# Patient Record
Sex: Male | Born: 1965 | Race: White | Hispanic: No | Smoking: Current every day smoker
Health system: Southern US, Community
[De-identification: ages and names within clinical notes are randomized; demographics above are authoritative.]

## PROBLEM LIST (undated history)

## (undated) ENCOUNTER — Emergency Department (HOSPITAL_COMMUNITY): Discharge status: Absent without leave | Payer: Self-pay

## (undated) DIAGNOSIS — I209 Angina pectoris, unspecified: Secondary | ICD-10-CM

## (undated) DIAGNOSIS — I471 Supraventricular tachycardia, unspecified: Secondary | ICD-10-CM

## (undated) DIAGNOSIS — M109 Gout, unspecified: Secondary | ICD-10-CM

## (undated) DIAGNOSIS — Z720 Tobacco use: Secondary | ICD-10-CM

## (undated) DIAGNOSIS — M1990 Unspecified osteoarthritis, unspecified site: Secondary | ICD-10-CM

## (undated) DIAGNOSIS — I2510 Atherosclerotic heart disease of native coronary artery without angina pectoris: Secondary | ICD-10-CM

## (undated) DIAGNOSIS — I10 Essential (primary) hypertension: Secondary | ICD-10-CM

## (undated) DIAGNOSIS — R51 Headache: Secondary | ICD-10-CM

## (undated) DIAGNOSIS — F419 Anxiety disorder, unspecified: Secondary | ICD-10-CM

## (undated) DIAGNOSIS — K219 Gastro-esophageal reflux disease without esophagitis: Secondary | ICD-10-CM

## (undated) DIAGNOSIS — I4891 Unspecified atrial fibrillation: Secondary | ICD-10-CM

## (undated) DIAGNOSIS — G894 Chronic pain syndrome: Secondary | ICD-10-CM

## (undated) DIAGNOSIS — R0602 Shortness of breath: Secondary | ICD-10-CM

## (undated) DIAGNOSIS — N200 Calculus of kidney: Secondary | ICD-10-CM

## (undated) DIAGNOSIS — F1010 Alcohol abuse, uncomplicated: Secondary | ICD-10-CM

## (undated) HISTORY — DX: Supraventricular tachycardia, unspecified: I47.10

## (undated) HISTORY — DX: Alcohol abuse, uncomplicated: F10.10

## (undated) HISTORY — DX: Tobacco use: Z72.0

## (undated) HISTORY — DX: Supraventricular tachycardia: I47.1

## (undated) HISTORY — PX: KNEE ARTHROPLASTY: SHX992

---

## 2006-03-22 ENCOUNTER — Emergency Department (HOSPITAL_COMMUNITY): Admission: EM | Admit: 2006-03-22 | Discharge: 2006-03-22 | Payer: Self-pay | Admitting: Emergency Medicine

## 2007-08-08 ENCOUNTER — Emergency Department (HOSPITAL_COMMUNITY): Admission: EM | Admit: 2007-08-08 | Discharge: 2007-08-08 | Payer: Self-pay | Admitting: Emergency Medicine

## 2009-05-19 ENCOUNTER — Inpatient Hospital Stay (HOSPITAL_COMMUNITY): Admission: EM | Admit: 2009-05-19 | Discharge: 2009-05-22 | Payer: Self-pay | Admitting: Emergency Medicine

## 2009-05-19 ENCOUNTER — Ambulatory Visit: Payer: Self-pay | Admitting: Internal Medicine

## 2009-05-20 ENCOUNTER — Encounter (INDEPENDENT_AMBULATORY_CARE_PROVIDER_SITE_OTHER): Payer: Self-pay | Admitting: Interventional Cardiology

## 2009-05-20 ENCOUNTER — Encounter: Payer: Self-pay | Admitting: Internal Medicine

## 2009-05-24 ENCOUNTER — Emergency Department (HOSPITAL_COMMUNITY): Admission: EM | Admit: 2009-05-24 | Discharge: 2009-05-24 | Payer: Self-pay | Admitting: Emergency Medicine

## 2009-06-17 DIAGNOSIS — F1010 Alcohol abuse, uncomplicated: Secondary | ICD-10-CM | POA: Insufficient documentation

## 2009-07-08 ENCOUNTER — Encounter (INDEPENDENT_AMBULATORY_CARE_PROVIDER_SITE_OTHER): Payer: Self-pay | Admitting: *Deleted

## 2009-11-10 ENCOUNTER — Emergency Department (HOSPITAL_COMMUNITY): Admission: EM | Admit: 2009-11-10 | Discharge: 2009-11-10 | Payer: Self-pay | Admitting: Family Medicine

## 2010-03-15 ENCOUNTER — Emergency Department (HOSPITAL_COMMUNITY): Admission: EM | Admit: 2010-03-15 | Discharge: 2010-03-15 | Payer: Self-pay | Admitting: Family Medicine

## 2010-03-18 ENCOUNTER — Emergency Department (HOSPITAL_COMMUNITY): Admission: EM | Admit: 2010-03-18 | Discharge: 2010-03-18 | Payer: Self-pay | Admitting: Emergency Medicine

## 2010-06-30 ENCOUNTER — Inpatient Hospital Stay (HOSPITAL_COMMUNITY): Admission: EM | Admit: 2010-06-30 | Discharge: 2010-07-02 | Payer: Self-pay | Admitting: Emergency Medicine

## 2010-06-30 ENCOUNTER — Ambulatory Visit: Payer: Self-pay | Admitting: Internal Medicine

## 2010-06-30 ENCOUNTER — Encounter (INDEPENDENT_AMBULATORY_CARE_PROVIDER_SITE_OTHER): Payer: Self-pay | Admitting: Interventional Cardiology

## 2010-07-01 HISTORY — PX: CARDIAC CATHETERIZATION: SHX172

## 2010-08-13 ENCOUNTER — Encounter (INDEPENDENT_AMBULATORY_CARE_PROVIDER_SITE_OTHER): Payer: Self-pay | Admitting: *Deleted

## 2010-08-19 ENCOUNTER — Ambulatory Visit: Payer: Self-pay | Admitting: Internal Medicine

## 2010-08-21 ENCOUNTER — Inpatient Hospital Stay (HOSPITAL_COMMUNITY): Admission: EM | Admit: 2010-08-21 | Discharge: 2010-08-23 | Payer: Self-pay | Admitting: Emergency Medicine

## 2010-08-26 ENCOUNTER — Telehealth: Payer: Self-pay | Admitting: Internal Medicine

## 2010-08-27 ENCOUNTER — Encounter: Payer: Self-pay | Admitting: Internal Medicine

## 2010-08-30 ENCOUNTER — Ambulatory Visit: Payer: Self-pay | Admitting: Internal Medicine

## 2010-08-30 LAB — CONVERTED CEMR LAB
BUN: 16 mg/dL (ref 6–23)
Basophils Absolute: 0.1 10*3/uL (ref 0.0–0.1)
Basophils Relative: 0.9 % (ref 0.0–3.0)
CO2: 25 meq/L (ref 19–32)
Calcium: 9.6 mg/dL (ref 8.4–10.5)
Chloride: 103 meq/L (ref 96–112)
Creatinine, Ser: 1.2 mg/dL (ref 0.4–1.5)
Eosinophils Absolute: 0.1 10*3/uL (ref 0.0–0.7)
Eosinophils Relative: 1 % (ref 0.0–5.0)
GFR calc non Af Amer: 71.23 mL/min (ref >60)
Glucose, Bld: 88 mg/dL (ref 70–99)
HCT: 45 % (ref 39.0–52.0)
Hemoglobin: 15.8 g/dL (ref 13.0–17.0)
INR: 0.9 (ref 0.8–1.0)
Lymphocytes Relative: 32.8 % (ref 12.0–46.0)
Lymphs Abs: 2.1 10*3/uL (ref 0.7–4.0)
MCHC: 35.2 g/dL (ref 30.0–36.0)
MCV: 95.4 fL (ref 78.0–100.0)
Monocytes Absolute: 0.4 10*3/uL (ref 0.1–1.0)
Monocytes Relative: 6.7 % (ref 3.0–12.0)
Neutro Abs: 3.7 10*3/uL (ref 1.4–7.7)
Neutrophils Relative %: 58.6 % (ref 43.0–77.0)
Platelets: 378 10*3/uL (ref 150.0–400.0)
Potassium: 4.8 meq/L (ref 3.5–5.1)
Prothrombin Time: 10 s (ref 9.7–11.8)
RBC: 4.71 M/uL (ref 4.22–5.81)
RDW: 12.8 % (ref 11.5–14.6)
Sodium: 138 meq/L (ref 135–145)
WBC: 6.3 10*3/uL (ref 4.5–10.5)
aPTT: 27.9 s (ref 21.7–28.8)

## 2010-08-31 ENCOUNTER — Telehealth: Payer: Self-pay | Admitting: Internal Medicine

## 2010-09-01 ENCOUNTER — Ambulatory Visit: Payer: Self-pay | Admitting: Internal Medicine

## 2010-09-01 ENCOUNTER — Observation Stay (HOSPITAL_COMMUNITY): Admission: RE | Admit: 2010-09-01 | Discharge: 2010-09-02 | Payer: Self-pay | Admitting: Internal Medicine

## 2010-09-06 ENCOUNTER — Telehealth: Payer: Self-pay | Admitting: Internal Medicine

## 2010-09-14 ENCOUNTER — Emergency Department (HOSPITAL_COMMUNITY): Admission: EM | Admit: 2010-09-14 | Discharge: 2010-09-14 | Payer: Self-pay | Admitting: Emergency Medicine

## 2010-10-05 ENCOUNTER — Encounter (HOSPITAL_COMMUNITY)
Admission: RE | Admit: 2010-10-05 | Discharge: 2010-12-18 | Payer: Self-pay | Source: Home / Self Care | Attending: Interventional Cardiology | Admitting: Interventional Cardiology

## 2010-11-25 ENCOUNTER — Inpatient Hospital Stay (HOSPITAL_COMMUNITY): Admission: EM | Admit: 2010-11-25 | Discharge: 2010-08-20 | Payer: Self-pay | Admitting: Emergency Medicine

## 2011-01-18 NOTE — Letter (Signed)
Summary: ELectrophysiology/Ablation Procedure Instructions  [facility], Main Office  [address]   [place], [place] [address]   Phone: [phone]  Fax: [phone]     Electrophysiology/Ablation Procedure Instructions    You are scheduled for a(n) SVT ablation  on 09/01/10 at 10:30am with Dr. Ladona Ridgel.  1.  Please come to the Short Stay Center at [facility] at 8:30am on the day of your procedure.  2.  Come prepared to stay overnight.   Please bring your insurance cards and a list of your medications.  3.  Come to the [place] office on 08/30/10 at 9:30am for lab work.    You do not have to be fasting.  4.  Do not have anything to eat or drink after midnight the night before your procedure.  5.   All of your remaining medications may be taken with a small amount of water.  6.  Educational material received:  Ablation   * Occasionally, EP studies and ablations can become lengthy.  Please make your family aware of this before your procedure starts.  Average time ranges from 2-8 hours for EP studies/ablations.  Your physician will locate your family after the procedure with the results.  * If you have any questions after you get home, please call the office at [phone]. Anselm Pancoast

## 2011-01-18 NOTE — Progress Notes (Signed)
Summary: re ablation date  Phone Note Call from Patient   Caller: Patient Reason for Call: Talk to Nurse Summary of Call: pt calling to see when ablation will be set up-pls call [phone] Initial call taken by: Glynda Jaeger,  August 26, 2010 8:52 AM  Follow-up for Phone Call        CATO/ICE Procedure on 09/01/10 at 10:30  Be at hospital at 8:00am labs on 08/30/10 pt aware Dennis Bast, RN, BSN  August 27, 2010 12:17 PM

## 2011-01-18 NOTE — Letter (Signed)
Summary: Appointment - Missed  [facility], Main Office  [address]   [place], [place] [address]   Phone: [phone]  Fax: [phone]     August 13, 2010 MRN: [id number]   Bobby Horn [address] [place], [place]  [address]   Dear [name],  Our records indicate you missed your appointment on August 17th, 2011 with Dr.       Johney Frame.  It is very important that we reach you to reschedule this appointment. We look forward to participating in your health care needs. Please contact us at the number listed above at your earliest convenience to reschedule this appointment.     Sincerely,    Glass blower/designer

## 2011-01-18 NOTE — Progress Notes (Signed)
Summary: test results  Phone Note Call from Patient Call back at Home Phone [phone] Call back at [phone]   Caller: Spouse- joyce Reason for Call: Talk to Nurse, Lab or Test Results Initial call taken by: Lorne Skeens,  August 31, 2010 11:55 AM  Follow-up for Phone Call        The pt and his wife were concerned about lab results. They wanted to make sure everything was ok before they went to the hospital tomorrow. I explained Kelly-RN for Dr. Ladona Ridgel had signed off. Everything looks ok for tomorrow.  Follow-up by: Sherri Rad, RN, BSN,  August 31, 2010 11:59 AM

## 2011-01-18 NOTE — Progress Notes (Signed)
Summary: having panic attacks/heart flutters  Phone Note Call from Patient   Caller: Patient Reason for Call: Talk to Nurse Summary of Call: pt calling re having a lot of panic attacks and heart fluttering pls advise [phone] Initial call taken by: Glynda Jaeger,  September 06, 2010 9:53 AM  Follow-up for Phone Call           pt c/ofluttering in chest and anxiety a week ago when Dr Ladona Ridgel was off.  He says this is going on several times a week and wants to talk to Dr Ladona Ridgel about it.  He signed himself out after his ablation AMA.  He is Dr Michaelle Copas pt for primary cardiology.  I told him I would talk with Dr Ladona Ridgel when he returned from vacation  09/13/10.  Pt verbalized understanding  Dennis Bast, RN  September 06, 2010 12:15 PM  pt calling back to check on this phone [address]  September 14, 2010 8:16 AM  pt calling back pt having chest pain Asher Muir Little  September 14, 2010 12:30 PM Spoke with pt and let him know I spoke with Dr Ladona Ridgel yesterday and he would be glad to see him for his fluttering.  However,  today he is c/o CP  he says he feels like he has a weight sitting on his chest.  I instructed him to go to the nearest ER and let Dr. Katrinka Blazing know who is his primary Cardiologist.  Pt understood Dennis Bast, RN, BSN  September 14, 2010 12:47 PM

## 2011-03-03 LAB — PROTIME-INR
INR: 0.98 (ref 0.00–1.49)
INR: 0.99 (ref 0.00–1.49)
Prothrombin Time: 13.2 seconds (ref 11.6–15.2)
Prothrombin Time: 13.3 seconds (ref 11.6–15.2)

## 2011-03-03 LAB — COMPREHENSIVE METABOLIC PANEL
ALT: 32 U/L (ref 0–53)
AST: 24 U/L (ref 0–37)
Albumin: 4.3 g/dL (ref 3.5–5.2)
Alkaline Phosphatase: 57 U/L (ref 39–117)
BUN: 19 mg/dL (ref 6–23)
BUN: 16 mg/dL (ref 6–23)
CO2: 24 mEq/L (ref 19–32)
CO2: 25 mEq/L (ref 19–32)
Calcium: 9.1 mg/dL (ref 8.4–10.5)
Calcium: 8.2 mg/dL — ABNORMAL LOW (ref 8.4–10.5)
Chloride: 109 mEq/L (ref 96–112)
Creatinine, Ser: 1.31 mg/dL (ref 0.4–1.5)
Creatinine, Ser: 1.04 mg/dL (ref 0.4–1.5)
GFR calc Af Amer: >60 mL/min (ref >60)
GFR calc non Af Amer: 59 mL/min — ABNORMAL LOW (ref >60)
GFR calc non Af Amer: >60 mL/min (ref >60)
Glucose, Bld: 102 mg/dL — ABNORMAL HIGH (ref 70–99)
Glucose, Bld: 92 mg/dL (ref 70–99)
Potassium: 3.8 mEq/L (ref 3.5–5.1)
Sodium: 140 mEq/L (ref 135–145)
Total Bilirubin: 0.5 mg/dL (ref 0.3–1.2)
Total Protein: 7.4 g/dL (ref 6.0–8.3)
Total Protein: 6 g/dL (ref 6.0–8.3)

## 2011-03-03 LAB — CARDIAC PANEL(CRET KIN+CKTOT+MB+TROPI)
CK, MB: 1.9 ng/mL (ref 0.3–4.0)
Relative Index: 1.7 (ref 0.0–2.5)
Total CK: 103 U/L (ref 7–232)
Troponin I: 0.02 ng/mL (ref 0.00–0.06)

## 2011-03-03 LAB — DIFFERENTIAL
Basophils Absolute: 0 10*3/uL (ref 0.0–0.1)
Basophils Relative: 0 % (ref 0–1)
Eosinophils Absolute: 0.1 10*3/uL (ref 0.0–0.7)
Eosinophils Relative: 1 % (ref 0–5)
Lymphocytes Relative: 26 % (ref 12–46)
Lymphs Abs: 2.9 10*3/uL (ref 0.7–4.0)
Monocytes Absolute: 0.8 10*3/uL (ref 0.1–1.0)
Monocytes Relative: 7 % (ref 3–12)
Neutro Abs: 7.5 10*3/uL (ref 1.7–7.7)
Neutrophils Relative %: 66 % (ref 43–77)

## 2011-03-03 LAB — RAPID URINE DRUG SCREEN, HOSP PERFORMED
Amphetamines: NOT DETECTED
Amphetamines: NOT DETECTED
Barbiturates: NOT DETECTED
Benzodiazepines: POSITIVE — AB
Benzodiazepines: POSITIVE — AB
Cocaine: NOT DETECTED
Cocaine: NOT DETECTED
Opiates: POSITIVE — AB
Tetrahydrocannabinol: NOT DETECTED
Tetrahydrocannabinol: NOT DETECTED

## 2011-03-03 LAB — CK TOTAL AND CKMB (NOT AT ARMC)
CK, MB: 1.3 ng/mL (ref 0.3–4.0)
CK, MB: 2.1 ng/mL (ref 0.3–4.0)
Relative Index: 1.5 (ref 0.0–2.5)
Relative Index: INVALID (ref 0.0–2.5)
Total CK: 137 U/L (ref 7–232)
Total CK: 70 U/L (ref 7–232)

## 2011-03-03 LAB — URINALYSIS, ROUTINE W REFLEX MICROSCOPIC
Bilirubin Urine: NEGATIVE
Glucose, UA: NEGATIVE mg/dL
Hgb urine dipstick: NEGATIVE
Ketones, ur: NEGATIVE mg/dL
Nitrite: NEGATIVE
Protein, ur: NEGATIVE mg/dL
Specific Gravity, Urine: 1.035 — ABNORMAL HIGH (ref 1.005–1.030)
Urobilinogen, UA: 0.2 mg/dL (ref 0.0–1.0)
pH: 5 (ref 5.0–8.0)

## 2011-03-03 LAB — CBC
HCT: 42.6 % (ref 39.0–52.0)
HCT: 40.1 % (ref 39.0–52.0)
HCT: 43.3 % (ref 39.0–52.0)
Hemoglobin: 15 g/dL (ref 13.0–17.0)
Hemoglobin: 14 g/dL (ref 13.0–17.0)
Hemoglobin: 15.7 g/dL (ref 13.0–17.0)
MCH: 32.7 pg (ref 26.0–34.0)
MCH: 33.3 pg (ref 26.0–34.0)
MCH: 33.5 pg (ref 26.0–34.0)
MCH: 34.1 pg — ABNORMAL HIGH (ref 26.0–34.0)
MCHC: 35.2 g/dL (ref 30.0–36.0)
MCHC: 34.9 g/dL (ref 30.0–36.0)
MCHC: 35.5 g/dL (ref 30.0–36.0)
MCHC: 36.3 g/dL — ABNORMAL HIGH (ref 30.0–36.0)
MCV: 92.8 fL (ref 78.0–100.0)
MCV: 93.9 fL (ref 78.0–100.0)
MCV: 94.3 fL (ref 78.0–100.0)
MCV: 95.2 fL (ref 78.0–100.0)
Platelets: 245 10*3/uL (ref 150–400)
Platelets: 172 10*3/uL (ref 150–400)
Platelets: 221 10*3/uL (ref 150–400)
RBC: 4.59 MIL/uL (ref 4.22–5.81)
RBC: 4.6 MIL/uL (ref 4.22–5.81)
RBC: 4.61 MIL/uL (ref 4.22–5.81)
RDW: 12.9 % (ref 11.5–15.5)
RDW: 12.6 % (ref 11.5–15.5)
RDW: 12.8 % (ref 11.5–15.5)
WBC: 6.5 10*3/uL (ref 4.0–10.5)
WBC: 11.3 10*3/uL — ABNORMAL HIGH (ref 4.0–10.5)

## 2011-03-03 LAB — POCT CARDIAC MARKERS
CKMB, poc: <1 ng/mL — ABNORMAL LOW (ref 1.0–8.0)
CKMB, poc: 1.2 ng/mL (ref 1.0–8.0)
CKMB, poc: <1 ng/mL — ABNORMAL LOW (ref 1.0–8.0)
Myoglobin, poc: 75.7 ng/mL (ref 12–200)
Myoglobin, poc: 54.7 ng/mL (ref 12–200)
Myoglobin, poc: 68.3 ng/mL (ref 12–200)
Myoglobin, poc: 73.6 ng/mL (ref 12–200)
Troponin i, poc: <0.05 ng/mL (ref 0.00–0.09)
Troponin i, poc: <0.05 ng/mL (ref 0.00–0.09)
Troponin i, poc: <0.05 ng/mL (ref 0.00–0.09)
Troponin i, poc: <0.05 ng/mL (ref 0.00–0.09)

## 2011-03-03 LAB — URIC ACID: Uric Acid, Serum: 4.9 mg/dL (ref 4.0–7.8)

## 2011-03-03 LAB — BASIC METABOLIC PANEL
BUN: 11 mg/dL (ref 6–23)
BUN: 19 mg/dL (ref 6–23)
CO2: 25 mEq/L (ref 19–32)
CO2: 25 mEq/L (ref 19–32)
Calcium: 8.9 mg/dL (ref 8.4–10.5)
Calcium: 9 mg/dL (ref 8.4–10.5)
Chloride: 107 mEq/L (ref 96–112)
Chloride: 109 mEq/L (ref 96–112)
Creatinine, Ser: 0.88 mg/dL (ref 0.4–1.5)
Creatinine, Ser: 1.54 mg/dL — ABNORMAL HIGH (ref 0.4–1.5)
GFR calc Af Amer: 60 mL/min — ABNORMAL LOW (ref >60)
GFR calc Af Amer: >60 mL/min (ref >60)
GFR calc non Af Amer: 49 mL/min — ABNORMAL LOW (ref >60)
Glucose, Bld: 108 mg/dL — ABNORMAL HIGH (ref 70–99)
Potassium: 4.2 mEq/L (ref 3.5–5.1)
Sodium: 139 mEq/L (ref 135–145)

## 2011-03-03 LAB — POCT I-STAT, CHEM 8
BUN: 14 mg/dL (ref 6–23)
Calcium, Ion: 1.13 mmol/L (ref 1.12–1.32)
Chloride: 109 mEq/L (ref 96–112)
Glucose, Bld: 86 mg/dL (ref 70–99)
HCT: 47 % (ref 39.0–52.0)
TCO2: 24 mmol/L (ref 0–100)

## 2011-03-03 LAB — BRAIN NATRIURETIC PEPTIDE: Pro B Natriuretic peptide (BNP): <30 pg/mL (ref 0.0–100.0)

## 2011-03-03 LAB — TROPONIN I
Troponin I: 0.01 ng/mL (ref 0.00–0.06)
Troponin I: 0.02 ng/mL (ref 0.00–0.06)

## 2011-03-03 LAB — MRSA PCR SCREENING: MRSA by PCR: NEGATIVE

## 2011-03-03 LAB — WOUND CULTURE

## 2011-03-03 LAB — MAGNESIUM: Magnesium: 2.1 mg/dL (ref 1.5–2.5)

## 2011-03-05 LAB — CARDIAC PANEL(CRET KIN+CKTOT+MB+TROPI)
CK, MB: 5.6 ng/mL — ABNORMAL HIGH (ref 0.3–4.0)
Total CK: 133 U/L (ref 7–232)
Total CK: 212 U/L (ref 7–232)
Troponin I: 0.78 ng/mL (ref 0.00–0.06)

## 2011-03-06 LAB — CBC
HCT: 54.1 % — ABNORMAL HIGH (ref 39.0–52.0)
MCH: 33.5 pg (ref 26.0–34.0)
MCV: 96.4 fL (ref 78.0–100.0)
Platelets: 266 10*3/uL (ref 150–400)
RDW: 12.9 % (ref 11.5–15.5)

## 2011-03-06 LAB — DIFFERENTIAL
Basophils Absolute: 0.1 10*3/uL (ref 0.0–0.1)
Basophils Relative: 1 % (ref 0–1)
Eosinophils Relative: 2 % (ref 0–5)
Lymphocytes Relative: 37 % (ref 12–46)

## 2011-03-06 LAB — COMPREHENSIVE METABOLIC PANEL
AST: 24 U/L (ref 0–37)
Alkaline Phosphatase: 60 U/L (ref 39–117)
BUN: 15 mg/dL (ref 6–23)
CO2: 20 mEq/L (ref 19–32)
Chloride: 108 mEq/L (ref 96–112)
Creatinine, Ser: 1.67 mg/dL — ABNORMAL HIGH (ref 0.4–1.5)
GFR calc Af Amer: 55 mL/min — ABNORMAL LOW (ref >60)
GFR calc non Af Amer: 45 mL/min — ABNORMAL LOW (ref >60)
Potassium: 4 mEq/L (ref 3.5–5.1)
Total Bilirubin: 0.7 mg/dL (ref 0.3–1.2)

## 2011-03-06 LAB — TROPONIN I: Troponin I: 0.02 ng/mL (ref 0.00–0.06)

## 2011-03-06 LAB — CARDIAC PANEL(CRET KIN+CKTOT+MB+TROPI)
Relative Index: INVALID (ref 0.0–2.5)
Total CK: 86 U/L (ref 7–232)

## 2011-03-06 LAB — BASIC METABOLIC PANEL
CO2: 23 mEq/L (ref 19–32)
Chloride: 106 mEq/L (ref 96–112)
GFR calc Af Amer: >60 mL/min (ref >60)
Potassium: 4 mEq/L (ref 3.5–5.1)
Sodium: 134 mEq/L — ABNORMAL LOW (ref 135–145)

## 2011-03-28 LAB — POCT I-STAT, CHEM 8
Glucose, Bld: 91 mg/dL (ref 70–99)
HCT: 44 % (ref 39.0–52.0)
Hemoglobin: 15 g/dL (ref 13.0–17.0)
Potassium: 4.5 mEq/L (ref 3.5–5.1)
Sodium: 140 mEq/L (ref 135–145)

## 2011-03-28 LAB — BASIC METABOLIC PANEL
BUN: 20 mg/dL (ref 6–23)
CO2: 22 mEq/L (ref 19–32)
Calcium: 10.2 mg/dL (ref 8.4–10.5)
Calcium: 9 mg/dL (ref 8.4–10.5)
Chloride: 111 mEq/L (ref 96–112)
Creatinine, Ser: 1.1 mg/dL (ref 0.4–1.5)
GFR calc Af Amer: 47 mL/min — ABNORMAL LOW (ref >60)
GFR calc Af Amer: >60 mL/min (ref >60)
GFR calc non Af Amer: 39 mL/min — ABNORMAL LOW (ref >60)
GFR calc non Af Amer: >60 mL/min (ref >60)
Glucose, Bld: 90 mg/dL (ref 70–99)
Potassium: 4.1 mEq/L (ref 3.5–5.1)
Sodium: 140 mEq/L (ref 135–145)
Sodium: 144 mEq/L (ref 135–145)

## 2011-03-28 LAB — T4, FREE: Free T4: 0.83 ng/dL (ref 0.80–1.80)

## 2011-03-28 LAB — CBC
Hemoglobin: 16.8 g/dL (ref 13.0–17.0)
MCHC: 34.9 g/dL (ref 30.0–36.0)
MCV: 94.7 fL (ref 78.0–100.0)
RBC: 5.14 MIL/uL (ref 4.22–5.81)
RDW: 12.8 % (ref 11.5–15.5)
RDW: 12.9 % (ref 11.5–15.5)

## 2011-03-28 LAB — DIFFERENTIAL
Basophils Absolute: 0 10*3/uL (ref 0.0–0.1)
Basophils Absolute: 0 10*3/uL (ref 0.0–0.1)
Basophils Relative: 0 % (ref 0–1)
Eosinophils Absolute: 0.1 10*3/uL (ref 0.0–0.7)
Eosinophils Relative: 1 % (ref 0–5)
Lymphocytes Relative: 21 % (ref 12–46)
Lymphocytes Relative: 23 % (ref 12–46)
Lymphs Abs: 1.7 10*3/uL (ref 0.7–4.0)
Monocytes Absolute: 0.5 10*3/uL (ref 0.1–1.0)
Monocytes Absolute: 0.5 10*3/uL (ref 0.1–1.0)
Monocytes Relative: 7 % (ref 3–12)
Neutro Abs: 5.1 10*3/uL (ref 1.7–7.7)
Neutro Abs: 7.4 10*3/uL (ref 1.7–7.7)
Neutrophils Relative %: 69 % (ref 43–77)
Neutrophils Relative %: 74 % (ref 43–77)

## 2011-03-28 LAB — POCT CARDIAC MARKERS
CKMB, poc: 1.2 ng/mL (ref 1.0–8.0)
CKMB, poc: 1.6 ng/mL (ref 1.0–8.0)
Myoglobin, poc: 340 ng/mL (ref 12–200)
Troponin i, poc: <0.05 ng/mL (ref 0.00–0.09)

## 2011-03-28 LAB — CARDIAC PANEL(CRET KIN+CKTOT+MB+TROPI)
CK, MB: 8.4 ng/mL — ABNORMAL HIGH (ref 0.3–4.0)
CK, MB: 8.5 ng/mL — ABNORMAL HIGH (ref 0.3–4.0)
Relative Index: 5.2 — ABNORMAL HIGH (ref 0.0–2.5)
Relative Index: 5.5 — ABNORMAL HIGH (ref 0.0–2.5)
Total CK: 154 U/L (ref 7–232)
Total CK: 164 U/L (ref 7–232)
Troponin I: 0.49 ng/mL — ABNORMAL HIGH (ref 0.00–0.06)

## 2011-03-28 LAB — PROTIME-INR
INR: 0.9 (ref 0.00–1.49)
INR: 1 (ref 0.00–1.49)
Prothrombin Time: 13.5 seconds (ref 11.6–15.2)

## 2011-03-28 LAB — APTT: aPTT: 23 seconds — ABNORMAL LOW (ref 24–37)

## 2011-03-28 LAB — RAPID URINE DRUG SCREEN, HOSP PERFORMED
Barbiturates: NOT DETECTED
Benzodiazepines: POSITIVE — AB

## 2011-03-28 LAB — CK TOTAL AND CKMB (NOT AT ARMC)
CK, MB: 4.4 ng/mL — ABNORMAL HIGH (ref 0.3–4.0)
Total CK: 165 U/L (ref 7–232)

## 2011-03-28 LAB — TROPONIN I: Troponin I: 0.09 ng/mL — ABNORMAL HIGH (ref 0.00–0.06)

## 2011-04-19 DIAGNOSIS — N200 Calculus of kidney: Secondary | ICD-10-CM

## 2011-04-19 HISTORY — DX: Calculus of kidney: N20.0

## 2011-05-03 NOTE — Op Note (Signed)
NAME:  Bobby Horn, Bobby Horn NO.:  [id number]   MEDICAL RECORD NO.:  [id number]          PATIENT TYPE:  INP   LOCATION:  2917                         FACILITY:  [facility]   PHYSICIAN:  Duke Salvia, MD, FACCDATE OF BIRTH:  1966-04-18   DATE OF PROCEDURE:  05/21/2009  DATE OF DISCHARGE:                               OPERATIVE REPORT   PREOPERATIVE DIAGNOSIS:  Recurrent supraventricular tachycardia with a  left bundle-branch block morphology.   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   PROCEDURES:  Invasive electrophysiological study, arrhythmia mapping,  catheter ablation, isoproterenol infusion, and ibutilide infusion.   DESCRIPTION OF PROCEDURE:  Following obtaining informed consent, the  patient was brought to the electrophysiology laboratory and placed on  the fluoroscopic table in the supine position.  After routine prep and  drape, cardiac catheterization was performed with local anesthesia and  conscious sedation.  Noninvasive blood pressure monitoring and  transcutaneous oxygen saturation monitoring were performed continuously  throughout the procedure.  Following the procedure, the catheters were  removed, and hemostasis was obtained.  The patient was transferred to  the holding area in stable condition.   CATHETERS:  A 5-French quadripolar catheter was inserted via the left  femoral vein to the AV junction.  A 5-French quadripolar catheter was inserted via the left femoral vein  to the right ventricular apex.  A 6-French octapolar catheter was inserted via the right femoral vein to  the coronary sinus.  A 7-French 5-mm deflectable tip ablation catheter was inserted via the  right femoral artery to mapping sites in the mitral annulus both on the  ventricular side as well as the atrial side.   Surface leads I, aVF, and V1 were monitored continuously  throughout the  procedure.  Following insertion of the catheters, a stimulation protocol  included:  Incremental atrial pacing.  Incremental ventricular pacing.  Single atrial extrastimuli at paced cycle lengths of 500 msec.  Single and double ventricular extrastimuli paced cycle length of 500  msec and single ventricular extrastimuli paced cycle length of 400 msec.  Ventricular pacing during tachycardia.   END-TIDAL RESULTS:  End-tidal surface electrocardiogram  Initial:  Rhythm:  Sinus; RR interval 942 msec; PR interval 152 msec; QRS duration  107 msec; QT interval 465 msec; P wave duration 134 msec; bundle-branch  block:  Absent; pre-excitation:  Absent.  AH interval 60 msec; HV interval 50 msec.  Final:  Rhythm:  Sinus; RR interval 759 msec; PR interval:  126 msec; QRS  duration 142 msec; QT interval 474 msec; P wave duration 107 msec; pre-  excitation:  Absent; bundle-branch block:  Present - right;  AH interval:  70 msec; HV interval:  53 msec.  AV nodal function:  AV Wenckebach was 350 msec.  VA Wenckebach was 400 msec with refractoriness of the accessory pathway  seen below that.  AV nodal conduction was continuous retrograde preablation.   Accessory pathway function; the left lateral accessory pathway just at  the site 6-8 was identified.  It was not initially  apparent with  ventricular pacing and was seen initially only upon refractoriness of  the AV node at 400 msec.  Isoproterenol infusion allowed [place] to induce  and sustain tachycardia.  The patient was also mapped during ventricular  pacing of the atrial insertion.   Tachycardia when induced had a cycle length of 400-427 msec and was  inducible with coronary sinus pacing at 500:  300 - 280 msec.  It  terminated spontaneously.   1. Arrhythmia.  2. Paroxysmal atrial fibrillation occurred on 2 occasions, both during      ventricular pacing and atrial insertion mapping.  After persistence      of greater than 10  minutes, ibutilide was given on 2 separate      occasions, which resulted in termination of atrial fibrillation on      the first occasion in the post-infusion period and in the second      infusion, mid infusion.  The QTc interval during ibutilide      lengthened to 550 msec.   Fluoroscopy time, a total of 6 minutes and 21 seconds of fluoroscopy  time was utilized at 7.5 frames per second.   Radiofrequency energy, a total of 2 minutes and 13 seconds of RF was  applied to sites where atrial activation was quite early.  With the  fifth application, which was a 30-second application, VA conduction was  interrupted.  We then gave a 60-second consolidation burn at various  proximal sites prior to catheter removal.   IMPRESSION:  1. Normal sinus function.  2. Normal atrial function.  3. Normal atrioventricular nodal function.  4. Normal His-Purkinje system function.  5. Accessory pathway identified at left lateral just at the site 6-8      with successful interruption of retrograde conduction and      elimination of the substrate of the patient's arrhythmia.  6. Normal ventricular response to programmed stimulation.   SUMMARY:  In conclusion, the results of electrophysiological testing  demonstrated a left lateral accessory pathway mediated in the patient's  clinical arrhythmia.  Catheter ablation was successfully accomplished on  the atrial insertion.  The procedure was complicated by recurrent  episodes of atrial fibrillation requiring ibutilide infusion associated  with QT prolongation to the 550 msec range.  Because of that the patient  will be observed in the Step-Down Unit for 4 hours prior to transfer to  the floor.   The patient tolerated the procedure without apparent complications other  than that.      Duke Salvia, MD, [facility]  Electronically Signed    SCK/MEDQ  D:  05/21/2009  T:  05/21/2009  Job:  [id number]

## 2011-05-03 NOTE — Consult Note (Signed)
NAME:  Bobby Horn, Bobby Horn NO.:  [id number]   MEDICAL RECORD NO.:  [id number]          PATIENT TYPE:  INP   LOCATION:  2008                         FACILITY:  [facility]   PHYSICIAN:  Hillis Range, MD       DATE OF BIRTH:  1966-09-20   DATE OF CONSULTATION:  05/20/2009  DATE OF DISCHARGE:                                 CONSULTATION   REQUESTING PHYSICIAN:  Lyn Records, MD   REASON FOR CONSULTATION:  Wide complex tachycardia.   HISTORY OF PRESENT ILLNESS:  Bobby Horn is a 45 year old gentleman with  a history of supraventricular tachycardia who was admitted with  symptomatic wide complex tachycardia.  The patient reports that since  his childhood, he has had frequent episodes of abrupt onset and  termination of heart racing.  He describes a regular tachycardia with  symptoms of diaphoresis, dizziness, chest discomfort, and presyncope.  Episodes often terminate with vagal maneuvers.  He has previously tried  beta-blockers but was unable to tolerate these medications due to  fatigue.  He reports that episodes typically are precipitated by  exercise.  He is unaware of any other triggers for these episodes.  He  reports increasing frequency and duration of episodes recently.  Presently, episodes occur several times per week.  He notes that he has  previously been diagnosed with supraventricular tachycardia and has had  documented heart rates up to 200 beats per minute with symptoms of  presyncope.  His most recent episode occurred on May 19, 2009 at which  time he developed sustained tachycardia with dizziness and chest  discomfort.  He presented to [facility] Emergency Department where he  was observed to have a wide complex tachycardia of a left bundle-branch  morphology with a ventricular rate of 190 beats per minute.  He was  cardioverted to sinus rhythm.  He has subsequently done very well  without any symptoms of chest pain, shortness of breath, palpitations,  dizziness, presyncope, or syncope.  Presently, he is asymptomatic and  has baseline state of health.  He reports that he is typically very  active and works as a Designer, fashion/clothing.   PAST MEDICAL HISTORY:  1. Supraventricular tachycardia.  2. Alcohol abuse.  3. Tobacco abuse.   SOCIAL HISTORY:  The patient lives in [place] with his wife.  He  drinks a six-pack of beer most days.  He smokes 1 pack per day and  denies drug use.  He works as a Designer, fashion/clothing.   FAMILY HISTORY:  Notable for a mother with a irregular heartbeat.  The  patient is unaware of his father's family history.   ALLERGIES:  BETA-BLOCKERS cause fatigue and erectile dysfunction.  He  has no other allergies.   MEDICATIONS:  1. Aspirin 325 mg daily.  2. Diltiazem 60 mg q.6 h.  3. Xanax p.r.n.  4. Darvocet p.r.n.   REVIEW OF SYSTEMS:  All systems were reviewed and negative except as  outlined in the HPI above.   PHYSICAL EXAMINATION:  VITAL SIGNS:  Telemetry reveals sinus rhythm.  Blood pressure 141/89, heart rate 71, respirations 18,  and sats 100% on  room air.  GENERAL:  The patient is a well-appearing male in no acute distress.  He  is alert and oriented x3.  HEENT:  Normocephalic and atraumatic.  Sclerae clear.  Conjunctivae  pink.  Oropharynx clear.  NECK:  Supple.  No thyromegaly, JVD, or bruits.  LUNGS:  Clear to auscultation bilaterally.  HEART:  Regular rate and rhythm.  No murmurs, rubs, or gallops.  GI:  Soft, nontender, and nondistended.  Positive bowel sounds.  EXTREMITIES:  No clubbing, cyanosis, or edema.  NEUROLOGIC:  Cranial nerves II-XII are intact.  Strength and sensation  are intact.  SKIN:  The patient has multiple tattoos.  MUSCULOSKELETAL:  The patient is fit and muscular.  No deformity or  atrophy.  PSYCH:  Euthymic mood.  Full affect.   IMAGING STUDIES:  EKG today reveals sinus rhythm at 66 beats per minute  and is otherwise unremarkable.  EKG from May 19, 2009 at 12:18 reveals a  wide complex  tachycardia with a QRS duration of 142 milliseconds and a  left bundle-branch QRS morphology.   LABORATORY DATA:  INR 0.9.  Creatinine 1.1 and potassium 4.1.  Hematocrit 48 and platelets 232.  Magnesium 2.2.  TSH 0.295.  Peak  troponin 0.54.  CK-MB 8.5 and CK 164.   IMPRESSION:  Bobby Horn is a 45 year old gentleman with a history of  supraventricular tachycardia who was admitted with a wide complex  symptomatic tachycardia.  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  He has  previously failed medical therapy with beta-blockers which he did not  tolerate due to symptoms of fatigue and erectile dysfunction.  At this  point, he is very interested in catheter ablation for his tachycardia.  The patient had chest discomfort during tachycardia and mildly elevated  cardiac markers.  I suspect that his mild elevation in cardiac markers  is due to rapid ventricular rates for a prolonged period of time as well  as cardioversion.  A transthoracic echocardiogram has been ordered and  is pending.  If the patient has a preserved ejection fraction, then we  should proceed on to EP study and radiofrequency ablation for his  tachycardia.  However, should he have a depressed ejection fraction, I  think that cardiac catheterization may be warranted.  I have discussed  this with Dr. Katrinka Blazing who agrees with the plan.  His TSH is also mildly  depressed and we will therefore obtain a free T4.   PLAN:  Therapeutic strategies for tachycardia including both medicines  and catheter-based therapies were discussed in detail with the patient.  Risks, benefits, and alternatives to EP study and radiofrequency  ablation for his tachycardia were also discussed in detail.  The patient  understands that these risks include but are not limited to bleeding,  vascular damage, pericardial effusion with tamponade and perforation,  damage to the normal  conduction system requiring a pacemaker damage to  the valves, stroke, MI, and death.  The patient understands these risks  and wishes to proceed.  We will therefore schedule the patient for EP  study and radiofrequency ablation for his tachycardia at the next  available time.      Hillis Range, MD  Electronically Signed     JA/MEDQ  D:  05/20/2009  T:  05/21/2009  Job:  [id number]

## 2011-05-03 NOTE — Discharge Summary (Signed)
NAMEMarland Kitchen  Bobby Horn, Bobby Horn NO.:  [id number]   MEDICAL RECORD NO.:  [id number]          PATIENT TYPE:  INP   LOCATION:  2917                         FACILITY:  [facility]   PHYSICIAN:  Lyn Records, M.D.   DATE OF BIRTH:  11-17-1966   DATE OF ADMISSION:  05/19/2009  DATE OF DISCHARGE:  05/22/2009                               DISCHARGE SUMMARY   DISCHARGE DIAGNOSES:  1. Supraventricular tachycardia status post successful ablation on      May 21, 2009.  2. Alcohol use, cessation counseling, if no tobacco abuse, cessation      counseling.   HOSPITAL COURSE:  [name] is a 45 year old male patient with a  history of SVT in the past.  He said he saw Dr. Katrinka Blazing about 12 years ago  was treated with a beta-blocker.  He stopped it secondary to erectile  dysfunction.  He did not make a return appointment to go over options  and since that time he has had palpitations.  On the day of admission,  he became dizzy and had more palpitations.  He presented to the  emergency room and was found to be in a wide complex tachycardia.  He  was given Versed and cardioverted back to normal sinus rhythm.   Ultimately, we consulted Dr. Johney Frame of the EP Service and ultimately he  was successfully ablated on May 21, 2009.  He did have some atrial  fibrillation during the EP study but Dr. Johney Frame felt this was not  clinical atrial fib but more likely the results of the EP study.  He  only recommends 325 mg of aspirin daily for 4 weeks with the cessation  of alcohol and tobacco dependence.   On May 22, 2009, this patient was felt to be ready for discharge to  home.   DISCHARGE LABORATORY DATA:  A free T4 of 0.83, sodium 140, potassium  4.1, BUN 20, creatinine 1.1.  His troponin did increase to 0.54 but we  felt this was secondary to the defibrillation that he received in the  emergency room.  Urine drug screen was positive for opiates and  benzodiazepines.  Hemoglobin 16.8, hematocrit 48.3,  white count 10.0,  platelets 232.  Chest x-ray no active disease.   He is being discharged to home in stable but improved condition.   DISCHARGE MEDICATIONS:  1. Enteric-coated aspirin 325 mg a day for the next month.  2. He is to use nicotine patches and we have explained to him how to      buy these at a drug store.  3. He uses Xanax at home and may resume this if he needs to.   DIET:  He is to remain on a low-sodium heart-healthy diet.   DISCHARGE INSTRUCTIONS:  Increase activity slowly.  May shower/bathe.  No lifting for 2 weeks.  Clean puncture site gently with soap and water.  No scrubbing.   FOLLOWUP:  Follow up with Dr. Johney Frame on Thursday June 18, 2009 at 2:15  p.m.  Follow up with Dr. Dellia Cloud, nurse practitioner on June 04, 2009 at  2 p.m.Marland Kitchen  At this point, we need to probably go ahead and  schedule a stress Cardiolite for this man just to assure ourselves  that  his wide complex tachycardia was not ischemic in nature.      Guy Franco, P.A.      Lyn Records, M.D.  Electronically Signed    LB/MEDQ  D:  05/22/2009  T:  05/23/2009  Job:  [id number]   cc:   Hillis Range, MD

## 2011-05-03 NOTE — H&P (Signed)
NAME:  Bobby Horn, Bobby Horn NO.:  [id number]   MEDICAL RECORD NO.:  [id number]          PATIENT TYPE:  INP   LOCATION:  2008                         FACILITY:  [facility]   PHYSICIAN:  Lyn Records, M.D.   DATE OF BIRTH:  15-Feb-1966   DATE OF ADMISSION:  05/19/2009  DATE OF DISCHARGE:                              HISTORY & PHYSICAL   CHIEF COMPLAINT:  Dizziness.   HISTORY OF PRESENT ILLNESS:  Mr. Goar is a 45 year old male patient  with a history of SVT in the past.  He saw Dr. Katrinka Blazing about 12 years ago  for the same, he was treated with a beta-blocker, with the patient has  stopped secondary to erectile dysfunction.  He did not make a return  appointment to go over any further options.  Since that time, he has had  palpitations associated with dizziness.  The symptoms were worse today.  He was brought to the emergency room, was found to be a wide complex  tachycardia and was given Versed and was cardioverted back to normal  sinus rhythm.  He says he does not want to be on beta-blockers.  He has  some chest pain isolated with SVT episodes.   PAST MEDICAL HISTORY:  SVT.   SOCIAL HISTORY:  He lives in [place] with his wife.  He drinks a 6-  pack of beer daily.  Smokes 1 pack per day.  Denies drug use.   FAMILY HISTORY:  Mother had coronary artery disease.   REVIEW OF SYSTEMS:  Presyncope, chest pain, or palpitations.  Review of  systems otherwise negative.   ALLERGIES:  No known drug allergies.   MEDICATIONS:  1. Xanax p.r.n.  2. Aspirin daily.   PHYSICAL EXAMINATION:  VITAL SIGNS:  Pulse 64, respirations 16, blood  pressure 110/72, and O2 saturation is 100% on room air.  GENERAL:  In no acute distress.  HEENT:  Grossly normal.  NECK:  No carotid or subclavian bruits.  No JVD or thyromegaly.  Sclerae  are clear.  Conjunctive normal.  Nares without drainage.  CHEST:  Clear to auscultation bilaterally.  No wheezing or rhonchi.  HEART:  Regular rate and  rhythm.  No murmurs or rub.  ABDOMEN:  Good bowel sounds.  Nontender and nondistended.  No masses, no  bruits.  EXTREMITIES:  No peripheral edema.  SKIN:  Warm and dry.  NEUROLOGIC:  Cranial nerves II through XII grossly intact.  Normal mood  and affect.   Chest x-ray, no active disease.   LABORATORY STUDIES:  Hemoglobin 16.0, hematocrit 48.3, platelets 232,  white count 10.0.  Sodium 144, potassium 4.8, BUN 21, creatinine 1.92,  sodium 99, PT 23, PTT 12.6, INR 0.9, myoglobin 340 which is elevated,  but his troponin is normal.   EKG shows normal sinus rhythm, rate 80, non-acute.   ASSESSMENT AND PLAN:  1. Wide-complex tachycardia, suspect atrium origin.  He did have chest      pain with this episode, he seems to only have chest pain with his      palpitations.  We will go ahead and  place on short-acting calcium      channel blockers.  2. History of SVT.  3. Erectile dysfunction with beta-blockers.   We will admit him, check cardiac isoenzyme, TSH, and a magnesium level.  Check a 2-D echo.  He may ultimately need a stress Cardiolite here at  [facility] at some point.      Guy Franco, P.A.      Lyn Records, M.D.  Electronically Signed    LB/MEDQ  D:  05/19/2009  T:  05/20/2009  Job:  [id number]

## 2011-05-17 ENCOUNTER — Emergency Department (HOSPITAL_COMMUNITY)
Admission: EM | Admit: 2011-05-17 | Discharge: 2011-05-17 | Disposition: A | Discharge status: Home / Self Care | Payer: Self-pay | Attending: Emergency Medicine | Admitting: Emergency Medicine

## 2011-05-17 ENCOUNTER — Emergency Department (HOSPITAL_COMMUNITY): Payer: Self-pay

## 2011-05-17 DIAGNOSIS — N201 Calculus of ureter: Secondary | ICD-10-CM | POA: Insufficient documentation

## 2011-05-17 DIAGNOSIS — F411 Generalized anxiety disorder: Secondary | ICD-10-CM | POA: Insufficient documentation

## 2011-05-17 DIAGNOSIS — N1330 Unspecified hydronephrosis: Secondary | ICD-10-CM | POA: Insufficient documentation

## 2011-05-17 DIAGNOSIS — R112 Nausea with vomiting, unspecified: Secondary | ICD-10-CM | POA: Insufficient documentation

## 2011-05-17 DIAGNOSIS — R109 Unspecified abdominal pain: Secondary | ICD-10-CM | POA: Insufficient documentation

## 2011-05-17 DIAGNOSIS — R000 Tachycardia, unspecified: Secondary | ICD-10-CM | POA: Insufficient documentation

## 2011-05-17 DIAGNOSIS — I4891 Unspecified atrial fibrillation: Secondary | ICD-10-CM | POA: Insufficient documentation

## 2011-05-17 DIAGNOSIS — IMO0002 Reserved for concepts with insufficient information to code with codable children: Secondary | ICD-10-CM | POA: Insufficient documentation

## 2011-05-17 LAB — URINALYSIS, ROUTINE W REFLEX MICROSCOPIC
Bilirubin Urine: NEGATIVE
Glucose, UA: NEGATIVE mg/dL
Specific Gravity, Urine: 1.034 — ABNORMAL HIGH (ref 1.005–1.030)
Urobilinogen, UA: 0.2 mg/dL (ref 0.0–1.0)

## 2011-05-17 LAB — URINE MICROSCOPIC-ADD ON

## 2011-06-28 ENCOUNTER — Encounter: Payer: Self-pay | Admitting: Internal Medicine

## 2011-09-30 LAB — I-STAT 8, (EC8 V) (CONVERTED LAB)
BUN: 25 — ABNORMAL HIGH
Bicarbonate: 23.7
Glucose, Bld: 112 — ABNORMAL HIGH
Sodium: 134 — ABNORMAL LOW
pCO2, Ven: 36.4 — ABNORMAL LOW
pH, Ven: 7.421 — ABNORMAL HIGH

## 2011-09-30 LAB — DIFFERENTIAL
Basophils Absolute: 0.1
Basophils Relative: 1
Eosinophils Absolute: 0.1
Lymphocytes Relative: 14
Lymphs Abs: 1.4
Monocytes Relative: 8
Neutro Abs: 7.7
Neutrophils Relative %: 77

## 2011-09-30 LAB — CBC
RBC: 5.1
WBC: 10.1

## 2011-11-04 ENCOUNTER — Telehealth (HOSPITAL_COMMUNITY): Payer: Self-pay | Admitting: Emergency Medicine

## 2011-11-04 ENCOUNTER — Other Ambulatory Visit: Payer: Self-pay

## 2011-11-04 ENCOUNTER — Encounter (HOSPITAL_COMMUNITY): Payer: Self-pay | Admitting: Emergency Medicine

## 2011-11-04 ENCOUNTER — Observation Stay (HOSPITAL_COMMUNITY)
Admission: EM | Admit: 2011-11-04 | Discharge: 2011-11-06 | Disposition: A | Discharge status: Home / Self Care | Payer: Self-pay | Attending: Internal Medicine | Admitting: Internal Medicine

## 2011-11-04 ENCOUNTER — Emergency Department (HOSPITAL_COMMUNITY): Payer: Self-pay

## 2011-11-04 DIAGNOSIS — R110 Nausea: Secondary | ICD-10-CM | POA: Insufficient documentation

## 2011-11-04 DIAGNOSIS — I10 Essential (primary) hypertension: Secondary | ICD-10-CM | POA: Diagnosis present

## 2011-11-04 DIAGNOSIS — R61 Generalized hyperhidrosis: Secondary | ICD-10-CM | POA: Insufficient documentation

## 2011-11-04 DIAGNOSIS — I471 Supraventricular tachycardia, unspecified: Secondary | ICD-10-CM | POA: Diagnosis present

## 2011-11-04 DIAGNOSIS — I214 Non-ST elevation (NSTEMI) myocardial infarction: Principal | ICD-10-CM

## 2011-11-04 DIAGNOSIS — R079 Chest pain, unspecified: Secondary | ICD-10-CM | POA: Diagnosis present

## 2011-11-04 DIAGNOSIS — I498 Other specified cardiac arrhythmias: Secondary | ICD-10-CM | POA: Insufficient documentation

## 2011-11-04 DIAGNOSIS — Z720 Tobacco use: Secondary | ICD-10-CM

## 2011-11-04 DIAGNOSIS — R0789 Other chest pain: Principal | ICD-10-CM | POA: Diagnosis present

## 2011-11-04 DIAGNOSIS — R0602 Shortness of breath: Secondary | ICD-10-CM | POA: Insufficient documentation

## 2011-11-04 DIAGNOSIS — G894 Chronic pain syndrome: Secondary | ICD-10-CM | POA: Diagnosis present

## 2011-11-04 DIAGNOSIS — F1010 Alcohol abuse, uncomplicated: Secondary | ICD-10-CM | POA: Insufficient documentation

## 2011-11-04 DIAGNOSIS — E876 Hypokalemia: Secondary | ICD-10-CM | POA: Insufficient documentation

## 2011-11-04 DIAGNOSIS — M109 Gout, unspecified: Secondary | ICD-10-CM | POA: Insufficient documentation

## 2011-11-04 HISTORY — DX: Angina pectoris, unspecified: I20.9

## 2011-11-04 HISTORY — DX: Anxiety disorder, unspecified: F41.9

## 2011-11-04 HISTORY — DX: Shortness of breath: R06.02

## 2011-11-04 HISTORY — DX: Calculus of kidney: N20.0

## 2011-11-04 HISTORY — DX: Essential (primary) hypertension: I10

## 2011-11-04 HISTORY — DX: Chronic pain syndrome: G89.4

## 2011-11-04 HISTORY — DX: Unspecified osteoarthritis, unspecified site: M19.90

## 2011-11-04 HISTORY — DX: Atherosclerotic heart disease of native coronary artery without angina pectoris: I25.10

## 2011-11-04 HISTORY — DX: Non-ST elevation (NSTEMI) myocardial infarction: I21.4

## 2011-11-04 HISTORY — DX: Headache: R51

## 2011-11-04 HISTORY — DX: Gastro-esophageal reflux disease without esophagitis: K21.9

## 2011-11-04 LAB — BASIC METABOLIC PANEL
BUN: 16 mg/dL (ref 6–23)
GFR calc non Af Amer: 81 mL/min — ABNORMAL LOW (ref >90)
Glucose, Bld: 115 mg/dL — ABNORMAL HIGH (ref 70–99)
Potassium: 3.4 mEq/L — ABNORMAL LOW (ref 3.5–5.1)

## 2011-11-04 LAB — CBC
HCT: 38.6 % — ABNORMAL LOW (ref 39.0–52.0)
Hemoglobin: 13.8 g/dL (ref 13.0–17.0)
MCHC: 35.8 g/dL (ref 30.0–36.0)
MCV: 92.8 fL (ref 78.0–100.0)

## 2011-11-04 MED ORDER — HYDROMORPHONE HCL PF 1 MG/ML IJ SOLN
1.0000 mg | Freq: Once | INTRAMUSCULAR | Status: AC
  Administered 2011-11-04: 1 mg via INTRAVENOUS
  Filled 2011-11-04: qty 1

## 2011-11-04 MED ORDER — MORPHINE SULFATE 4 MG/ML IJ SOLN
6.0000 mg | Freq: Once | INTRAMUSCULAR | Status: AC
  Administered 2011-11-04: 6 mg via INTRAVENOUS
  Filled 2011-11-04: qty 2

## 2011-11-04 MED ORDER — NITROGLYCERIN 0.4 MG SL SUBL
0.4000 mg | SUBLINGUAL_TABLET | SUBLINGUAL | Status: DC | PRN
  Administered 2011-11-04: 0.4 mg via SUBLINGUAL
  Filled 2011-11-04: qty 25

## 2011-11-04 MED ORDER — ASPIRIN 325 MG PO TABS: 325.0000 mg | ORAL_TABLET | ORAL | Status: DC

## 2011-11-04 MED ORDER — ASPIRIN EC 325 MG PO TBEC
DELAYED_RELEASE_TABLET | ORAL | Status: AC
  Administered 2011-11-04: 325 mg via ORAL
  Filled 2011-11-04: qty 1

## 2011-11-04 MED ORDER — ONDANSETRON HCL 4 MG/2ML IJ SOLN
4.0000 mg | Freq: Once | INTRAMUSCULAR | Status: AC
  Administered 2011-11-04: 4 mg via INTRAVENOUS
  Filled 2011-11-04: qty 2

## 2011-11-04 NOTE — ED Provider Notes (Signed)
History    patient with history of SVT and prior cardiac ablation is presenting to the ED with a chief complaint of chest pain and shortness of breath.  Patient states since the ablation nearly a year and a half ago, he has been having constant left-sided chest pain and shortness of breath with exertion. He described his chest pain is pressure, and describes shortness of breath with increase exertion.  He also complaining of nausea without vomiting. Also has occasional diaphoresis episode. Nothing makes it better or worse. Patient state he has talked to multiple doctors in regard to his symptoms, however he is not receiving adequate care " because I don't have insurance".  Today, his chest pain increase along with the shortness of breath without exertion.    CSN: [id number] Arrival date & time: 11/04/2011  5:04 PM   First MD Initiated Contact with Patient 11/04/11 1800      Chief Complaint  Patient presents with  . Chest Pain    (Consider location/radiation/quality/duration/timing/severity/associated sxs/prior treatment) HPI  Past Medical History  Diagnosis Date  . Supraventricular tachycardia   . Alcohol abuse   . Tobacco abuse     No past surgical history on file.  No family history on file.  History  Substance Use Topics  . Smoking status: Current Everyday Smoker  . Smokeless tobacco: Not on file   Comment: 1 pack per day  . Alcohol Use: Yes     6-pack beer most days      Review of Systems  All other systems reviewed and are negative.    Allergies  Review of patient's allergies indicates no known allergies.  Home Medications   Current Outpatient Rx  Name Route Sig Dispense Refill  . OXYCODONE-ACETAMINOPHEN 5-325 MG PO TABS Oral Take 1 tablet by mouth every 4 (four) hours as needed. For pain       BP 156/101  Pulse 90  Temp(Src) 98.8 F (37.1 C) (Oral)  Resp 22  SpO2 98%  Physical Exam  Nursing note and vitals reviewed. Constitutional: He appears  well-developed and well-nourished. He appears distressed.       Awake, alert, nontoxic appearance  HENT:  Head: Atraumatic.  Eyes: Right eye exhibits no discharge. Left eye exhibits no discharge.  Neck: Neck supple.  Cardiovascular: Normal rate and regular rhythm.   Pulmonary/Chest: Effort normal. No respiratory distress. He has no wheezes. He has no rales. He exhibits tenderness.       Chest tenderness throughout, worse to L chest on palpation.  No overlying skin changes or rash.    Abdominal: Soft. Bowel sounds are normal. There is no tenderness. There is no rebound.  Musculoskeletal: He exhibits no tenderness.       Baseline ROM, no obvious new focal weakness  Neurological:       Mental status and motor strength appears baseline for patient and situation  Skin: No rash noted.  Psychiatric: He has a normal mood and affect.    ED Course  Procedures (including critical care time)  Labs Reviewed  CBC - Abnormal; Notable for the following:    RBC 4.16 (*)    HCT 38.6 (*)    All other components within normal limits  POCT I-STAT TROPONIN I  I-STAT TROPONIN I  BASIC METABOLIC PANEL   No results found.   No diagnosis found.   Date: 11/04/2011  Rate: 91  Rhythm: normal sinus rhythm  QRS Axis: normal  Intervals: normal  ST/T Wave abnormalities: normal  Conduction  Disutrbances:none  Narrative Interpretation:   Old EKG Reviewed: unchanged    MDM  Patient presents with chest pain and shortness of breath worrisome for cardiac related etiology. He has a nuclear study of his heart on 10/05/10 which shows no acute finding.   8:21 PM Patient with reproducible chest pain on palpation. Pain is improved with pain medication. He has a normal EKG, negative troponin, and negative CBC and BMP. I will consult with his cardiologist to set up an outpatient followup per patient's request.  9:57 PM my attending, Dr. Adriana Simas, has seen and evaluated the patient and thought that he may need to  be admitted for further cardiac workup. I have consult with cardiology, Dr. Elita Boone.  The cardiologist believed that this is atypical chest pain, and recommend to consult with the hospitalist for admission.  10:42 PM I have consult with Dr. hospitalist, who has agreed to admit the patient for further cardiac workup. Patient will be admitted to triad Team 4, in a telemetry bed.    Fayrene Helper, PA 11/04/11 [phone]

## 2011-11-04 NOTE — ED Notes (Signed)
Pt to radiology.

## 2011-11-04 NOTE — ED Provider Notes (Signed)
Medical screening examination/treatment/procedure(s) were conducted as a shared visit with non-physician practitioner(s) and myself.  I personally evaluated the patient during the encounter.  Patient is status post cardiac ablation x2 in 2012. Has persistent chest pain worse with exertion. Has had trouble finding primary care and cardiology followup. Will admit to reevaluate cardiac status  Donnetta Hutching, MD 11/04/11 [phone]

## 2011-11-04 NOTE — ED Notes (Signed)
C/o squeezing pain across chest x 1 year.  States pain worse x 1 week with sob.  Cardiac history.

## 2011-11-04 NOTE — ED Notes (Signed)
Patient arrived in room PDA-9

## 2011-11-04 NOTE — H&P (Signed)
PCP:  Pt states doesn't have one now due to insruance, previously seeing Dr. Lennice Sites in Cardiology  Chief Complaint:  Chest soreness, malaise  HPI: 45yoM with longstanding h/o SVT s/p ablation in 05/2009, wide complext tachycardia and emergent cardioversion and s/p 2nd ablation 06/2010, chronic chest pain and ? narcotics seeking behavior presents with chest soreness x1 year, associated with SOB, lethargy and being "glazed over" and "out of it."   1.5 yr ago had a cardiac ablation for SVT, then developed L sided chest pain for the past year. Describes it as soreness in his left chest radiating to L flank and back, happens at least a couple times per week, and sometimes associated with SOB, worsened exercise tolerance and fatigue. Sensations come on if he's been working awhile or has gone a long distance, and improved after 30-60 mins of rest. However, sensations can come with rest. Wife states he looks "glazed over, looks out of it" and lethargic. He also states these sensations can be associated with palpitations and fast HR. No n/v but interesting states he can sometimes have diarrhea, abdominal pain, fevers chills sweats. Has associated feeling of "shutting down" and "about to die."   Worsened over the past week, so comes to the ED. In the ED, pt noted to have HTN to 167/107, otherwise vitals stable. Labs showed Hct slightly low at 38.6, K low at 3.4. Trop negative x1. CXR was negative. Pt was given ASA 325, and SL NTG, Dilaudid x2, Morphine x1, Zofran. EKG look normal. Verdis Prime with [facility] was called and recommended medicine admission, and to request cardiology consultation as needed.   ROS as above o/w negative.    Past Medical History  Diagnosis Date  . 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 Alcohol abuse   . Tobacco abuse   . Nephrolithiasis 04/2011    Left ureteral stone  . Chronic pain syndrome     Narcotic seeking behavior with numerous requests for pain medications, IV dilaudid  . Gout   . Hypertension     No past surgical history on file.  Medications:  HOME MEDS: Prior to Admission medications   Medication Sig Start Date End Date Taking? Authorizing Provider  HYDROcodone-acetaminophen (LORCET) 10-650 MG per tablet Take 1 tablet by mouth as needed.     Yes Historical Provider, MD  oxyCODONE-acetaminophen (PERCOCET) 5-325 MG per tablet Take 1 tablet by mouth every 4 (four) hours as needed. For pain   Yes Historical Provider, MD    Allergies:  No Known Allergies  Social History:   reports that he has been smoking.  He does not have any smokeless tobacco history on file. He reports that he drinks alcohol. He reports that he does not use illicit drugs.  Family History: Family History  Problem Relation Age of Onset  . Diabetes Mother     No known heart disease, didn't know his father    Physical Exam: Filed Vitals:   11/04/11 2034 11/04/11 2100 11/04/11 2130 11/04/11 2223  BP:  157/132 167/107 167/101  Pulse: 65 68 64 71  Temp:      TempSrc:      Resp:  14 12 15   SpO2: 100% 99% 100% 100%   Blood pressure 167/101, pulse 71, temperature 98.8 F (37.1 C), temperature source Oral, resp. rate 15, SpO2 100.00%.  Gen: Milddle aged M in no distress, appears well, wife at bedside. Can relate his history. Not obese, but is a large guy HEENT: PERRL, EOMI, sclera clear, normal appearing. No nystagmus. Conjunctivae normal Neck: Supple, normal  Lungs: CTAB no w/c/r, good air movement, benign exam Heart: RRR, no m / g, benign exam as well Abd: Soft, NT ND, benign Extrem: Warm, well perfused, no cyanosis, normal, no BLE edema Neuro: Alert, conversant, pleasant, moves extremities, non-focal  Skin: Lots of various tattoos, large one on his back     Labs & Imaging Results for orders placed during the hospital encounter of 11/04/11 (from the past 48 hour(s))  CBC     Status: Abnormal   Collection Time   11/04/11  5:26 PM      Component Value Range Comment   WBC 5.3  4.0 - 10.5 (K/uL)    RBC 4.16 (*) 4.22 - 5.81 (MIL/uL)    Hemoglobin 13.8  13.0 - 17.0 (g/dL)    HCT 96.0 (*) 45.4 - 52.0 (%)    MCV 92.8  78.0 - 100.0 (fL)    MCH 33.2  26.0 - 34.0 (pg)    MCHC 35.8  30.0 - 36.0 (g/dL)    RDW 09.8  11.9 - 14.7 (%)    Platelets 202  150 - 400 (K/uL)   BASIC METABOLIC PANEL     Status: Abnormal   Collection Time   11/04/11  5:26 PM      Component Value Range Comment   Sodium 138  135 - 145 (mEq/L)    Potassium 3.4 (*) 3.5 - 5.1 (mEq/L)    Chloride 99  96 - 112 (mEq/L)    CO2 27  19 - 32 (mEq/L)    Glucose, Bld 115 (*) 70 - 99 (mg/dL)    BUN 16  6 - 23 (mg/dL)    Creatinine, Ser 8.29  0.50 - 1.35 (mg/dL)    Calcium 9.5  8.4 - 10.5 (mg/dL)    GFR calc non Af Amer 81 (*) >90 (mL/min)    GFR calc Af Amer >90  >90 (mL/min)   POCT I-STAT TROPONIN I     Status: Normal   Collection Time   11/04/11  5:40 PM      Component Value Range Comment   Troponin i, poc 0.00  0.00 - 0.08 (ng/mL)    Comment 3             Dg Chest 2 View  11/04/2011  *RADIOLOGY REPORT*  Clinical Data: Chest pain, shortness of breath  CHEST - 2 VIEW  Comparison: 09/14/2010  Findings: Mild increased interstitial markings, equivocal. No frank interstitial edema.  No pleural effusion or pneumothorax.  Cardiomediastinal silhouette is within normal limits.  Visualized osseous structures are within normal limits.  IMPRESSION: No evidence of acute cardiopulmonary disease.  Original Report Authenticated By: Charline Bills, M.D.    Echo 06/2010   Study Conclusions   Left ventricle: Systolic function was normal. The estimated ejection   fraction was in the range of 50% to 55%.  EKG NSR, 91 bpm, normal axis, normal P waves, no Q waves, narrow QRS 92 msec, good RWP, no ST  segment deviation, normal TW, ? U waves in V5-6. Compared to prior 04/2011, no change     Impression Present on Admission:  .Chest pain .SUPRAVENTRICULAR TACHYCARDIA .Supraventricular tachycardia .Chronic pain syndrome .Hypertension  45yoM with longstanding h/o SVT s/p ablation in 05/2009, wide complext tachycardia and  emergent cardioversion and s/p  2nd ablation 06/2010, chronic chest pain and ? narcotics  seeking behavior presents with chest soreness x1 year, associated with SOB, lethargy and  being "glazed over" and "out of it."   1. Chest pain, poor exercise tolerance/malaise: DDx includes musculoskeletal pain  (states chronic chest pain started after cardioversion last year and he's very TTP of  his chest), cardiac dysrhythmia, chronic pericarditis. Seems like stable vs unstable  angina are less likely. Rhythm issues considered, especially given his history and  associated symptoms of SOB, malaise, lethargy. Also in DDx: panic attacks, narcotics  seeking although these would be considered after more serious etiologies ruled out.   His EKG is unchanged, Trop negative, and telemetry/EKG shows sinus in the 70's  - ROMI with enzymes, trend EKG - Touch base with Verdis Prime in the am for consultation and to figure out what coronary  evaluation he's had. I cannot see any cath reports or stress tests in our chart, so I would  ask him about that first before embarking on extensive work up - ASA 325 - Keep on telemetry - If coronary w/u has been recently proven negative, then consider MSK / pericarditis  etiology and consider long term pain control as outpt with repeat trial of NSAID's, ?  Prednisone - HTN control as below  - Get TSH   2. Severe HTN: Will start with ACEi low dose and Labetalol   3. HypoK: Mild, will give oral repletion.   4. Social: requests SW consult for issues with insurance, access to f/u care   Telemetry, team 4 Full code, discussed   Other plans as per  orders.    Robina Hamor 11/04/2011, 11:32 PM

## 2011-11-05 ENCOUNTER — Encounter (HOSPITAL_COMMUNITY): Payer: Self-pay | Admitting: *Deleted

## 2011-11-05 LAB — BASIC METABOLIC PANEL
BUN: 15 mg/dL (ref 6–23)
CO2: 27 mEq/L (ref 19–32)
Calcium: 8.4 mg/dL (ref 8.4–10.5)
Chloride: 102 mEq/L (ref 96–112)
Creatinine, Ser: 0.98 mg/dL (ref 0.50–1.35)

## 2011-11-05 LAB — CARDIAC PANEL(CRET KIN+CKTOT+MB+TROPI)
CK, MB: 2.2 ng/mL (ref 0.3–4.0)
Relative Index: INVALID (ref 0.0–2.5)
Total CK: 72 U/L (ref 7–232)
Troponin I: <0.3 ng/mL (ref <0.30)
Troponin I: <0.3 ng/mL (ref <0.30)

## 2011-11-05 LAB — CBC
HCT: 39 % (ref 39.0–52.0)
MCH: 32.2 pg (ref 26.0–34.0)
MCHC: 34.6 g/dL (ref 30.0–36.0)
MCV: 93.1 fL (ref 78.0–100.0)
Platelets: 198 10*3/uL (ref 150–400)
RDW: 12 % (ref 11.5–15.5)

## 2011-11-05 MED ORDER — ONDANSETRON HCL 4 MG/2ML IJ SOLN: 4.0000 mg | Freq: Four times a day (QID) | INTRAMUSCULAR | Status: DC | PRN

## 2011-11-05 MED ORDER — ZOLPIDEM TARTRATE 5 MG PO TABS
10.0000 mg | ORAL_TABLET | Freq: Every evening | ORAL | Status: DC | PRN
  Administered 2011-11-05 (×2): 10 mg via ORAL
  Filled 2011-11-05 (×2): qty 2

## 2011-11-05 MED ORDER — ACETAMINOPHEN 325 MG PO TABS
650.0000 mg | ORAL_TABLET | Freq: Four times a day (QID) | ORAL | Status: DC | PRN
Start: 2011-11-05 — End: 2011-11-06
  Administered 2011-11-05 – 2011-11-06 (×2): 650 mg via ORAL
  Filled 2011-11-05 (×2): qty 2

## 2011-11-05 MED ORDER — SENNA 8.6 MG PO TABS
2.0000 | ORAL_TABLET | Freq: Every day | ORAL | Status: DC | PRN
  Filled 2011-11-05: qty 2

## 2011-11-05 MED ORDER — ACETAMINOPHEN 650 MG RE SUPP: 650.0000 mg | Freq: Four times a day (QID) | RECTAL | Status: DC | PRN

## 2011-11-05 MED ORDER — POTASSIUM CHLORIDE CRYS ER 20 MEQ PO TBCR
40.0000 meq | EXTENDED_RELEASE_TABLET | Freq: Two times a day (BID) | ORAL | Status: AC
  Administered 2011-11-05 (×2): 40 meq via ORAL
  Filled 2011-11-05 (×2): qty 2

## 2011-11-05 MED ORDER — ONDANSETRON HCL 4 MG PO TABS: 4.0000 mg | ORAL_TABLET | Freq: Four times a day (QID) | ORAL | Status: DC | PRN

## 2011-11-05 MED ORDER — LISINOPRIL 10 MG PO TABS
10.0000 mg | ORAL_TABLET | Freq: Every day | ORAL | Status: DC
  Administered 2011-11-05 – 2011-11-06 (×2): 10 mg via ORAL
  Filled 2011-11-05 (×2): qty 1

## 2011-11-05 MED ORDER — ASPIRIN EC 325 MG PO TBEC
325.0000 mg | DELAYED_RELEASE_TABLET | Freq: Every day | ORAL | Status: DC
  Administered 2011-11-05 – 2011-11-06 (×2): 325 mg via ORAL
  Filled 2011-11-05 (×2): qty 1

## 2011-11-05 MED ORDER — IBUPROFEN 800 MG PO TABS
800.0000 mg | ORAL_TABLET | Freq: Three times a day (TID) | ORAL | Status: DC
  Administered 2011-11-05 – 2011-11-06 (×4): 800 mg via ORAL
  Filled 2011-11-05 (×6): qty 1

## 2011-11-05 MED ORDER — CYCLOBENZAPRINE HCL 5 MG PO TABS
5.0000 mg | ORAL_TABLET | Freq: Three times a day (TID) | ORAL | Status: DC | PRN
  Administered 2011-11-05 – 2011-11-06 (×2): 5 mg via ORAL
  Filled 2011-11-05 (×2): qty 1

## 2011-11-05 MED ORDER — LABETALOL HCL 100 MG PO TABS
100.0000 mg | ORAL_TABLET | Freq: Two times a day (BID) | ORAL | Status: DC
  Administered 2011-11-05: 100 mg via ORAL
  Filled 2011-11-05 (×3): qty 1

## 2011-11-05 MED ORDER — DOCUSATE SODIUM 100 MG PO CAPS
100.0000 mg | ORAL_CAPSULE | Freq: Two times a day (BID) | ORAL | Status: DC
  Administered 2011-11-05: 100 mg via ORAL
  Filled 2011-11-05 (×4): qty 1

## 2011-11-05 MED ORDER — METOPROLOL TARTRATE 50 MG PO TABS
50.0000 mg | ORAL_TABLET | Freq: Two times a day (BID) | ORAL | Status: DC
  Administered 2011-11-05 – 2011-11-06 (×2): 50 mg via ORAL
  Filled 2011-11-05 (×3): qty 1

## 2011-11-05 MED ORDER — HEPARIN SODIUM (PORCINE) 5000 UNIT/ML IJ SOLN
5000.0000 [IU] | Freq: Three times a day (TID) | INTRAMUSCULAR | Status: DC
  Filled 2011-11-05 (×7): qty 1

## 2011-11-05 MED ORDER — MORPHINE SULFATE 4 MG/ML IJ SOLN
2.0000 mg | INTRAMUSCULAR | Status: DC | PRN
  Administered 2011-11-05 (×3): 4 mg via INTRAVENOUS
  Administered 2011-11-05: 2 mg via INTRAVENOUS
  Administered 2011-11-05 (×2): 4 mg via INTRAVENOUS
  Administered 2011-11-06 (×2): 2 mg via INTRAVENOUS
  Filled 2011-11-05 (×8): qty 1

## 2011-11-05 NOTE — Progress Notes (Signed)
DAILY PROGRESS NOTE                              [facility]AL [facility]  SUBJECTIVE: Patient complaining about left-sided chest wall pain increases with movement.  OBJECTIVE: BP 142/91  Pulse 63  Temp(Src) 97.5 F (36.4 C) (Oral)  Resp 20  Ht 6\' 2"  (1.88 m)  Wt 108.8 kg (239 lb 13.8 oz)  BMI 30.80 kg/m2  SpO2 94%  Intake/Output Summary (Last 24 hours) at 11/05/11 0948 Last data filed at 11/05/11 0900  Gross per 24 hour  Intake    480 ml  Output      0 ml  Net    480 ml                      Weight change:  Physical Exam: General: Alert and awake oriented x3 not in any acute distress. HEENT: anicteric sclera, pupils equal reactive to light and accommodation CVS: S1-S2 heard, no murmur rubs or gallops there is reproducible tenderness over the left pectoralis major muscle Chest: clear to auscultation bilaterally, no wheezing rales or rhonchi Abdomen:  normal bowel sounds, soft, nontender, nondistended, no organomegaly Neuro: Cranial nerves II-XII intact, no focal neurological deficits Extremities: no cyanosis, no clubbing or edema noted bilaterally   Lab Results:  Basename 11/05/11 0640 11/04/11 1726  NA 138 138  K 4.1 3.4*  CL 102 99  CO2 27 27  GLUCOSE 89 115*  BUN 15 16  CREATININE 0.98 1.08  CALCIUM 8.4 9.5  MG -- --  PHOS -- --   No results found for this basename: AST:2,ALT:2,ALKPHOS:2,BILITOT:2,PROT:2,ALBUMIN:2 in the last 72 hours No results found for this basename: LIPASE:2,AMYLASE:2 in the last 72 hours  Basename 11/05/11 0640 11/04/11 1726  WBC 4.7 5.3  NEUTROABS -- --  HGB 13.5 13.8  HCT 39.0 38.6*  MCV 93.1 92.8  PLT 198 202   Studies/Results: Dg Chest 2 View  11/04/2011  *RADIOLOGY REPORT*  Clinical Data: Chest pain, shortness of breath  CHEST - 2 VIEW  Comparison: 09/14/2010  Findings: Mild increased interstitial markings, equivocal. No frank interstitial edema.  No pleural effusion or pneumothorax.  Cardiomediastinal  silhouette is within normal limits.  Visualized osseous structures are within normal limits.  IMPRESSION: No evidence of acute cardiopulmonary disease.  Original Report Authenticated By: Charline Bills, M.D.   Medications: Scheduled Meds:   . aspirin EC      . aspirin EC  325 mg Oral Daily  . docusate sodium  100 mg Oral BID  . heparin  5,000 Units Subcutaneous Q8H  .  HYDROmorphone (DILAUDID) injection  1 mg Intravenous Once  .  HYDROmorphone (DILAUDID) injection  1 mg Intravenous Once  . labetalol  100 mg Oral BID  . lisinopril  10 mg Oral Daily  .  morphine injection  6 mg Intravenous Once  . ondansetron  4 mg Intravenous Once  . potassium chloride  40 mEq Oral BID  . DISCONTD: aspirin  325 mg Oral STAT   Continuous Infusions:  PRN Meds:.acetaminophen, acetaminophen, morphine, ondansetron (ZOFRAN) IV, ondansetron, senna, zolpidem, DISCONTD: nitroGLYCERIN  ASSESSMENT & PLAN: Principal Problem:  *Chest pain Active Problems:  Chronic pain syndrome  Supraventricular tachycardia  Hypertension  1. Chest pain: Patient has atypical chest pain, 12-lead EKG and cardiac enzymes being cycled there is no evidence of ischemia. Patient does have history of SVT and with accessory pathway status  post ablation. He did not mention any shortness of breath or palpitation this time. The chest pain appears very clearly as a musculoskeletal pain with multiple points of reproducible tenderness. Patient will be on NSAIDs muscle relaxants and narcotics for pain relief. Patient was questioned before to have a drug seeking behavior.  2. Supraventricular tachycardia: Patient was on metoprolol before I will restart that he was started on lisinopril and labetalol this admission I will put him back on his metoprolol, patient to followup with his primary cardiologist Dr. Garnette Scheuermann.  3. Hypertension: We'll will start the patient on the metoprolol and continue checking his blood pressure.   LOS: 1 day    Shamir Sedlar A 11/05/2011, 9:48 AM

## 2011-11-06 DIAGNOSIS — R0789 Other chest pain: Secondary | ICD-10-CM | POA: Diagnosis present

## 2011-11-06 LAB — CARDIAC PANEL(CRET KIN+CKTOT+MB+TROPI): Total CK: 63 U/L (ref 7–232)

## 2011-11-06 MED ORDER — OXYCODONE-ACETAMINOPHEN 5-325 MG PO TABS: 1.0000 | ORAL_TABLET | Freq: Four times a day (QID) | ORAL | Status: DC | PRN

## 2011-11-06 MED ORDER — CYCLOBENZAPRINE HCL 5 MG PO TABS: 5.0000 mg | ORAL_TABLET | Freq: Three times a day (TID) | ORAL | Status: AC | PRN

## 2011-11-06 MED ORDER — METOPROLOL TARTRATE 50 MG PO TABS: 50.0000 mg | ORAL_TABLET | Freq: Two times a day (BID) | ORAL | Status: DC

## 2011-11-06 NOTE — Progress Notes (Signed)
Patient inteviewed. He currently is not employed. His son assisting him with medications. Having difficulty paying for PCP out of pocket cost. Agreeable to follow up at [facility]. Provided pt with info on community clinic. Explained clinic is not open on weekends to set up appointment. CM will set up appt on Monday and give him a call back with time.

## 2011-11-06 NOTE — Discharge Summary (Signed)
HOSPITAL DISCHARGE SUMMARY  Bobby Horn  MRN: [id number]  DOB:February 17, 1966  Date of Admission: 11/04/2011 Date of Discharge: 11/06/2011         LOS: 2 days   Attending Physician:Zan Orlick A  Patient's PCP: Set up [facility]  Discharge Diagnoses: Present on Admission:  .Chest pain .SUPRAVENTRICULAR TACHYCARDIA .Supraventricular tachycardia .Chronic pain syndrome .Hypertension .Chest pain, musculoskeletal   Current Discharge Medication List    START taking these medications   Details  cyclobenzaprine (FLEXERIL) 5 MG tablet Take 1 tablet (5 mg total) by mouth 3 (three) times daily as needed for muscle spasms. Qty: 20 tablet, Refills: 0    metoprolol (LOPRESSOR) 50 MG tablet Take 1 tablet (50 mg total) by mouth 2 (two) times daily. Qty: 60 tablet, Refills: 0      CONTINUE these medications which have CHANGED   Details  oxyCODONE-acetaminophen (PERCOCET) 5-325 MG per tablet Take 1 tablet by mouth every 6 (six) hours as needed for pain. For pain Qty: 30 tablet, Refills: 0      STOP taking these medications     HYDROcodone-acetaminophen (LORCET) 10-650 MG per tablet           Brief Admission History: 45 y.o.male with longstanding h/o SVT s/p ablation in 05/2009, wide complext tachycardia and emergent cardioversion and s/p 2nd ablation 06/2010, chronic chest pain and ? narcotics seeking behavior presents with chest soreness x1 year, associated with SOB, lethargy and being "glazed over" and "out of it."  1.5 yr ago had a cardiac ablation for SVT, then developed L sided chest pain for the past year. Describes it as soreness in his left chest radiating to L flank and back, happens at least a couple times per week, and sometimes associated with SOB, worsened exercise tolerance and fatigue. Sensations come on if he's been working awhile or has gone a long distance, and improved after 30-60 mins of rest. However, sensations can come with rest. Wife states he looks "glazed  over, looks out of it" and lethargic. He also states these sensations can be associated with palpitations and fast HR. No n/v but interesting states he can sometimes have diarrhea, abdominal pain, fevers chills sweats. Has associated feeling of "shutting down" and "about to die."  Worsened over the past week, so comes to the ED. In the ED, pt noted to have HTN to 167/107, otherwise vitals stable. Labs showed Hct slightly low at 38.6, K low at 3.4. Trop negative x1. CXR was negative. Pt was given ASA 325, and SL NTG, Dilaudid x2, Morphine x1, Zofran. EKG look normal. Verdis Prime with [facility] was called and recommended medicine admission, and to request cardiology consultation as needed.   Hospital Course: Present on Admission:  .Chest pain .SUPRAVENTRICULAR TACHYCARDIA .Supraventricular tachycardia .Chronic pain syndrome .Hypertension .Chest pain, musculoskeletal:  1. Chest pain: Patient does have a long history of are chest pain. This is likely musculoskeletal chest pain. Patient has very reproducible points of tenderness over his left pectoralis muscle as well as the left paraspinal muscles. At the time of admission acute coronary syndrome ruled out by 3 sets of cardiac enzymes and repeat EKG which does not show any evidence of cardiac ischemia. Patient was put on a muscle relaxant and narcotic pain medication which did help with the pain. There was a concern about patient is a having a chronic pain syndrome or drug-seeking behavior. Patient does come all the time with the same complaints which is very consistent. Patient's needs likely further evaluation was physical therapy muscle  relaxants and he might need evaluation for his left shoulder if needed as outpatient.   2. Supraventricular tachycardia: Patient does have history of supraventricular tachycardia history of ablation done by Dr. Paticia Stack in June of 2010 no recent palpitations. Patient was on the metoprolol 50 mg and that was restarted at  this admission prescription was provided to the patient.  3. Hypertension: Patient was not taking medications at home because he does not have primary care physician. Patient was restarted on his metoprolol 50 mg twice a day. Patient to followup with his primary care physician. Patient set up with [facility].  Day of Discharge BP 142/87  Pulse 61  Temp(Src) 97.6 F (36.4 C) (Oral)  Resp 18  Ht 6\' 2"  (1.88 m)  Wt 108.8 kg (239 lb 13.8 oz)  BMI 30.80 kg/m2  SpO2 98% Physical Exam: GEN: No acute distress, cooperative with exam PSYCH: He is alert and oriented x4; does not appear anxious does not appear depressed; affect is normal  HEENT: Mucous membranes pink and anicteric;  Mouth: without oral thrush or lesions Eyes: PERRLA; EOM intact;  Neck: no cervical lymphadenopathy nor thyromegaly or carotid bruit; no JVD;  CHEST WALL: No tenderness, symmetrical to breathing bilaterally CHEST: Normal respiration, clear to auscultation bilaterally  HEART: Regular rate and rhythm; no murmurs, rubs or gallops, S1 and S2 heard  BACK: No kyphosis or scoliosis; no CVA tenderness  ABDOMEN:  soft non-tender; no masses, no organomegaly, normal abdominal bowel sounds; no pannus; no intertriginous candida.  EXTREMITIES: No bone or joint deformity; no edema; no ulcerations.  PULSES: 2+ and symmetric, neurovascularity is intact SKIN: Normal hydration no rash or ulceration, no flushing or suspicious lesions  CNS: Cranial nerves 2-12 grossly intact no focal neurologic deficit, coordination is intact gait not tested    Results for orders placed during the hospital encounter of 11/04/11 (from the past 24 hour(s))  CARDIAC PANEL(CRET KIN+CKTOT+MB+TROPI)     Status: Normal   Collection Time   11/05/11  3:53 PM      Component Value Range   Total CK 72  7 - 232 (U/L)   CK, MB 2.0  0.3 - 4.0 (ng/mL)   Troponin I <0.30  <0.30 (ng/mL)   Relative Index RELATIVE INDEX IS INVALID  0.0 - 2.5   CARDIAC PANEL(CRET  KIN+CKTOT+MB+TROPI)     Status: Normal   Collection Time   11/06/11 12:47 AM      Component Value Range   Total CK 63  7 - 232 (U/L)   CK, MB 1.7  0.3 - 4.0 (ng/mL)   Troponin I <0.30  <0.30 (ng/mL)   Relative Index RELATIVE INDEX IS INVALID  0.0 - 2.5     Disposition: Home   Follow-up Appts: Discharge Orders    Future Orders Please Complete By Expires   Diet - low sodium heart healthy      Increase activity slowly         Follow-up Information    Follow up with Lesleigh Noe (As needed)    Contact information:   [address] [employer] [place] [address] [phone]       Follow up with [facility]. Make an appointment in 2 weeks.          SignedClydia Llano A 11/06/2011, 10:26 AM

## 2011-11-06 NOTE — Progress Notes (Signed)
Pt discharged home with wife.  Pt given d/c instructions and patient able to verbalize instructions.  IV and tele discontinued.  Pt refused wheelchair assistance out and walked with wife.  D/c assessment unchanged from previous assessment.

## 2011-11-07 NOTE — Progress Notes (Signed)
Contacted patient with information on his [facility] appt. Eligibility appt is scheduled for 12/29/2011 at 230 pm and appt with Dr. Clelia Croft on 01/16/2012 at 230p. Gave info about [facility] clinic.

## 2011-11-07 NOTE — Progress Notes (Signed)
11/07/2011 Timia Casselman SPARKS Case Management Note [phone]       Utilization review completed.  

## 2012-01-05 ENCOUNTER — Other Ambulatory Visit: Payer: Self-pay

## 2012-01-05 ENCOUNTER — Encounter (HOSPITAL_COMMUNITY): Payer: Self-pay

## 2012-01-05 ENCOUNTER — Emergency Department (HOSPITAL_COMMUNITY): Payer: Self-pay

## 2012-01-05 ENCOUNTER — Emergency Department (HOSPITAL_COMMUNITY)
Admission: EM | Admit: 2012-01-05 | Discharge: 2012-01-05 | Disposition: A | Discharge status: Home / Self Care | Payer: Self-pay | Attending: Emergency Medicine | Admitting: Emergency Medicine

## 2012-01-05 DIAGNOSIS — Z8639 Personal history of other endocrine, nutritional and metabolic disease: Secondary | ICD-10-CM | POA: Insufficient documentation

## 2012-01-05 DIAGNOSIS — Z7982 Long term (current) use of aspirin: Secondary | ICD-10-CM | POA: Insufficient documentation

## 2012-01-05 DIAGNOSIS — R0789 Other chest pain: Secondary | ICD-10-CM | POA: Insufficient documentation

## 2012-01-05 DIAGNOSIS — G8929 Other chronic pain: Secondary | ICD-10-CM | POA: Insufficient documentation

## 2012-01-05 DIAGNOSIS — I2510 Atherosclerotic heart disease of native coronary artery without angina pectoris: Secondary | ICD-10-CM | POA: Insufficient documentation

## 2012-01-05 DIAGNOSIS — Z79899 Other long term (current) drug therapy: Secondary | ICD-10-CM | POA: Insufficient documentation

## 2012-01-05 DIAGNOSIS — M129 Arthropathy, unspecified: Secondary | ICD-10-CM | POA: Insufficient documentation

## 2012-01-05 DIAGNOSIS — F411 Generalized anxiety disorder: Secondary | ICD-10-CM | POA: Insufficient documentation

## 2012-01-05 DIAGNOSIS — Z862 Personal history of diseases of the blood and blood-forming organs and certain disorders involving the immune mechanism: Secondary | ICD-10-CM | POA: Insufficient documentation

## 2012-01-05 DIAGNOSIS — I10 Essential (primary) hypertension: Secondary | ICD-10-CM | POA: Insufficient documentation

## 2012-01-05 DIAGNOSIS — K219 Gastro-esophageal reflux disease without esophagitis: Secondary | ICD-10-CM | POA: Insufficient documentation

## 2012-01-05 DIAGNOSIS — F17200 Nicotine dependence, unspecified, uncomplicated: Secondary | ICD-10-CM | POA: Insufficient documentation

## 2012-01-05 LAB — DIFFERENTIAL
Basophils Absolute: 0 10*3/uL (ref 0.0–0.1)
Basophils Relative: 1 % (ref 0–1)
Eosinophils Absolute: 0.1 10*3/uL (ref 0.0–0.7)
Eosinophils Relative: 2 % (ref 0–5)
Lymphocytes Relative: 41 % (ref 12–46)
Monocytes Absolute: 0.5 10*3/uL (ref 0.1–1.0)

## 2012-01-05 LAB — CBC
HCT: 42.8 % (ref 39.0–52.0)
MCH: 32.8 pg (ref 26.0–34.0)
MCHC: 35.3 g/dL (ref 30.0–36.0)
MCV: 93 fL (ref 78.0–100.0)
RDW: 12.8 % (ref 11.5–15.5)

## 2012-01-05 LAB — BASIC METABOLIC PANEL
CO2: 24 mEq/L (ref 19–32)
Calcium: 9.2 mg/dL (ref 8.4–10.5)
Creatinine, Ser: 0.95 mg/dL (ref 0.50–1.35)
GFR calc non Af Amer: >90 mL/min (ref >90)

## 2012-01-05 LAB — TROPONIN I: Troponin I: <0.3 ng/mL (ref <0.30)

## 2012-01-05 MED ORDER — DIAZEPAM 5 MG PO TABS: 5.0000 mg | ORAL_TABLET | Freq: Two times a day (BID) | ORAL | Status: AC

## 2012-01-05 MED ORDER — HYDROMORPHONE HCL PF 1 MG/ML IJ SOLN
1.0000 mg | Freq: Once | INTRAMUSCULAR | Status: AC
  Administered 2012-01-05: 1 mg via INTRAVENOUS
  Filled 2012-01-05: qty 1

## 2012-01-05 MED ORDER — LORAZEPAM 1 MG PO TABS
1.0000 mg | ORAL_TABLET | Freq: Once | ORAL | Status: AC
  Administered 2012-01-05: 1 mg via ORAL
  Filled 2012-01-05: qty 1

## 2012-01-05 MED ORDER — KETOROLAC TROMETHAMINE 30 MG/ML IJ SOLN
30.0000 mg | Freq: Once | INTRAMUSCULAR | Status: AC
  Administered 2012-01-05: 30 mg via INTRAVENOUS
  Filled 2012-01-05: qty 1

## 2012-01-05 MED ORDER — OXYCODONE-ACETAMINOPHEN 7.5-500 MG PO TABS: 1.0000 | ORAL_TABLET | Freq: Four times a day (QID) | ORAL | Status: AC | PRN

## 2012-01-05 MED ORDER — OXYCODONE-ACETAMINOPHEN 5-325 MG PO TABS
2.0000 | ORAL_TABLET | Freq: Once | ORAL | Status: AC
  Administered 2012-01-05: 2 via ORAL
  Filled 2012-01-05: qty 2

## 2012-01-05 NOTE — ED Notes (Signed)
Pt sts left sided sharp cp. Worse with deep inspiration. Denies any radiation of pain. sts has been there since last surgery off and on. What makes it better is to not take deep breaths.  Pt writhing around on the stretcher in pain.

## 2012-01-05 NOTE — ED Notes (Signed)
Resident Joines at bedside

## 2012-01-05 NOTE — ED Provider Notes (Signed)
History     CSN: [id number]  Arrival date & time 01/05/12  1813   First MD Initiated Contact with Patient 01/05/12 1837      Chief Complaint  Patient presents with  . Chest Pain    (Consider location/radiation/quality/duration/timing/severity/associated sxs/prior treatment) HPI Comments: 46yo CM with PMH significant for SVT and non-ischemic cardiomyopathy (EF46%) who presents to the ED due to chest pain. Pain left chest. Worse with palpation and certain positions. Has this type of pain chronically. Worse since about 4 hours ago. Usually takes lorcet but he is out of it at this time. No palpitations. No dyspnea.   Patient is a 46 y.o. male presenting with chest pain. The history is provided by the patient and medical records.  Chest Pain Episode onset: abpit 4 hours ago. Chest pain occurs constantly. The chest pain is unchanged. Associated with: movement of left arm. The severity of the pain is moderate. The quality of the pain is described as tearing. Radiates to: associated with movement of left arm but no radiation of pain. Chest pain is worsened by certain positions. Pertinent negatives for primary symptoms include no fever, no fatigue, no syncope, no shortness of breath, no cough, no wheezing, no palpitations, no abdominal pain, no nausea, no vomiting and no dizziness.  Pertinent negatives for associated symptoms include no claudication, no lower extremity edema, no near-syncope, no numbness, no orthopnea, no paroxysmal nocturnal dyspnea and no weakness. He tried nothing for the symptoms.  His past medical history is significant for arrhythmia.  Pertinent negatives for past medical history include no seizures.     Past Medical History  Diagnosis Date  . 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 Alcohol abuse   . Tobacco abuse   . Nephrolithiasis 04/2011    Left ureteral stone  . Chronic pain syndrome     Narcotic seeking behavior with numerous requests for pain medications, IV dilaudid  . Gout   . Hypertension   . Angina   . Shortness of breath   . Coronary artery disease   . GERD (gastroesophageal reflux disease)   . Headache   . Arthritis   . Anxiety     History reviewed. No pertinent past surgical history.  Family History  Problem Relation Age of Onset  . Diabetes Mother     No known heart disease, didn't know his father    History  Substance Use Topics  . Smoking status: Current Everyday Smoker -- 0.5 packs/day for 20 years    Types: Cigarettes  . Smokeless tobacco: Not on file   Comment: Previously 2 ppd, now 0.25 ppd  . Alcohol Use: No     States he has not drank since the ablation in 2011      Review of Systems  Constitutional: Negative for fever, chills, activity change, appetite change and fatigue.  HENT: Negative for congestion, sore throat, rhinorrhea, neck pain and neck stiffness.   Eyes: Negative for photophobia, redness and visual disturbance.  Respiratory: Negative for cough, shortness of breath and wheezing.   Cardiovascular: Positive for chest pain. Negative for palpitations, orthopnea, claudication, leg swelling, syncope and near-syncope.  Gastrointestinal: Negative for nausea, vomiting, abdominal pain, diarrhea, constipation and blood in stool.  Genitourinary: Negative for dysuria, urgency, hematuria and flank pain.  Musculoskeletal: Negative for back pain.  Skin: Negative for rash  and wound.  Neurological: Negative for dizziness, seizures, facial asymmetry, speech difficulty, weakness, light-headedness, numbness and headaches.  Psychiatric/Behavioral: Negative for confusion.  All other systems reviewed and are negative.    Allergies  Review of patient's allergies indicates no known allergies.  Home Medications   Current  Outpatient Rx  Name Route Sig Dispense Refill  . ASPIRIN EC 81 MG PO TBEC Oral Take 81 mg by mouth daily.    Marland Kitchen HYDROCODONE-ACETAMINOPHEN 10-650 MG PO TABS Oral Take 1 tablet by mouth every 4 (four) hours as needed. For pain    . METOPROLOL TARTRATE 50 MG PO TABS Oral Take 50 mg by mouth 2 (two) times daily.      BP 142/94  Pulse 78  Temp(Src) 97.4 F (36.3 C) (Oral)  Resp 20  SpO2 100%  Physical Exam  Nursing note and vitals reviewed. Constitutional: He is oriented to person, place, and time. He appears well-developed and well-nourished.  Non-toxic appearance. No distress.  HENT:  Head: Normocephalic and atraumatic.  Mouth/Throat: Oropharynx is clear and moist.  Eyes: Conjunctivae and EOM are normal. Pupils are equal, round, and reactive to light. No scleral icterus.  Neck: Normal range of motion. Neck supple. No JVD present.  Cardiovascular: Normal rate, regular rhythm, normal heart sounds and intact distal pulses.   No murmur heard. Pulmonary/Chest: Effort normal and breath sounds normal. No respiratory distress. He has no wheezes. He has no rales.  Abdominal: Soft. Bowel sounds are normal. He exhibits no distension. There is no tenderness. There is no rebound and no guarding.  Musculoskeletal: Normal range of motion.  Neurological: He is alert and oriented to person, place, and time. He has normal strength. No cranial nerve deficit. GCS eye subscore is 4. GCS verbal subscore is 5. GCS motor subscore is 6.  Skin: Skin is warm and dry. No rash noted. He is not diaphoretic.  Psychiatric: He has a normal mood and affect.    ED Course  Procedures (including critical care time)  ED ECG REPORT   Date: 01/05/2012  EKG Time: 8:39 PM  Rate: 7  Rhythm: normal sinus rhythm,  normal EKG, normal sinus rhythm, unchanged from previous tracings, normal sinus rhythm.   Axis: normal  Intervals:none  ST&T Change: No ST segment changes  Narrative Interpretation: No acute ST abnormality.                Labs Reviewed  CBC  DIFFERENTIAL  BASIC METABOLIC PANEL  TROPONIN I   No results found.   1. Musculoskeletal chest pain       MDM  46yo CM with PMH significant for SVT and non-ischemic cardiomyopathy (EF46%) who presents to the ED due to chest pain. Pain left chest. Worse with palpation and certain positions. Has this type of pain chronically. Worse since about 4 hours ago. Usually takes lorcet but he is out of it at this time. No palpitations. No dyspnea. Pt with palpable tendereness left breast with palpable spasm of left pectoralis muscle. Normal heart sounds. Doubt ACS. Not c/w dissection PE or PNA. Getting CXR and EKG. Given hx SVT and cardiomyopathy will r/o ACS with trop now and repeat in 2h as this is very atypical. If not elevated or trending upward plan for d/c and follow up with PCP. Will send this pt to holding to await. Giving dilaudid for MSK pain. At this time not giving ASA or NTG as hx and physical exam not c/w ACS.   Initial trop neg.  Verne Carrow, MD 01/05/12 [phone]

## 2012-01-05 NOTE — ED Notes (Signed)
Pt presents with sudden onset of L sided chest pain x 3 hours ago.  +shortness of breath and diaphoresis.  Pt reports numbness to L arm.  Pt reports similar episode x 6 months ago.

## 2012-01-05 NOTE — ED Provider Notes (Signed)
I saw and evaluated the patient, reviewed the resident's note and I agree with the findings and plan.   Patient seen and examined. He does have discrete chest wall tenderness. He has had recent cardiac evaluation that has been normal. I suspect this is a re\re exacerbation of the somewhat chronic pain has been having.  I agree with the residents interpretation of the ECG  Celene Kras, MD 01/05/12 2029

## 2012-01-05 NOTE — ED Notes (Signed)
States pain meds have not helped with pain. Also complaining of severe headache after receiving tordal. Rates pain as 8/10. At worst 10/10. Describes pain as tight and tense like a belt is tightened around chest.

## 2012-01-05 NOTE — ED Provider Notes (Signed)
Labs reviewed, no elevation of cardiac enzymes.  Likely musculoskeletal chest pain.  Will discharge home with percocet and valium, and follow-up with PCP.  Jimmye Norman, NP 01/05/12 [phone]

## 2012-01-05 NOTE — ED Notes (Signed)
Family at bedside. 

## 2012-01-06 ENCOUNTER — Emergency Department (HOSPITAL_COMMUNITY): Admit: 2012-01-06 | Payer: Self-pay | Source: Home / Self Care

## 2012-01-27 ENCOUNTER — Encounter: Payer: Self-pay | Admitting: Cardiovascular Disease

## 2012-01-31 ENCOUNTER — Ambulatory Visit: Payer: Self-pay | Admitting: Cardiovascular Disease

## 2012-03-13 ENCOUNTER — Ambulatory Visit (INDEPENDENT_AMBULATORY_CARE_PROVIDER_SITE_OTHER): Payer: Self-pay | Admitting: Internal Medicine

## 2012-03-13 ENCOUNTER — Encounter: Payer: Self-pay | Admitting: Internal Medicine

## 2012-03-13 VITALS — BP 120/80 | HR 78 | Ht 73.0 in | Wt 256.8 lb

## 2012-03-13 DIAGNOSIS — R0789 Other chest pain: Secondary | ICD-10-CM

## 2012-03-13 MED ORDER — TRAMADOL HCL 50 MG PO TABS: ORAL_TABLET | ORAL | Status: DC

## 2012-03-13 NOTE — Progress Notes (Signed)
Addended by: Dennis Bast F on: 03/13/2012 04:15 PM   Modules accepted: Orders

## 2012-03-13 NOTE — Assessment & Plan Note (Signed)
His chest pain appears to be musculoskeletal. He also has a component that is exertional. To try to sort this out, I recommended that he undergo an exercise treadmill test. This will also help [place] to see if he has any additional exercise induced arrhythmias. I've given the patient a prescription for tramadol. Hopefully this will help with his symptoms.

## 2012-03-13 NOTE — Patient Instructions (Signed)
Your physician has requested that you have an exercise tolerance test. For further information please visit [email or web address]. Please also follow instruction sheet, as given.   

## 2012-03-13 NOTE — Progress Notes (Signed)
HPI [name] today for followup. She is a 46 year old man with recurrent SVT status post catheter ablation x2. The patient has had no recurrent tachycardia palpitations. His complaint today is a left-sided chest pain. He has pain all the time but with exertion the left side of his chest hurts worse. With palpation and movement of his arm, his chest hurts worse as well. He denies syncope. No shortness of breath or peripheral edema. With exertion, his chest discomfort will worsen and he will have to stop is doing. No Known Allergies   Current Outpatient Prescriptions  Medication Sig Dispense Refill  . aspirin EC 81 MG tablet Take 81 mg by mouth daily.      Marland Kitchen HYDROcodone-acetaminophen (LORCET) 10-650 MG per tablet Take 1 tablet by mouth every 4 (four) hours as needed. For pain      . traMADol (ULTRAM) 50 MG tablet Take 1-2 tablets every 12 hours as needed for pain  30 tablet  1     Past Medical History  Diagnosis Date  . 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 Alcohol abuse   . Tobacco abuse   . Nephrolithiasis 04/2011    Left ureteral stone  . Chronic pain syndrome     Narcotic seeking behavior with numerous requests for pain medications, IV dilaudid  . 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 Hypertension   . Angina   . Shortness of breath   . Coronary artery disease   . GERD (gastroesophageal reflux disease)   . Headache   . Arthritis   . Anxiety     ROS:   All systems reviewed and negative except as noted in the HPI.   Past Surgical History  Procedure Date  . Cardiac catheterization 07/01/2010  . Knee arthroplasty     left     Family History  Problem Relation Age of Onset  . Diabetes Mother     No known heart disease, didn't know his father  . Other Other     grandmother-hx of  palpitations  . Coronary artery disease Mother      History   Social History  . Marital Status: Married    Spouse Name: N/A    Number of Children: N/A  . Years of Education: N/A   Occupational History  . Not on file.   Social History Main Topics  . Smoking status: Current Everyday Smoker -- 0.5 packs/day for 20 years    Types: Cigarettes  . Smokeless tobacco: Not on file   Comment: Previously 2 ppd, now 0.25 ppd  . Alcohol Use: No     States he has not drank since the ablation in 2011  . Drug Use: No  . Sexually Active: Not on file   Other Topics Concern  . Not on file   Social History Narrative   Lives in [place] with his wife. 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 Having problems with insurance, medical access     BP 120/80  Pulse 78  Ht 6\' 1"  (1.854 m)  Wt 116.484 kg (256 lb 12.8 oz)  BMI 33.88 kg/m2  Physical Exam:  Well appearing middle-aged man, NAD HEENT: Unremarkable Neck:  No JVD, no thyromegally Lungs:  Clear with no wheezes, rales, or rhonchi. HEART:  Regular rate rhythm, no murmurs, no rubs, no clicks. Chest wall  is tender on the left. Abd:  soft, positive bowel sounds, no organomegally, no rebound, no guarding Ext:  2 plus pulses, no edema, no cyanosis, no clubbing Skin:  No rashes no nodules Neuro:  CN II through XII intact, motor grossly intact  EKG Normal sinus rhythm  Assess/Plan:

## 2012-03-19 ENCOUNTER — Ambulatory Visit (INDEPENDENT_AMBULATORY_CARE_PROVIDER_SITE_OTHER): Payer: Self-pay | Admitting: Cardiology

## 2012-03-19 ENCOUNTER — Telehealth: Payer: Self-pay | Admitting: Internal Medicine

## 2012-03-19 ENCOUNTER — Encounter: Payer: Self-pay | Admitting: Physician Assistant

## 2012-03-19 DIAGNOSIS — R0789 Other chest pain: Secondary | ICD-10-CM

## 2012-03-19 NOTE — Telephone Encounter (Signed)
Spoke with patient and he is coming in today for his GXT  I have told him if this is okay then he will have to get additional medications for the musculoskeletal pain he is having

## 2012-03-19 NOTE — Telephone Encounter (Signed)
New Problem:    Patient called in because he had a few questions about the cardiac testing that was canceled a few days ago and his medication that he was recently prescribed was not strong enough. Please call back.

## 2012-03-19 NOTE — Procedures (Signed)
Exercise Treadmill Test  Pre-Exercise Testing Evaluation Rhythm: normal sinus  Rate: 75   PR:  .13 QRS:  .11  QT:  .40 QTc: .44     Test  Exercise Tolerance Test Ordering MD: Lewayne Bunting, MD  Interpreting MD:  Marca Ancona, MD  Unique Test No: 1  Treadmill:  1  Indication for ETT: chest pain - rule out ischemia  Contraindication to ETT: No   Stress Modality: exercise - treadmill  Cardiac Imaging Performed: non   Protocol: standard Bruce - maximal  Max BP:  174/67  Max MPHR (bpm):  175 85% MPR (bpm):  148  MPHR obtained (bpm):  153 % MPHR obtained:  88%  Reached 85% MPHR (min:sec):  6:46 Total Exercise Time (min-sec):  6:54  Workload in METS:  8.3  Borg Scale: 19  Reason ETT Terminated:  Dyspnea, chest pain, fatigue.     ST Segment Analysis At Rest: normal ST segments - no evidence of significant ST depression With Exercise: no evidence of significant ST depression  Other Information Arrhythmia:  No Angina during ETT:  Patient had chest pain when the test started that worsened with exercise.  Quality of ETT:  diagnostic  ETT Interpretation:  normal - no evidence of ischemia by ST analysis  Comments: No evidence for ischemia.  Has pleuritic chest pain, could consider echo to rule out pericardial fluid.

## 2012-03-26 ENCOUNTER — Encounter: Payer: Self-pay | Admitting: Physician Assistant

## 2012-04-10 ENCOUNTER — Encounter (HOSPITAL_COMMUNITY): Payer: Self-pay | Admitting: *Deleted

## 2012-04-10 ENCOUNTER — Emergency Department (INDEPENDENT_AMBULATORY_CARE_PROVIDER_SITE_OTHER)
Admission: EM | Admit: 2012-04-10 | Discharge: 2012-04-10 | Disposition: A | Discharge status: Home / Self Care | Payer: Self-pay | Source: Home / Self Care | Attending: Family Medicine | Admitting: Family Medicine

## 2012-04-10 DIAGNOSIS — S93409A Sprain of unspecified ligament of unspecified ankle, initial encounter: Secondary | ICD-10-CM

## 2012-04-10 DIAGNOSIS — M109 Gout, unspecified: Secondary | ICD-10-CM

## 2012-04-10 DIAGNOSIS — S93402A Sprain of unspecified ligament of left ankle, initial encounter: Secondary | ICD-10-CM

## 2012-04-10 MED ORDER — PREDNISONE 20 MG PO TABS: ORAL_TABLET | ORAL | Status: AC

## 2012-04-10 MED ORDER — INDOMETHACIN 50 MG PO CAPS: 50.0000 mg | ORAL_CAPSULE | Freq: Two times a day (BID) | ORAL | Status: AC

## 2012-04-10 NOTE — ED Notes (Signed)
Pt  Reports  Pain in  Swelling  l  Knee  As  Well  As l  Foot  He  denys  Any  specefic  Injury  He  Attributes  It to a  Flair up of  His  gout

## 2012-04-10 NOTE — Discharge Instructions (Signed)
Stopped taking aspirin was you are taking Indocin for gout. Take the prescribed medications as instructed remembers to take Indocin with food as it can upset your stomach especially if you're taking prednisone at the same time. Can also take over the counter Prilosec 20 mg daily while taking Indocin to protect her stomach.  Keep ankle brace in place but to remove at least 3 times a day for rehabilitation exercises as per handout. Is possible your uncle pain and swelling is also related to gout. Followup your primary care provider if persistent pain or swelling after 1 week. [facility]up [facility] to continue with your gout workup and monitoring of your symptoms.  Arthritis - Gout Gout is caused by uric acid crystals forming in the joint. The big toe, foot, ankle, and knee are the joints most often affected.Often uric acid levels in the blood are also elevated. Symptoms develop rapidly, usually over hours. A joint fluid exam may be needed to prove the diagnosis. Acute gout episodes may follow a minor injury, surgery, illness or medication change. Gout occurs more often in men. It tends to be an inherited (passed down from a family member) condition. Your chance of having gout is increased if you take certain medications. These include water pills (diuretics), aspirin, niacin, and cyclosporine. Kidney disease and alcohol consumption may also increase your chances of getting gout. Months or years can pass between gout attacks.  Treatment includes ice, rest and raising the affected limb until the swelling and pain are better.Anti-inflammatory medicine usually brings about dramatic relief of pain, redness and swelling within 2 to 3 days. Other medications can also be effective. Increase your fluid intake. Do not drink alcohol or eat liver, sweetbreads, or sardines. Long-term gout management may require medicine to lower blood uric acid levels, or changing or stopping diuretics.  Please see your caregiver if your condition is not better after 1 to 2 days of treatment.  SEEK IMMEDIATE MEDICAL CARE IF:  You have more serious symptoms such as a fever, skin rash, diarrhea, vomiting, or other joint pains. Document Released: 01/12/2005 Document Revised: 11/24/2011 Document Reviewed: 01/26/2009 [facility] Patient Information 2012 [place], [place].

## 2012-04-10 NOTE — ED Notes (Signed)
Pt    Seen  3  Weeks  Ago  For  Gout at  [facility]  Was  Supposed  To have  X  Rays   At that time  But  Did  Not     He  Was  rx  Allopurinol and   Ultram    He reports  Pain on  Weight bearing

## 2012-04-12 ENCOUNTER — Ambulatory Visit (HOSPITAL_COMMUNITY)
Admission: RE | Admit: 2012-04-12 | Discharge: 2012-04-12 | Disposition: A | Discharge status: Home / Self Care | Payer: Self-pay | Source: Ambulatory Visit | Attending: Family Medicine | Admitting: Family Medicine

## 2012-04-12 ENCOUNTER — Other Ambulatory Visit (HOSPITAL_COMMUNITY): Payer: Self-pay | Admitting: Family Medicine

## 2012-04-12 DIAGNOSIS — R52 Pain, unspecified: Secondary | ICD-10-CM

## 2012-04-12 DIAGNOSIS — Z189 Retained foreign body fragments, unspecified material: Secondary | ICD-10-CM | POA: Insufficient documentation

## 2012-04-12 DIAGNOSIS — M25549 Pain in joints of unspecified hand: Secondary | ICD-10-CM | POA: Insufficient documentation

## 2012-04-12 DIAGNOSIS — M795 Residual foreign body in soft tissue: Secondary | ICD-10-CM | POA: Insufficient documentation

## 2012-04-13 NOTE — ED Provider Notes (Signed)
History     CSN: [id number]  Arrival date & time 04/10/12  1658   First MD Initiated Contact with Patient 04/10/12 1734      Chief Complaint  Patient presents with  . Foot Pain    (Consider location/radiation/quality/duration/timing/severity/associated sxs/prior treatment) HPI Comments: 46 y/o male with h/o gout. Here c/o left knee pain and left ankle pain. States his left knee is usually affected by gout but he is unsure if he might have twisted his left ankle as is also swollen and painful. Patient was seen 3 weeks ago by his pcp at [facility], reports his uric acid has been elevated. Has been on allopurinol an tramadol for treatment of his gout.Marland Kitchen Has an xray order for his knee but has not followed up with the referral. States needs something stronger than tramadol for pain. Denied recent narcotic prescription.   Past Medical History  Diagnosis Date  . 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 Alcohol abuse   . Tobacco abuse   . Nephrolithiasis 04/2011    Left ureteral stone  . Chronic pain syndrome     Narcotic seeking behavior with numerous requests for pain medications, IV dilaudid  . 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 Hypertension   . Angina   . Shortness of breath   . Coronary artery disease   . GERD (gastroesophageal reflux disease)   . Headache   . Arthritis   . Anxiety     Past Surgical History  Procedure Date  . Cardiac catheterization 07/01/2010  . Knee arthroplasty     left    Family History  Problem Relation Age of Onset  . Diabetes Mother     No known heart disease, didn't know his father  . Other Other     grandmother-hx of palpitations  . Coronary artery disease Mother     History  Substance Use Topics  . Smoking status: Current Everyday Smoker -- 0.5  packs/day for 20 years    Types: Cigarettes  . Smokeless tobacco: Not on file   Comment: Previously 2 ppd, now 0.25 ppd  . Alcohol Use: No     States he has not drank since the ablation in 2011      Review of Systems  Constitutional: Negative for fever and chills.  Respiratory: Negative for shortness of breath.   Cardiovascular: Negative for chest pain.  Gastrointestinal: Negative for abdominal pain.  Genitourinary: Negative for hematuria and flank pain.  Musculoskeletal: Positive for joint swelling and arthralgias.  Neurological: Negative for dizziness and headaches.    Allergies  Review of patient's allergies indicates no known allergies.  Home Medications   Current Outpatient Rx  Name Route Sig Dispense Refill  . ASPIRIN EC 81 MG PO TBEC Oral Take 81 mg by mouth daily.    Marland Kitchen HYDROCODONE-ACETAMINOPHEN 10-650 MG PO TABS Oral Take 1 tablet by mouth every 4 (four) hours as needed. For pain    . INDOMETHACIN 50 MG PO CAPS Oral Take 1 capsule (50 mg total) by mouth 2 (two) times daily with a meal. 20 capsule 0  . PREDNISONE 20 MG PO TABS  2 tabs po daily for 5 days 10 tablet no  . TRAMADOL HCL 50 MG PO TABS  Take 1-2 tablets every 12 hours as needed for pain 30 tablet 1  BP 151/96  Pulse 83  Temp(Src) 98.6 F (37 C) (Oral)  Resp 16  SpO2 97%  Physical Exam  Nursing note and vitals reviewed. Constitutional: He is oriented to person, place, and time. He appears well-developed and well-nourished. No distress.  Eyes: No scleral icterus.  Neck: No JVD present.  Cardiovascular: Normal heart sounds.  Exam reveals no gallop and no friction rub.   No murmur heard. Pulmonary/Chest: Effort normal and breath sounds normal.  Musculoskeletal:       Left knee diffused tenderness. Mild to moderate effusion. No thickening of kin or skin erythema, also no abrasions, pustules or cuts. No increased temp compared with right.  Fair flexion and extension. No crepitus. No laxity.  Left  ankle: Mild non pitting tenderness diffused swelling in dorsal Tarsum. No peri malleolar pain or bruising. No laxity. Able to bear weight and walk with a limp.   Neurological: He is alert and oriented to person, place, and time.  Skin: No rash noted.    ED Course  Procedures (including critical care time)     1. Gout flare   2. Left ankle sprain       MDM  Impress gout related arthritis. Treated with prednisone and indocin. Patient denied recent narcotic prescriptions as per [facility] registry records patient has filled over 100 percocet pills in April only. I abstained from narcotic prescription on this visit.        Sharin Grave, MD 04/13/12 2009

## 2013-01-07 ENCOUNTER — Emergency Department (HOSPITAL_COMMUNITY): Admission: EM | Admit: 2013-01-07 | Discharge: 2013-01-07 | Discharge status: Against Medical Advice | Payer: Self-pay

## 2013-01-07 NOTE — ED Notes (Signed)
Pt left immediately after signing in at registration window

## 2013-02-05 IMAGING — CT CT ABD-PELV W/O CM
2 of 4 series · 16 of 46 positions shown, 18 images · non-contrast
Comparison: None.

CLINICAL DATA: Sudden onset of severe lower abdominal pain; nausea.

CT ABDOMEN AND PELVIS WITHOUT CONTRAST
TECHNIQUE: Multidetector CT imaging of the abdomen and pelvis was
performed following the standard protocol without intravenous
contrast.

[Series 2: abd/pelv w/o 5.0 b31f st · axial · non-contrast · 0.79mm/px · z∈[-648,-183]mm · 13 of 103 slices shown, 15 images]
[im 5/103  soft-tissue]
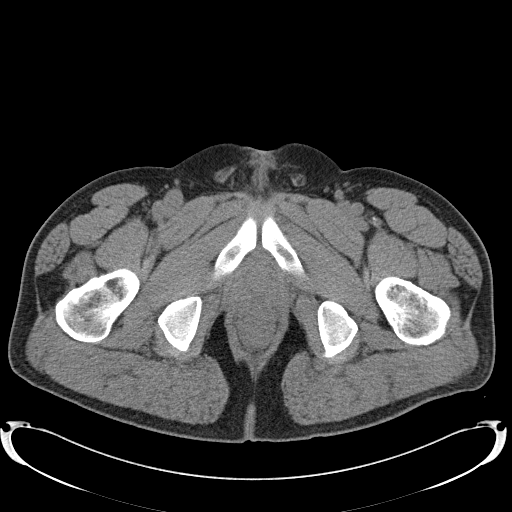
[im 5/103  bone]
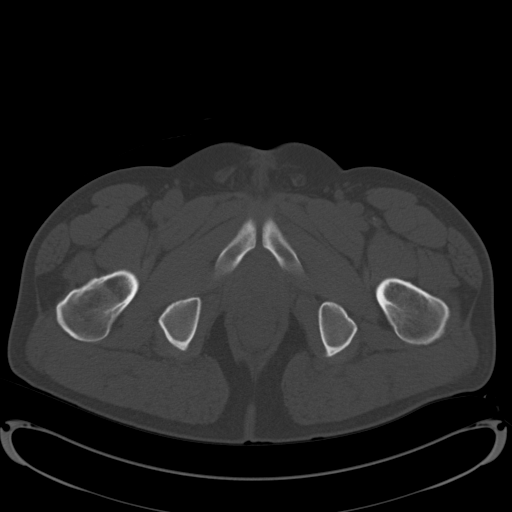
[im 13/103  soft-tissue]
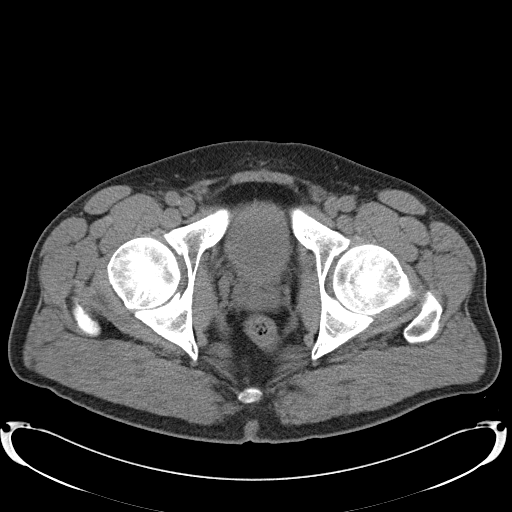
[im 22/103  soft-tissue]
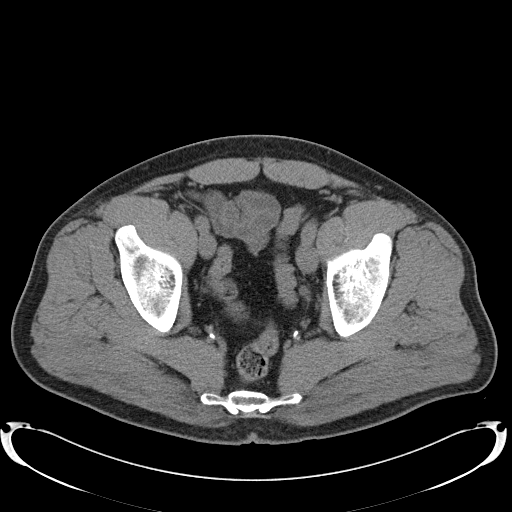
[im 30/103  soft-tissue]
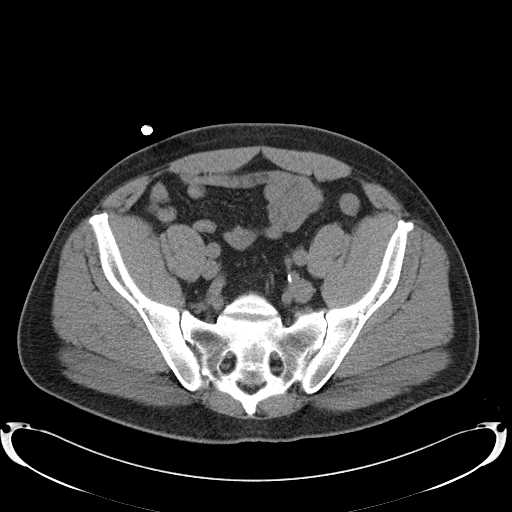
[im 35/103  soft-tissue]
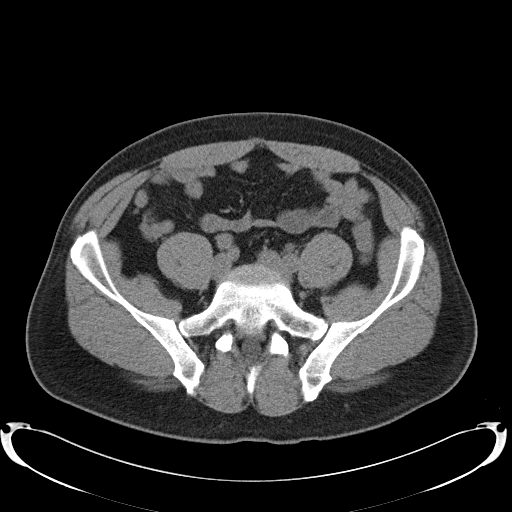
[im 43/103  soft-tissue]
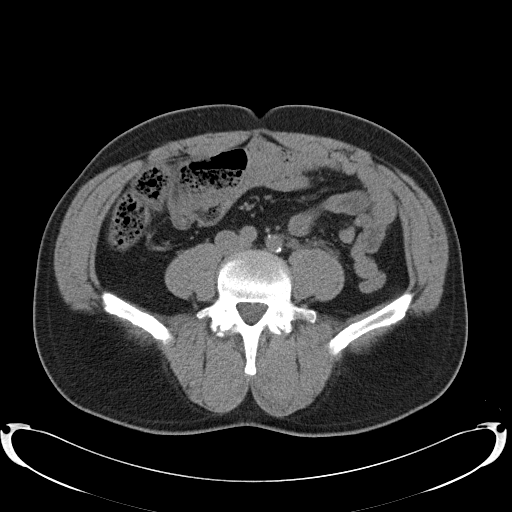
[im 52/103  soft-tissue]
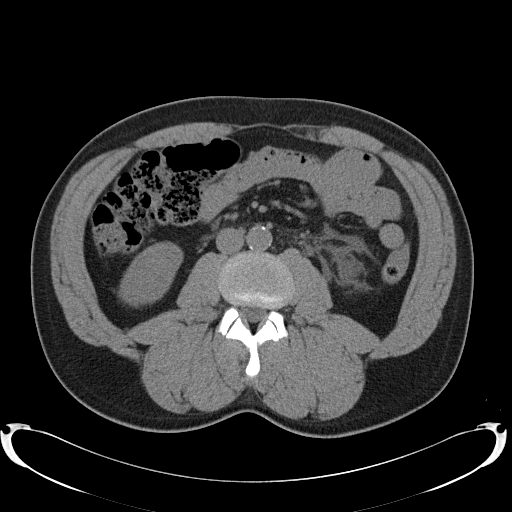
[im 60/103  soft-tissue]
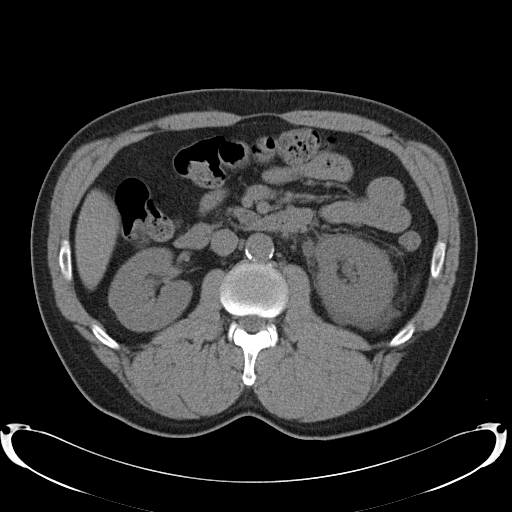
[im 69/103  soft-tissue]
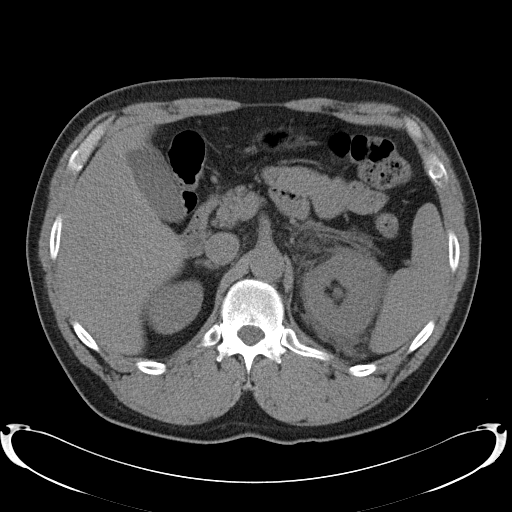
[im 69/103  bone]
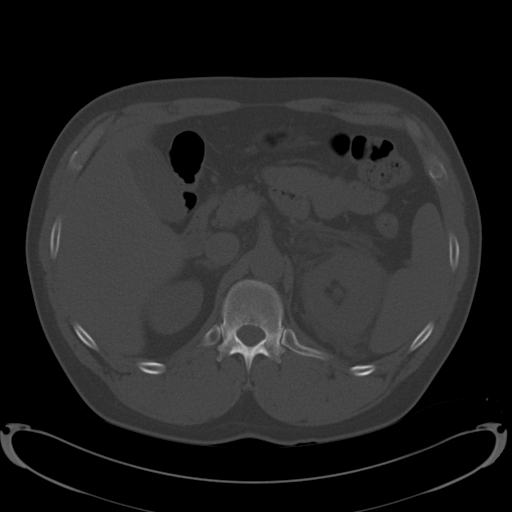
[im 73/103  soft-tissue]
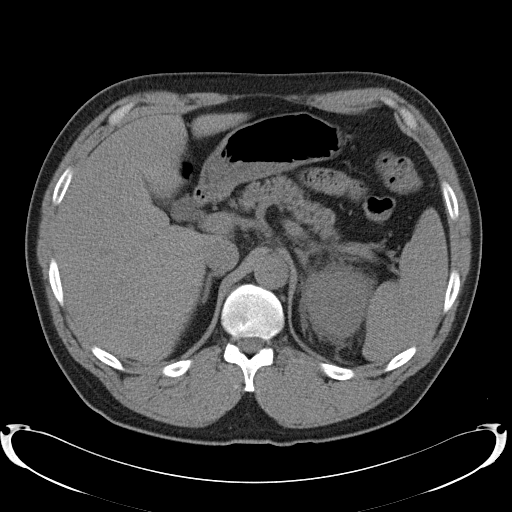
[im 81/103  soft-tissue]
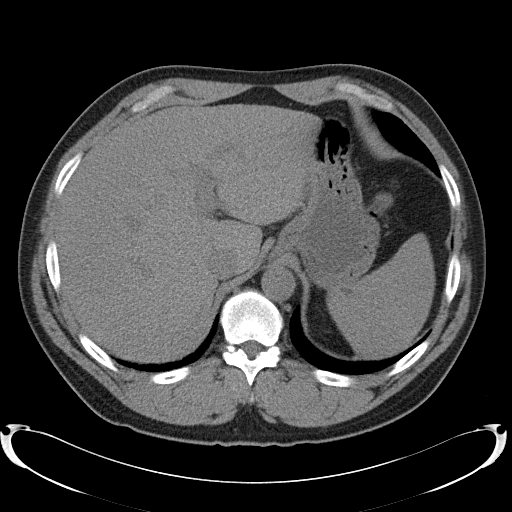
[im 90/103  soft-tissue]
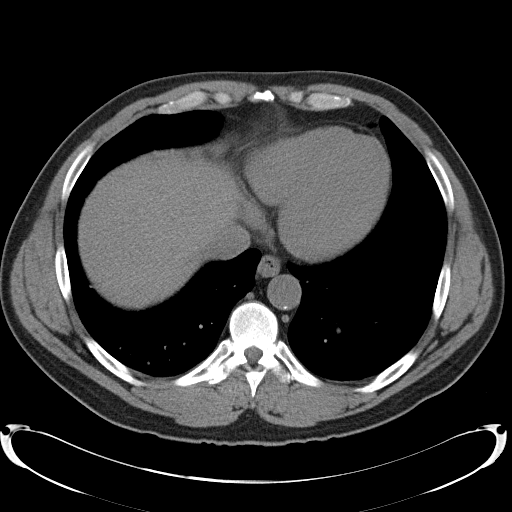
[im 98/103  soft-tissue]
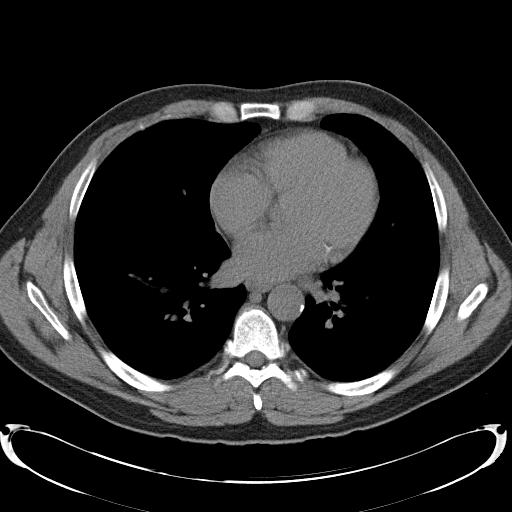

[Series 602: cor · coronal · 1.00mm/px · 3 of 133 slices shown]
[im 45/133  soft-tissue]
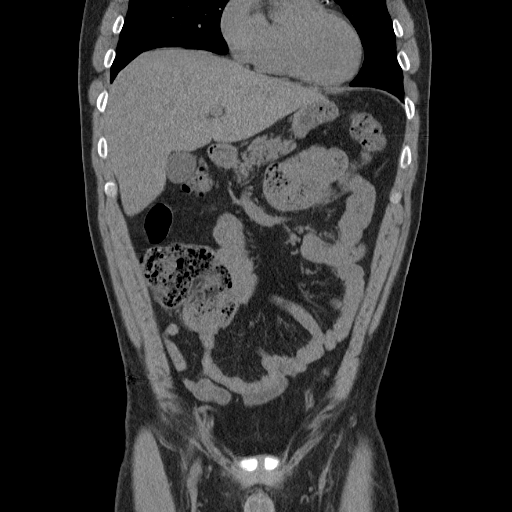
[im 59/133  soft-tissue]
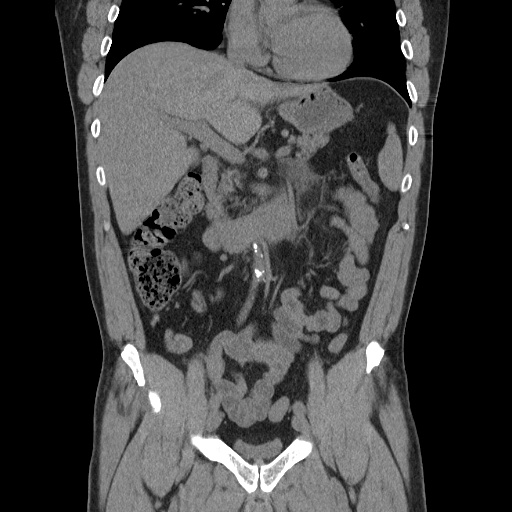
[im 74/133  soft-tissue]
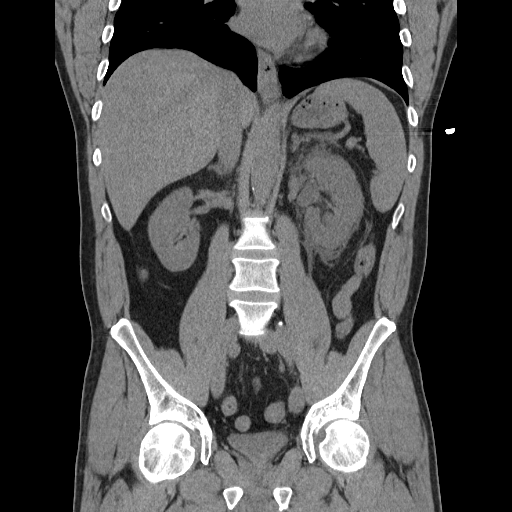

[16 of 46 positions shown; findings below may reference images not displayed]

FINDINGS: Minimal bibasilar atelectasis is noted; a nodular pleural-
based density at the right lung base is thought to reflect
atelectasis.  Mild coronary artery calcifications are seen.

The liver and spleen are unremarkable in appearance.  The
gallbladder is within normal limits.  The pancreas and adrenal
glands are unremarkable.

There is mild left-sided hydronephrosis, with significant left-
sided perinephric stranding and fluid, and prominence of the left
ureter to the level of an obstructing 5 x 4 mm stone in the distal
left ureter, at the left vesicoureteral junction.  No
nonobstructing renal stones are identified.  The right kidney is
unremarkable in appearance.

No free fluid is identified.  The small bowel is unremarkable in
appearance.  The stomach is within normal limits.  No acute
vascular abnormalities are seen.  Scattered calcification is noted
along the abdominal aorta and its branches, advanced for age.

The appendix is normal in caliber, without evidence for
appendicitis.  Stool is seen partially filling the colon, though
the descending and sigmoid colon are decompressed.  The colon is
unremarkable in appearance.

The bladder is decompressed and not well assessed.  The prostate
remains normal in size.  No inguinal lymphadenopathy is seen.

No acute osseous abnormalities are identified.
IMPRESSION: 1.  Mild left-sided hydronephrosis, with significant left-sided
perinephric stranding and fluid, and an obstructing 5 x 4 mm stone
in the distal left ureter, at the left vesicoureteral junction.
2.  Scattered calcification along the abdominal aorta and its
branches, advanced for age.
3.  Mild coronary artery calcifications seen.

## 2013-06-22 ENCOUNTER — Emergency Department (HOSPITAL_COMMUNITY)
Admission: EM | Admit: 2013-06-22 | Discharge: 2013-06-22 | Disposition: A | Discharge status: Home / Self Care | Payer: Self-pay | Attending: Emergency Medicine | Admitting: Emergency Medicine

## 2013-06-22 ENCOUNTER — Encounter (HOSPITAL_COMMUNITY): Payer: Self-pay | Admitting: *Deleted

## 2013-06-22 ENCOUNTER — Emergency Department (HOSPITAL_COMMUNITY): Payer: Self-pay

## 2013-06-22 DIAGNOSIS — Z9861 Coronary angioplasty status: Secondary | ICD-10-CM | POA: Insufficient documentation

## 2013-06-22 DIAGNOSIS — Z8659 Personal history of other mental and behavioral disorders: Secondary | ICD-10-CM | POA: Insufficient documentation

## 2013-06-22 DIAGNOSIS — Z8679 Personal history of other diseases of the circulatory system: Secondary | ICD-10-CM | POA: Insufficient documentation

## 2013-06-22 DIAGNOSIS — Y939 Activity, unspecified: Secondary | ICD-10-CM | POA: Insufficient documentation

## 2013-06-22 DIAGNOSIS — Z8739 Personal history of other diseases of the musculoskeletal system and connective tissue: Secondary | ICD-10-CM | POA: Insufficient documentation

## 2013-06-22 DIAGNOSIS — Z87442 Personal history of urinary calculi: Secondary | ICD-10-CM | POA: Insufficient documentation

## 2013-06-22 DIAGNOSIS — G894 Chronic pain syndrome: Secondary | ICD-10-CM | POA: Insufficient documentation

## 2013-06-22 DIAGNOSIS — Y929 Unspecified place or not applicable: Secondary | ICD-10-CM | POA: Insufficient documentation

## 2013-06-22 DIAGNOSIS — W19XXXA Unspecified fall, initial encounter: Secondary | ICD-10-CM | POA: Insufficient documentation

## 2013-06-22 DIAGNOSIS — M7021 Olecranon bursitis, right elbow: Secondary | ICD-10-CM

## 2013-06-22 DIAGNOSIS — Z862 Personal history of diseases of the blood and blood-forming organs and certain disorders involving the immune mechanism: Secondary | ICD-10-CM | POA: Insufficient documentation

## 2013-06-22 DIAGNOSIS — S52043A Displaced fracture of coronoid process of unspecified ulna, initial encounter for closed fracture: Secondary | ICD-10-CM | POA: Insufficient documentation

## 2013-06-22 DIAGNOSIS — I10 Essential (primary) hypertension: Secondary | ICD-10-CM | POA: Insufficient documentation

## 2013-06-22 DIAGNOSIS — F17200 Nicotine dependence, unspecified, uncomplicated: Secondary | ICD-10-CM | POA: Insufficient documentation

## 2013-06-22 DIAGNOSIS — Z8639 Personal history of other endocrine, nutritional and metabolic disease: Secondary | ICD-10-CM | POA: Insufficient documentation

## 2013-06-22 DIAGNOSIS — I2510 Atherosclerotic heart disease of native coronary artery without angina pectoris: Secondary | ICD-10-CM | POA: Insufficient documentation

## 2013-06-22 DIAGNOSIS — M7020 Olecranon bursitis, unspecified elbow: Secondary | ICD-10-CM | POA: Insufficient documentation

## 2013-06-22 DIAGNOSIS — S52041A Displaced fracture of coronoid process of right ulna, initial encounter for closed fracture: Secondary | ICD-10-CM

## 2013-06-22 MED ORDER — SULFAMETHOXAZOLE-TRIMETHOPRIM 800-160 MG PO TABS: 1.0000 | ORAL_TABLET | Freq: Two times a day (BID) | ORAL | Status: AC

## 2013-06-22 MED ORDER — HYDROMORPHONE HCL PF 1 MG/ML IJ SOLN: 1.0000 mg | Freq: Once | INTRAMUSCULAR | Status: DC

## 2013-06-22 NOTE — ED Notes (Signed)
C/o R elbow pain, swelling & redness, reports injury, "think I fell on it yesterday", hurts constantly, worse with movement, took 10/325 vicodin w/o relief.

## 2013-06-22 NOTE — ED Notes (Signed)
Pt's states that "Alona Bene" is waiting out front in a "blue truck" to pick him up.  Asked for him to tell her to pull up to verify.  Security and nurse first unable to locate blue truck.  Pt notified of same and states he will leave without medication.  States he has pain meds at home.

## 2013-06-22 NOTE — ED Provider Notes (Signed)
History    CSN: [id number] Arrival date & time 06/22/13  0100  First MD Initiated Contact with Patient 06/22/13 0103     Chief Complaint  Patient presents with  . Extremity Pain   (Consider location/radiation/quality/duration/timing/severity/associated sxs/prior Treatment) HPI Comments: Patient is a 47 year old male who presents for right elbow pain x2 days. Patient states that pain is sharp and stabbing and not it feels like someone is poking him an ice pick. Patient denies radiation of the pain and states that it is aggravated with elbow flexion and extension. Patient admits to associated swelling and redness. He has been trying 10/325 vicodin without relief as well as topical ice packs.patient denies fevers, pallor of RUE, numbness or tingling, and extremity weakness.  Patient is a 47 y.o. male presenting with extremity pain. The history is provided by the patient. No language interpreter was used.  Extremity Pain Associated symptoms include arthralgias and joint swelling. Pertinent negatives include no fever, numbness, rash or weakness.   Past Medical History  Diagnosis Date  . 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 Alcohol abuse   . Tobacco abuse   . Nephrolithiasis 04/2011    Left ureteral stone  . Chronic pain syndrome     Narcotic seeking behavior with numerous requests for pain medications, IV dilaudid  . 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 Hypertension   . Angina   . Shortness of breath   . Coronary artery disease   . GERD (gastroesophageal reflux disease)   . Headache(784.0)   . Arthritis   . Anxiety    Past Surgical History  Procedure Laterality Date  . Cardiac catheterization  07/01/2010  . Knee arthroplasty      left   Family History  Problem Relation Age of Onset  .  Diabetes Mother     No known heart disease, didn't know his father  . Other Other     grandmother-hx of palpitations  . Coronary artery disease Mother    History  Substance Use Topics  . Smoking status: Current Every Day Smoker -- 0.50 packs/day for 20 years    Types: Cigarettes  . Smokeless tobacco: Not on file     Comment: Previously 2 ppd, now 0.25 ppd  . Alcohol Use: No     Comment: States he has not drank since the ablation in 2011    Review of Systems  Constitutional: Negative for fever.  Musculoskeletal: Positive for joint swelling and arthralgias.  Skin: Negative for pallor and rash.  Neurological: Negative for weakness and numbness.  All other systems reviewed and are negative.    Allergies  Review of patient's allergies indicates no known allergies.  Home Medications   Current Outpatient Rx  Name  Route  Sig  Dispense  Refill  . HYDROcodone-acetaminophen (NORCO) 10-325 MG per tablet   Oral   Take 1 tablet by mouth every 6 (six) hours as needed for pain.          BP 150/86  Pulse 92  Temp(Src) 97.6 F (36.4 C) (Oral)  Resp 16  Ht 6\' 1"  (1.854 m)  Wt 245 lb (111.131 kg)  BMI 32.33 kg/m2  SpO2 100%  Physical Exam  Nursing note and vitals reviewed. Constitutional: He is oriented to person, place, and time. He appears well-developed and well-nourished. No distress.  HENT:  Head: Normocephalic and atraumatic.  Mouth/Throat: Oropharynx is clear and moist. No oropharyngeal exudate.  Eyes: Conjunctivae and EOM are normal. No scleral icterus.  Neck: Normal range of motion.  Cardiovascular: Normal rate, regular rhythm and intact distal pulses.   Distal radial pulses 2+ bilaterally. Capillary refill normal.  Pulmonary/Chest: Effort normal. No respiratory distress.  Musculoskeletal:       Right elbow: He exhibits decreased range of motion (Secondary to pain) and swelling (swelling olecranon process; area mildly fluctuant). Tenderness found. Olecranon process  tenderness noted.       Left upper arm: Normal.       Left forearm: Normal.  Neurological: He is alert and oriented to person, place, and time.  No sensory or motor deficits appreciated  Skin: Skin is warm and dry. No rash noted. He is not diaphoretic. There is erythema (R olecranon process). No pallor.  Psychiatric: He has a normal mood and affect. His behavior is normal.    ED Course  Procedures (including critical care time) Labs Reviewed - No data to display Dg Elbow Complete Right  06/22/2013   *RADIOLOGY REPORT*  Clinical Data: Pain and swelling in the right elbow after fall.  RIGHT ELBOW - COMPLETE 3+ VIEW  Comparison: None.  Findings: There is a mildly displaced fracture of the coronoid process of the right ulna.  Displaced fracture fragment results in a loose body.  Minimal right elbow effusion.  There is a spur of the posterior olecranon process with overlying soft tissue swelling suggesting bursitis or edema.  No focal bone destruction or expansile change.  IMPRESSION: Mildly displaced fracture of the coronoid process of the right ulna. Soft tissue swelling versus bursitis over the olecranon   Original Report Authenticated By: Burman Nieves, M.D.   INCISION AND DRAINAGE Performed by: Antony Madura Consent: Verbal consent obtained. Risks and benefits: risks, benefits and alternatives were discussed Type: abscess  Body area: R olecranon process  Anesthesia: local infiltration  Incision was made with an 18-gauge needle in attempt to aspirate fluid  Local anesthetic: lidocaine 2% with epinephrine  Anesthetic total: 3 ml  Complexity: simple  Drainage: none  Drainage amount: none  Packing material: none  Patient tolerance: Patient tolerated the procedure well with no immediate complications.   1. Olecranon bursitis, right   2. Fracture of coronoid process of right ulna     MDM  Uncomplicated olecranon bursitis - Patient neurovascularly intact and afebrile. Physical  exam as above with swelling over the olecranon process and erythema without active drainage. Mild area of fluctuance appreciated. Fluid aspiration attempted for culture without success. Xray with mild and extremely small fracture of coronoid process on my interpretation. Given size of fracture fragment, see no benefit in splinting or immobilization. Patient stable and appropriate for discharge with orthopedic follow up for further evaluation of symptoms. Patient put on Bactrim to cover for infection. Patient already with Norco 10/325 at home for pain control. Indications for ED return discussed with the patient verbalizes comfort and understanding with this discharge plan.  Antony Madura, [place] 06/23/13 [phone]

## 2013-06-23 NOTE — ED Provider Notes (Signed)
Medical screening examination/treatment/procedure(s) were conducted as a shared visit with non-physician practitioner(s) and myself.  I personally evaluated the patient during the encounter  Patient evaluated for pain and swelling of the right elbow. Patient has tenderness, swelling and erythema over the olecranon bursa area consistent with bursitis. Recommended needle aspiration to obtain a culture, no fluid was obtained. Patient initiated on empiric antibiotic coverage and followup with orthopedics.  Gilda Crease, MD 06/23/13 2300

## 2014-08-11 ENCOUNTER — Inpatient Hospital Stay: Payer: Self-pay | Admitting: Infectious Disease

## 2015-03-02 ENCOUNTER — Encounter (HOSPITAL_COMMUNITY): Payer: Self-pay

## 2015-03-02 ENCOUNTER — Emergency Department (HOSPITAL_COMMUNITY)
Admission: EM | Admit: 2015-03-02 | Discharge: 2015-03-02 | Discharge status: Against Medical Advice | Payer: Self-pay | Attending: Emergency Medicine | Admitting: Emergency Medicine

## 2015-03-02 DIAGNOSIS — I25119 Atherosclerotic heart disease of native coronary artery with unspecified angina pectoris: Secondary | ICD-10-CM | POA: Insufficient documentation

## 2015-03-02 DIAGNOSIS — Z720 Tobacco use: Secondary | ICD-10-CM | POA: Insufficient documentation

## 2015-03-02 DIAGNOSIS — Z87442 Personal history of urinary calculi: Secondary | ICD-10-CM | POA: Insufficient documentation

## 2015-03-02 DIAGNOSIS — G8929 Other chronic pain: Secondary | ICD-10-CM | POA: Insufficient documentation

## 2015-03-02 DIAGNOSIS — Z8659 Personal history of other mental and behavioral disorders: Secondary | ICD-10-CM | POA: Insufficient documentation

## 2015-03-02 DIAGNOSIS — I10 Essential (primary) hypertension: Secondary | ICD-10-CM | POA: Insufficient documentation

## 2015-03-02 DIAGNOSIS — T50904A Poisoning by unspecified drugs, medicaments and biological substances, undetermined, initial encounter: Secondary | ICD-10-CM

## 2015-03-02 DIAGNOSIS — Z8719 Personal history of other diseases of the digestive system: Secondary | ICD-10-CM | POA: Insufficient documentation

## 2015-03-02 DIAGNOSIS — Z79899 Other long term (current) drug therapy: Secondary | ICD-10-CM | POA: Insufficient documentation

## 2015-03-02 DIAGNOSIS — M1990 Unspecified osteoarthritis, unspecified site: Secondary | ICD-10-CM | POA: Insufficient documentation

## 2015-03-02 DIAGNOSIS — T424X4A Poisoning by benzodiazepines, undetermined, initial encounter: Secondary | ICD-10-CM | POA: Insufficient documentation

## 2015-03-02 DIAGNOSIS — Z9889 Other specified postprocedural states: Secondary | ICD-10-CM | POA: Insufficient documentation

## 2015-03-02 LAB — COMPREHENSIVE METABOLIC PANEL
ALBUMIN: 4.5 g/dL (ref 3.5–5.2)
ALT: 17 U/L (ref 0–53)
ANION GAP: 7 (ref 5–15)
AST: 16 U/L (ref 0–37)
Alkaline Phosphatase: 72 U/L (ref 39–117)
BILIRUBIN TOTAL: 0.5 mg/dL (ref 0.3–1.2)
BUN: 15 mg/dL (ref 6–23)
CALCIUM: 8.9 mg/dL (ref 8.4–10.5)
CO2: 25 mmol/L (ref 19–32)
Chloride: 104 mmol/L (ref 96–112)
Creatinine, Ser: 0.9 mg/dL (ref 0.50–1.35)
GFR calc non Af Amer: >90 mL/min (ref >90)
Glucose, Bld: 111 mg/dL — ABNORMAL HIGH (ref 70–99)
Potassium: 3.7 mmol/L (ref 3.5–5.1)
SODIUM: 136 mmol/L (ref 135–145)
TOTAL PROTEIN: 7.7 g/dL (ref 6.0–8.3)

## 2015-03-02 LAB — CBC
HEMATOCRIT: 42.9 % (ref 39.0–52.0)
Hemoglobin: 15.2 g/dL (ref 13.0–17.0)
MCH: 32.4 pg (ref 26.0–34.0)
MCHC: 35.4 g/dL (ref 30.0–36.0)
MCV: 91.5 fL (ref 78.0–100.0)
Platelets: 269 10*3/uL (ref 150–400)
RBC: 4.69 MIL/uL (ref 4.22–5.81)
RDW: 13.2 % (ref 11.5–15.5)
WBC: 11.4 10*3/uL — AB (ref 4.0–10.5)

## 2015-03-02 LAB — ACETAMINOPHEN LEVEL

## 2015-03-02 LAB — SALICYLATE LEVEL: SALICYLATE LVL: 5.8 mg/dL (ref 2.8–20.0)

## 2015-03-02 MED ORDER — IBUPROFEN 200 MG PO TABS
600.0000 mg | ORAL_TABLET | Freq: Three times a day (TID) | ORAL | Status: DC | PRN
  Administered 2015-03-02: 600 mg via ORAL
  Filled 2015-03-02: qty 3

## 2015-03-02 NOTE — ED Notes (Signed)
Per [facility]- Per [facility] pt found unresponsive by wife. Wife observed pt "taking handful of pills ". He admits to (totem poles of xanax 2.5). Pt given NARCAN 0.5 NASAL. Responded. EMS pt alert and active with care [facility] 15 PEAEL -3. Pt does not admit to SI

## 2015-03-02 NOTE — ED Notes (Signed)
Attempted to call patient to request return to ER for IV removal, patient did not answer the phone. [employer] requested to address to check IV status.

## 2015-03-02 NOTE — ED Provider Notes (Signed)
CSN: [id number]     Arrival date & time 03/02/15  1643 History   First MD Initiated Contact with Patient 03/02/15 1649     Chief Complaint  Patient presents with  . Drug Overdose     (Consider location/radiation/quality/duration/timing/severity/associated sxs/prior Treatment) HPI Comments: The patient is a 49 year old male, he has a history of chronic pain and also a history of supraventricular tachycardia that required ablation in 2010, he has a history of alcohol abuse, chronic pain syndrome with drug seeking behavior and high blood pressure. He presented by ambulance after his wife found the patient unresponsive. The wife states that she saw him taking a handful of pills. He states that he was snorting a crushed up form of Xanax "totem bolus" as well as taking Percocets.  He denies that this was in an act of self injury or self-harm but states that he was trying to get high and make his chronic pain go away. Narcan was given to the patient and he went from being unresponsive to having a normal mental status. This occurred just prior to arrival, the patient was acutely ill and now appears to be close to baseline according to the paramedics.  Patient is a 49 y.o. male presenting with Overdose. The history is provided by the patient and the EMS personnel.  Drug Overdose    Past Medical History  Diagnosis Date  . 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 Alcohol abuse   . Tobacco abuse   . Nephrolithiasis 04/2011    Left ureteral stone  . Chronic pain syndrome     Narcotic seeking behavior with numerous requests for pain medications, IV dilaudid  . 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 Hypertension   . Angina   . Shortness of breath   . Coronary artery disease   . GERD (gastroesophageal reflux  disease)   . Headache(784.0)   . Arthritis   . Anxiety    Past Surgical History  Procedure Laterality Date  . Cardiac catheterization  07/01/2010  . Knee arthroplasty      left   Family History  Problem Relation Age of Onset  . Diabetes Mother     No known heart disease, didn't know his father  . Other Other     grandmother-hx of palpitations  . Coronary artery disease Mother    History  Substance Use Topics  . Smoking status: Current Every Day Smoker -- 0.50 packs/day for 20 years    Types: Cigarettes  . Smokeless tobacco: Not on file     Comment: Previously 2 ppd, now 0.25 ppd  . Alcohol Use: No     Comment: States he has not drank since the ablation in 2011    Review of Systems  All other systems reviewed and are negative.     Allergies  Review of patient's allergies indicates no known allergies.  Home Medications   Prior to Admission medications   Medication Sig Start Date End Date Taking? Authorizing Provider  alprazolam Prudy Feeler) 2 MG tablet Take 2 mg by mouth 3 (three) times daily as needed (pain).   Yes Historical Provider, MD  HYDROcodone-acetaminophen (NORCO) 10-325 MG per tablet Take 1 tablet by mouth every 6 (six) hours as needed for moderate pain or severe pain (pain).    Yes Historical Provider, MD  ibuprofen (ADVIL,MOTRIN)  200 MG tablet Take 800 mg by mouth every 6 (six) hours as needed for moderate pain (pain).   Yes Historical Provider, MD   BP 126/90 mmHg  Pulse 69  Temp(Src) 98.6 F (37 C) (Oral)  Resp 18  SpO2 98% Physical Exam  Constitutional: He appears well-developed and well-nourished. No distress.  HENT:  Head: Normocephalic and atraumatic.  Mouth/Throat: Oropharynx is clear and moist. No oropharyngeal exudate.  Eyes: Conjunctivae and EOM are normal. Pupils are equal, round, and reactive to light. Right eye exhibits no discharge. Left eye exhibits no discharge. No scleral icterus.  Neck: Normal range of motion. Neck supple. No JVD  present. No thyromegaly present.  Cardiovascular: Normal rate, regular rhythm, normal heart sounds and intact distal pulses.  Exam reveals no gallop and no friction rub.   No murmur heard. Pulmonary/Chest: Effort normal and breath sounds normal. No respiratory distress. He has no wheezes. He has no rales.  Abdominal: Soft. Bowel sounds are normal. He exhibits no distension and no mass. There is no tenderness.  Musculoskeletal: Normal range of motion. He exhibits no edema or tenderness.  Lymphadenopathy:    He has no cervical adenopathy.  Neurological: He is alert. Coordination normal.  Skin: Skin is warm and dry. No rash noted. No erythema.  Psychiatric: He has a normal mood and affect. His behavior is normal.  Nursing note and vitals reviewed.   ED Course  Procedures (including critical care time) Labs Review Labs Reviewed  CBC - Abnormal; Notable for the following:    WBC 11.4 (*)    All other components within normal limits  COMPREHENSIVE METABOLIC PANEL - Abnormal; Notable for the following:    Glucose, Bld 111 (*)    All other components within normal limits  ACETAMINOPHEN LEVEL - Abnormal; Notable for the following:    Acetaminophen (Tylenol), Serum <10.0 (*)    All other components within normal limits  SALICYLATE LEVEL  ACETAMINOPHEN LEVEL    Imaging Review No results found.   EKG Interpretation   Date/Time:  Monday March 02 2015 16:45:03 EDT Ventricular Rate:  81 PR Interval:  142 QRS Duration: 95 QT Interval:  394 QTC Calculation: 457 R Axis:   73 Text Interpretation:  Sinus rhythm Normal ECG since last tracing no  significant change Confirmed by Kimbly Eanes  MD, Tabrina Esty ([id number]) on 03/02/2015  5:07:55 PM      MDM   Final diagnoses:  Overdose, undetermined intent, initial encounter    The patient has no track marks, his vital signs are normal, his abdomen is soft, his psychiatric exam shows that he is H's and mildly agitated but answering questions and  performing tasks at expected baseline. He does endorse having an overdose but states that it was not intentional to hurt himself but more to get high and deal with his chronic pain. At this time we'll check the patient for acetaminophen overdose, will need 6 hours of observation and we'll discuss with his spouse.  The patient had a negative acetaminophen level, negative salicylate level, no recurrent changes in mental status, was ambulatory and eloped without telling staff prior to a second acetaminophen level being drawn.  Eber HongBrian Lillyian Heidt, MD 03/02/15 2136

## 2015-03-02 NOTE — ED Notes (Signed)
Entered patient room, patient not in room. Patient gown laying on bed, monitoring equipment placed about the room. MD notified, charge nurse, Tiffany, notified. Unknown if patient removed his IV prior to leaving the building, no sign of IV being removed at bedside.

## 2015-03-02 NOTE — ED Notes (Signed)
Bed: WA01 Expected date:  Expected time:  Means of arrival:  Comments: EMS- found unresponsive, Narcan given, A&Ox4

## 2015-06-03 ENCOUNTER — Emergency Department (HOSPITAL_COMMUNITY): Payer: Self-pay

## 2015-06-03 ENCOUNTER — Inpatient Hospital Stay (HOSPITAL_COMMUNITY)
Admission: EM | Admit: 2015-06-03 | Discharge: 2015-06-16 | DRG: 460 | Disposition: A | Discharge status: Home / Self Care | Payer: Self-pay | Attending: Neurological Surgery | Admitting: Neurological Surgery

## 2015-06-03 ENCOUNTER — Encounter (HOSPITAL_COMMUNITY): Payer: Self-pay | Admitting: Nurse Practitioner

## 2015-06-03 ENCOUNTER — Emergency Department (HOSPITAL_COMMUNITY): Payer: MEDICAID

## 2015-06-03 DIAGNOSIS — W11XXXA Fall on and from ladder, initial encounter: Secondary | ICD-10-CM | POA: Diagnosis present

## 2015-06-03 DIAGNOSIS — M109 Gout, unspecified: Secondary | ICD-10-CM | POA: Diagnosis present

## 2015-06-03 DIAGNOSIS — S32022A Unstable burst fracture of second lumbar vertebra, initial encounter for closed fracture: Principal | ICD-10-CM | POA: Diagnosis present

## 2015-06-03 DIAGNOSIS — I4891 Unspecified atrial fibrillation: Secondary | ICD-10-CM | POA: Diagnosis present

## 2015-06-03 DIAGNOSIS — S62509A Fracture of unspecified phalanx of unspecified thumb, initial encounter for closed fracture: Secondary | ICD-10-CM | POA: Diagnosis present

## 2015-06-03 DIAGNOSIS — M1990 Unspecified osteoarthritis, unspecified site: Secondary | ICD-10-CM | POA: Diagnosis present

## 2015-06-03 DIAGNOSIS — W19XXXA Unspecified fall, initial encounter: Secondary | ICD-10-CM | POA: Diagnosis present

## 2015-06-03 DIAGNOSIS — S32000A Wedge compression fracture of unspecified lumbar vertebra, initial encounter for closed fracture: Secondary | ICD-10-CM

## 2015-06-03 DIAGNOSIS — S6991XA Unspecified injury of right wrist, hand and finger(s), initial encounter: Secondary | ICD-10-CM

## 2015-06-03 DIAGNOSIS — F17210 Nicotine dependence, cigarettes, uncomplicated: Secondary | ICD-10-CM | POA: Diagnosis present

## 2015-06-03 DIAGNOSIS — R109 Unspecified abdominal pain: Secondary | ICD-10-CM | POA: Diagnosis present

## 2015-06-03 DIAGNOSIS — S32009A Unspecified fracture of unspecified lumbar vertebra, initial encounter for closed fracture: Secondary | ICD-10-CM | POA: Diagnosis present

## 2015-06-03 DIAGNOSIS — Z981 Arthrodesis status: Secondary | ICD-10-CM

## 2015-06-03 DIAGNOSIS — S62521A Displaced fracture of distal phalanx of right thumb, initial encounter for closed fracture: Secondary | ICD-10-CM | POA: Diagnosis present

## 2015-06-03 HISTORY — DX: Gout, unspecified: M10.9

## 2015-06-03 HISTORY — DX: Unspecified atrial fibrillation: I48.91

## 2015-06-03 LAB — CBC
HCT: 42.8 % (ref 39.0–52.0)
Hemoglobin: 14.8 g/dL (ref 13.0–17.0)
MCH: 31.5 pg (ref 26.0–34.0)
MCHC: 34.6 g/dL (ref 30.0–36.0)
MCV: 91.1 fL (ref 78.0–100.0)
Platelets: 213 10*3/uL (ref 150–400)
RBC: 4.7 MIL/uL (ref 4.22–5.81)
RDW: 12.6 % (ref 11.5–15.5)
WBC: 9.1 10*3/uL (ref 4.0–10.5)

## 2015-06-03 LAB — COMPREHENSIVE METABOLIC PANEL
ALK PHOS: 61 U/L (ref 38–126)
ALT: 25 U/L (ref 17–63)
ANION GAP: 10 (ref 5–15)
AST: 32 U/L (ref 15–41)
Albumin: 4.1 g/dL (ref 3.5–5.0)
BILIRUBIN TOTAL: 0.5 mg/dL (ref 0.3–1.2)
BUN: 22 mg/dL — AB (ref 6–20)
CO2: 23 mmol/L (ref 22–32)
Calcium: 9.3 mg/dL (ref 8.9–10.3)
Chloride: 107 mmol/L (ref 101–111)
Creatinine, Ser: 1.1 mg/dL (ref 0.61–1.24)
GFR calc Af Amer: >60 mL/min (ref >60)
Glucose, Bld: 119 mg/dL — ABNORMAL HIGH (ref 65–99)
POTASSIUM: 4 mmol/L (ref 3.5–5.1)
SODIUM: 140 mmol/L (ref 135–145)
Total Protein: 6.9 g/dL (ref 6.5–8.1)

## 2015-06-03 LAB — SAMPLE TO BLOOD BANK

## 2015-06-03 LAB — I-STAT CHEM 8, ED
BUN: 26 mg/dL — ABNORMAL HIGH (ref 6–20)
CALCIUM ION: 1.15 mmol/L (ref 1.12–1.23)
CHLORIDE: 105 mmol/L (ref 101–111)
Creatinine, Ser: 1.2 mg/dL (ref 0.61–1.24)
Glucose, Bld: 115 mg/dL — ABNORMAL HIGH (ref 65–99)
HEMATOCRIT: 44 % (ref 39.0–52.0)
Hemoglobin: 15 g/dL (ref 13.0–17.0)
POTASSIUM: 3.9 mmol/L (ref 3.5–5.1)
Sodium: 141 mmol/L (ref 135–145)
TCO2: 22 mmol/L (ref 0–100)

## 2015-06-03 LAB — MRSA PCR SCREENING: MRSA by PCR: NEGATIVE

## 2015-06-03 LAB — CDS SEROLOGY

## 2015-06-03 LAB — ETHANOL: Alcohol, Ethyl (B): <5 mg/dL (ref <5)

## 2015-06-03 LAB — PROTIME-INR
INR: 1.03 (ref 0.00–1.49)
Prothrombin Time: 13.7 seconds (ref 11.6–15.2)

## 2015-06-03 MED ORDER — ONDANSETRON HCL 4 MG/2ML IJ SOLN: 4.0000 mg | INTRAMUSCULAR | Status: DC | PRN

## 2015-06-03 MED ORDER — DIPHENHYDRAMINE HCL 50 MG/ML IJ SOLN: 12.5000 mg | Freq: Four times a day (QID) | INTRAMUSCULAR | Status: DC | PRN

## 2015-06-03 MED ORDER — OXYCODONE-ACETAMINOPHEN 5-325 MG PO TABS
1.0000 | ORAL_TABLET | ORAL | Status: DC | PRN
  Administered 2015-06-03 – 2015-06-05 (×10): 2 via ORAL
  Filled 2015-06-03 (×10): qty 2

## 2015-06-03 MED ORDER — HYDROMORPHONE HCL 1 MG/ML IJ SOLN
INTRAMUSCULAR | Status: AC
  Administered 2015-06-03: 1 mg
  Filled 2015-06-03: qty 1

## 2015-06-03 MED ORDER — IOHEXOL 300 MG/ML  SOLN
100.0000 mL | Freq: Once | INTRAMUSCULAR | Status: AC | PRN
  Administered 2015-06-03: 100 mL via INTRAVENOUS

## 2015-06-03 MED ORDER — DIPHENHYDRAMINE HCL 12.5 MG/5ML PO ELIX: 12.5000 mg | ORAL_SOLUTION | Freq: Four times a day (QID) | ORAL | Status: DC | PRN

## 2015-06-03 MED ORDER — NALOXONE HCL 0.4 MG/ML IJ SOLN: 0.4000 mg | INTRAMUSCULAR | Status: DC | PRN

## 2015-06-03 MED ORDER — HYDROMORPHONE HCL 1 MG/ML IJ SOLN
1.0000 mg | Freq: Once | INTRAMUSCULAR | Status: AC
  Administered 2015-06-03: 1 mg via INTRAVENOUS
  Filled 2015-06-03: qty 1

## 2015-06-03 MED ORDER — SODIUM CHLORIDE 0.9 % IJ SOLN: 9.0000 mL | INTRAMUSCULAR | Status: DC | PRN

## 2015-06-03 MED ORDER — SODIUM CHLORIDE 0.9 % IJ SOLN: 3.0000 mL | INTRAMUSCULAR | Status: DC | PRN

## 2015-06-03 MED ORDER — POTASSIUM CHLORIDE IN NACL 20-0.9 MEQ/L-% IV SOLN
INTRAVENOUS | Status: DC
  Administered 2015-06-03 – 2015-06-07 (×7): via INTRAVENOUS
  Filled 2015-06-03 (×10): qty 1000

## 2015-06-03 MED ORDER — DIPHENHYDRAMINE HCL 50 MG/ML IJ SOLN
12.5000 mg | Freq: Four times a day (QID) | INTRAMUSCULAR | Status: DC | PRN
Start: 2015-06-03 — End: 2015-06-03

## 2015-06-03 MED ORDER — ONDANSETRON HCL 4 MG/2ML IJ SOLN: 4.0000 mg | Freq: Four times a day (QID) | INTRAMUSCULAR | Status: DC | PRN

## 2015-06-03 MED ORDER — METHOCARBAMOL 1000 MG/10ML IJ SOLN
500.0000 mg | Freq: Four times a day (QID) | INTRAVENOUS | Status: DC | PRN
  Administered 2015-06-03 – 2015-06-05 (×5): 500 mg via INTRAVENOUS
  Filled 2015-06-03 (×8): qty 5

## 2015-06-03 MED ORDER — SODIUM CHLORIDE 0.9 % IJ SOLN
3.0000 mL | Freq: Two times a day (BID) | INTRAMUSCULAR | Status: DC
  Administered 2015-06-04 – 2015-06-08 (×5): 3 mL via INTRAVENOUS

## 2015-06-03 MED ORDER — HYDROMORPHONE HCL 1 MG/ML IJ SOLN
1.0000 mg | INTRAMUSCULAR | Status: DC | PRN
  Administered 2015-06-03 (×2): 1 mg via INTRAVENOUS
  Filled 2015-06-03 (×2): qty 1

## 2015-06-03 MED ORDER — METHOCARBAMOL 500 MG PO TABS
500.0000 mg | ORAL_TABLET | Freq: Four times a day (QID) | ORAL | Status: DC | PRN
  Administered 2015-06-04 – 2015-06-08 (×13): 500 mg via ORAL
  Filled 2015-06-03 (×15): qty 1

## 2015-06-03 MED ORDER — HYDROMORPHONE 0.3 MG/ML IV SOLN: INTRAVENOUS | Status: DC

## 2015-06-03 MED ORDER — HYDROMORPHONE 0.3 MG/ML IV SOLN
INTRAVENOUS | Status: DC
  Administered 2015-06-03: 3.29 mg via INTRAVENOUS
  Administered 2015-06-03: 17:00:00 via INTRAVENOUS
  Administered 2015-06-03: 3.53 mg via INTRAVENOUS
  Administered 2015-06-04: 4.79 mg via INTRAVENOUS
  Administered 2015-06-04: 0.3 mg via INTRAVENOUS
  Filled 2015-06-03 (×3): qty 25

## 2015-06-03 MED ORDER — DIPHENHYDRAMINE HCL 12.5 MG/5ML PO ELIX
12.5000 mg | ORAL_SOLUTION | Freq: Four times a day (QID) | ORAL | Status: DC | PRN
  Filled 2015-06-03: qty 5

## 2015-06-03 MED ORDER — FENTANYL CITRATE (PF) 100 MCG/2ML IJ SOLN
100.0000 ug | INTRAMUSCULAR | Status: AC | PRN
  Administered 2015-06-03 (×3): 100 ug via INTRAVENOUS
  Filled 2015-06-03 (×3): qty 2

## 2015-06-03 MED ORDER — SODIUM CHLORIDE 0.9 % IV SOLN: 250.0000 mL | INTRAVENOUS | Status: DC

## 2015-06-03 MED ORDER — SENNA 8.6 MG PO TABS
1.0000 | ORAL_TABLET | Freq: Two times a day (BID) | ORAL | Status: DC
  Administered 2015-06-04 – 2015-06-09 (×12): 8.6 mg via ORAL
  Filled 2015-06-03 (×15): qty 1

## 2015-06-03 NOTE — ED Provider Notes (Signed)
CSN: [id number]     Arrival date & time    History   First MD Initiated Contact with Patient 06/03/15 1145     Chief Complaint  Patient presents with  . Fall     (Consider location/radiation/quality/duration/timing/severity/associated sxs/prior Treatment) HPI  49 year old male presents after a fall from approximately 30 feet on a ladder. He was working on a tree and slipped on the ladder and fell and landed on hard dirt. Witnesses state he lost consciousness for a few seconds. Denies headache or neck pain but is having severe mid and low back pain. The patient is also having abdominal pain. He has felt some numbness to his lower extremities but can move his legs. States his entire but is numb. Pain is a severe 10/10. Every time he breathes it hurts in his abdomen and back. Otherwise no chest pain.  Past Medical History  Diagnosis Date  . Atrial fibrillation   . Gout   . Arthritis    History reviewed. No pertinent past surgical history. No family history on file. History  Substance Use Topics  . Smoking status: Current Every Day Smoker -- 0.25 packs/day  . Smokeless tobacco: Not on file  . Alcohol Use: Yes    Review of Systems  Unable to perform ROS: Acuity of condition      Allergies  Review of patient's allergies indicates no known allergies.  Home Medications   Prior to Admission medications   Not on File   BP 160/90 mmHg  Pulse 89  Temp(Src) 98.5 F (36.9 C) (Oral)  Resp 20  Ht 6\' 1"  (1.854 m)  Wt 245 lb (111.131 kg)  BMI 32.33 kg/m2  SpO2 98% Physical Exam  Constitutional: He is oriented to person, place, and time. He appears well-developed and well-nourished. Cervical collar and backboard in place.  HENT:  Head: Normocephalic and atraumatic.  Right Ear: External ear normal.  Left Ear: External ear normal.  Nose: Nose normal.  Eyes: EOM are normal. Pupils are equal, round, and reactive to light. Right eye exhibits no discharge. Left eye exhibits no  discharge.  Neck: Neck supple. No spinous process tenderness and no muscular tenderness present.  Cardiovascular: Normal rate, regular rhythm, normal heart sounds and intact distal pulses.   Pulmonary/Chest: Effort normal and breath sounds normal. He has no wheezes. He has no rales. He exhibits no tenderness.  Abdominal: Soft. Normal appearance. There is generalized tenderness. There is no rigidity.  Genitourinary:  Normal rectal tone and sensation. No gross blood  Musculoskeletal: He exhibits no edema.       Thoracic back: He exhibits tenderness and bony tenderness.       Lumbar back: He exhibits tenderness and bony tenderness.  Neurological: He is alert and oriented to person, place, and time.  Normal gross sensation to lower extremities. Keeps hips and knees flexed but can move them but with severe pain in back  Skin: Skin is warm and dry.  Nursing note and vitals reviewed.   ED Course  Procedures (including critical care time) Labs Review Labs Reviewed  COMPREHENSIVE METABOLIC PANEL - Abnormal; Notable for the following:    Glucose, Bld 119 (*)    BUN 22 (*)    All other components within normal limits  I-STAT CHEM 8, ED - Abnormal; Notable for the following:    BUN 26 (*)    Glucose, Bld 115 (*)    All other components within normal limits  MRSA PCR SCREENING  CDS SEROLOGY  CBC  ETHANOL  PROTIME-INR  SAMPLE TO BLOOD BANK    Imaging Review Ct Head Wo Contrast  06/03/2015   CLINICAL DATA:  Fall 30 feet. Loss of consciousness and back pain. Headache.  EXAM: CT HEAD WITHOUT CONTRAST  CT CERVICAL SPINE WITHOUT CONTRAST  TECHNIQUE: Multidetector CT imaging of the head and cervical spine was performed following the standard protocol without intravenous contrast. Multiplanar CT image reconstructions of the cervical spine were also generated.  COMPARISON:  None.  FINDINGS: CT HEAD FINDINGS  Skull and Sinuses:Negative for fracture or destructive process.  Mild scattered inflammatory  mucosal thickening in the paranasal sinuses  Orbits: No acute abnormality.  Brain: No evidence of acute infarction, hemorrhage, hydrocephalus, or mass lesion/mass effect. Dilated perivascular space below the left putamen.  CT CERVICAL SPINE FINDINGS  Negative for acute fracture or subluxation. No prevertebral edema. No gross cervical canal hematoma.  Degenerative disc narrowing and spurring focally at C5-6. There is upper cervical facet arthropathy, greatest on the right at C2-3.  IMPRESSION: No evidence of intracranial or cervical spine injury.   Electronically Signed   By: Marnee Spring M.D.   On: 06/03/2015 12:47   Ct Cervical Spine Wo Contrast  06/03/2015   CLINICAL DATA:  Fall 30 feet. Loss of consciousness and back pain. Headache.  EXAM: CT HEAD WITHOUT CONTRAST  CT CERVICAL SPINE WITHOUT CONTRAST  TECHNIQUE: Multidetector CT imaging of the head and cervical spine was performed following the standard protocol without intravenous contrast. Multiplanar CT image reconstructions of the cervical spine were also generated.  COMPARISON:  None.  FINDINGS: CT HEAD FINDINGS  Skull and Sinuses:Negative for fracture or destructive process.  Mild scattered inflammatory mucosal thickening in the paranasal sinuses  Orbits: No acute abnormality.  Brain: No evidence of acute infarction, hemorrhage, hydrocephalus, or mass lesion/mass effect. Dilated perivascular space below the left putamen.  CT CERVICAL SPINE FINDINGS  Negative for acute fracture or subluxation. No prevertebral edema. No gross cervical canal hematoma.  Degenerative disc narrowing and spurring focally at C5-6. There is upper cervical facet arthropathy, greatest on the right at C2-3.  IMPRESSION: No evidence of intracranial or cervical spine injury.   Electronically Signed   By: Marnee Spring M.D.   On: 06/03/2015 12:47   Ct Abdomen Pelvis W Contrast  06/03/2015   CLINICAL DATA:  Back pain post 30 feet fall from ladder  EXAM: CT ABDOMEN AND PELVIS WITH  CONTRAST  TECHNIQUE: Multidetector CT imaging of the abdomen and pelvis was performed using the standard protocol following bolus administration of intravenous contrast.  CONTRAST:  OMNIPAQUE IOHEXOL 300 MG/ML  SOLN  COMPARISON:  None.  FINDINGS: Lung bases are unremarkable.  No lower rib fractures are noted.  Enhanced liver is unremarkable appearance of liver laceration. No calcified gallstones are noted within gallbladder. Atherosclerotic calcifications of abdominal aorta and common iliac arteries. No aortic aneurysm. No evidence of aortic injury.  Enhanced pancreas, spleen and adrenal glands are unremarkable. Enhanced kidneys are symmetrical in size. No hydronephrosis or hydroureter.  No thickened or dilated small bowel loops. No evidence of renal laceration. Delayed renal images shows bilateral renal symmetrical excretion. Bilateral visualized proximal ureter is unremarkable.  Moderate stool noted in right colon and transverse colon. No pericecal inflammation. Normal appendix is noted in axial image 60.  No evidence of urinary bladder injury. Prostate gland and seminal vesicles are unremarkable.  In axial image there is y shaped acute mild compression fracture anterior aspect of upper endplate of L2 vertebral body.  There is no evidence of paraspinal hematoma. The spinal canal is intact at this level. The neural foramina are patent at this level.  In axial image 41 there is significant comminuted burst compression fracture of L3 vertebral body. The fracture line is traversing the entire vertebral body. Multiple slight anterior protruding bony fragments upper endplate. There is at least 5 mm bony protrusion in spinal canal. There is moderate spinal canal stenosis. Bilateral neural foramina are intact.  There is mild comminuted compression fracture superior endplate of L4 vertebral body. There is no bony protrusion in spinal canal. Tiny anterior bony protrusion upper endplate of L4. The neuro foramina are  patent.  No sacral fracture. There is mild widening of facet joint space bilaterally at L3 L4 and L5 level best seen on sagittal images 86 and 87.  Coronal images shows bilateral hip joint symmetrical in appearance. No hip fracture is noted. Bilateral proximal femur is unremarkable.  IMPRESSION: 1. Mild comminuted compression fracture upper endplate of L2 and L4 vertebral bodies. No spinal canal stenosis or foraminal stenosis at this level. There is significant comminuted burst compression fracture of L3 vertebral body. There is about 5 mm bony protrusion in spinal canal with moderate spinal canal stenosis. No neuroforaminal stenosis at this level. Mild symmetrical widening of facet joint space at L3, L4 and L5 level. Question fragmented osteophytes right facets at L4 level. Please see sagittal image 63. 2. No acute visceral injury within abdomen or pelvis. 3. No hydronephrosis or hydroureter. Bilateral renal symmetrical excretion. 4. Normal appendix.  No pericecal inflammation. 5. No evidence of urinary bladder injury. 6. No pelvic fractures are identified.  No lower rib fractures. These results were called by telephone at the time of interpretation on 06/03/2015 at 1:06 pm to Dr. Pricilla Loveless , who verbally acknowledged these results.   Electronically Signed   By: Natasha Mead M.D.   On: 06/03/2015 13:06   Dg Pelvis Portable  06/03/2015   CLINICAL DATA:  Patient fell 30 feet from tree  EXAM: PORTABLE PELVIS 1-2 VIEWS  COMPARISON:  None.  FINDINGS: There is no demonstrable fracture or dislocation. Joint spaces appear intact. No erosive change.  IMPRESSION: No fracture or dislocation.  No appreciable arthropathy.   Electronically Signed   By: Bretta Bang III M.D.   On: 06/03/2015 12:31   Dg Chest Portable 1 View  06/03/2015   CLINICAL DATA:  Patient fell 30 feet off tree  EXAM: PORTABLE CHEST - 1 VIEW  COMPARISON:  None.  FINDINGS: Lungs clear. Heart size and pulmonary vascularity are normal. Mediastinal  contours are within normal limits. No adenopathy. No pneumothorax. No bone lesions.  IMPRESSION: Lungs clear.  No pneumothorax apparent.   Electronically Signed   By: Bretta Bang III M.D.   On: 06/03/2015 12:29   Dg Thoracic Spine 1vclearing  06/03/2015   CLINICAL DATA:  Status post fall from a tree. Upper lumbar pain. Initial encounter.  EXAM: Lateral views of the thoracic spine.  COMPARISON:  CT abdomen and pelvis earlier this same day.  FINDINGS: Vertebral body height appears maintained throughout the thoracic spine. L2 and L3 compression fractures seen on CT scan are noted. Anterior endplate spurring is seen at T11-12.  IMPRESSION: Negative for thoracic spine fracture.  L2 and L3 compression fractures as seen on prior CT.   Electronically Signed   By: Drusilla Kanner M.D.   On: 06/03/2015 13:45   Dg Lumbar Spine 2-3vclearing  06/03/2015   CLINICAL DATA:  Status  post fall out of a tree.  EXAM: LIMITED LUMBAR SPINE FOR TRAUMA CLEARING - 2-3 VIEW  COMPARISON:  None.  FINDINGS: Clearing cross-table lateral radiograph shows no definite evidence of lumbar spine fracture or subluxation. Note that this is not a complete radiographic evaluation.  There are 5 nonrib bearing lumbar-type vertebral bodies. There is a mild compression fracture of the L2 vertebral body. There is a mildly comminuted compression fracture of the L4 vertebral body with approximately 50% height loss. There is a small fracture involving the anterior superior corner of the L4 vertebral body.The alignment is anatomic. There is no spondylolysis. The disc spaces are maintained. The SI joints are unremarkable.  IMPRESSION: 1. Mild compression fracture of the L2 vertebral body. 2. Mildly comminuted compression fracture of the L4 vertebral body with approximately 50% height loss. 3. Small fracture involving the anterior superior corner of the L4 vertebral body.   Electronically Signed   By: Elige Ko   On: 06/03/2015 13:44     EKG  Interpretation None      MDM   Final diagnoses:  Lumbar vertebral fracture, closed, initial encounter    Patient is a level 2 trauma based on fall height. Trauma scans show lumbar fractures as above. Describing leg numbness but no hard findings on neuro exam with normal rectal tone. No intra-abdominal trauma. Dr. Yetta Barre of neurosurgery will admit.    Pricilla Loveless, MD 06/03/15 [phone]

## 2015-06-03 NOTE — Progress Notes (Signed)
Dr. Yetta Barre called regarding lack of pain control. States patient will have break through pain. No new orders given. Will continue to monitor.

## 2015-06-03 NOTE — ED Notes (Signed)
Patient's ear rings given to his wife

## 2015-06-03 NOTE — Progress Notes (Signed)
Chaplain responded to level two trauma. Chaplain escorted pt wife to waiting area, provided beverages and snack, provided emotional support, and facilitated communication between medical team and family. Chaplain escorted pt family to bedside.    06/03/15 1200  Clinical Encounter Type  Visited With Family;Health care provider  Visit Type Trauma;ED;Spiritual support  Referral From Nurse  Spiritual Encounters  Spiritual Needs Emotional  Stress Factors  Family Stress Factors Exhausted;Health changes;Major life changes  Bobby Horn 06/03/2015 12:49 PM

## 2015-06-03 NOTE — ED Notes (Signed)
MD at bedside. Dr.Jones

## 2015-06-03 NOTE — ED Notes (Signed)
Portable at bedside 

## 2015-06-03 NOTE — ED Notes (Signed)
Per EMS Pt was 30 feet up on tree when ladder slipped and patient fell straight on back. Bystanders report LOC for unknown amount of time. Pt endorses 10/10 back pain. Able to move all extremities. Pt has decreased sensation in bilateral lower extremites. Pt had 2+ pedal pulses. Denies headache. Alert and oriented x4.

## 2015-06-03 NOTE — H&P (Signed)
Subjective: Patient is a 49 y.o. male who complains of severe back pain after 30 foot fall today. Onset of symptoms was a few hours ago, unchanged since that time.  Onset was related to no known injury and a fall. The pain is rated intense, and is located at the across the lower back. The pain is described as aching and occurs all day. The symptoms denies been progressive. Symptoms are exacerbated by nothing in particular. The patient has tried IV meds. CT showed L2 burst fracture. Denies weakness, some NT in lateral legs, no BB changes.   Past Medical History  Diagnosis Date  . Atrial fibrillation   . Gout   . Arthritis     History reviewed. No pertinent past surgical history.  No Known Allergies  History  Substance Use Topics  . Smoking status: Current Every Day Smoker -- 0.25 packs/day  . Smokeless tobacco: Not on file  . Alcohol Use: Yes    No family history on file. Prior to Admission medications   Medication Sig Start Date End Date Taking? Authorizing Provider  alprazolam Prudy Feeler) 2 MG tablet Take 2 mg by mouth 3 (three) times daily as needed for sleep or anxiety.   Yes Historical Provider, MD  HYDROcodone-acetaminophen (NORCO) 10-325 MG per tablet Take 1 tablet by mouth every 6 (six) hours as needed for moderate pain or severe pain.    Yes Historical Provider, MD  ibuprofen (ADVIL,MOTRIN) 200 MG tablet Take 800 mg by mouth every 6 (six) hours as needed for moderate pain (for gout).    Yes Historical Provider, MD     Review of Systems  Positive ROS: neg  All other systems have been reviewed and were otherwise negative with the exception of those mentioned in the HPI and as above.  Objective: Vital signs in last 24 hours: Temp:  [98.5 F (36.9 C)] 98.5 F (36.9 C) (06/15 1136) Pulse Rate:  [77-89] 84 (06/15 1400) Resp:  [13-22] 17 (06/15 1400) BP: (120-160)/(73-97) 120/73 mmHg (06/15 1400) SpO2:  [98 %-100 %] 100 % (06/15 1400) Weight:  [245 lb (111.131 kg)] 245 lb  (111.131 kg) (06/15 1136)  General Appearance: Alert, cooperative, moderate distress, appears stated age Head: Normocephalic, without obvious abnormality, atraumatic Eyes: PERRL, conjunctiva/corneas clear, EOM's intact     Ears: Normal TM's and external ear canals, both ears Throat: Lips, mucosa, and tongue normal; teeth and gums normal Neck: Supple, symmetrical, trachea midline, non tender  Lungs:respirations unlabored Heart: Regular rate and rhythm Skin: Skin color, texture, turgor normal, no rashes or lesions  NEUROLOGIC:   Mental status: alert and oriented, no aphasia, good attention span, Fund of knowledge/ memory ok Motor Exam - grossly normal to in bed exam Sensory Exam - grossly normal Reflexes: 1+ Coordination - grossly normal Gait - not tested Balance - not tested Cranial Nerves: I: smell Not tested  II: visual acuity  OS: na    OD: na  II: visual fields Full to confrontation  II: pupils Equal, round, reactive to light  III,VII: ptosis None  III,IV,VI: extraocular muscles  Full ROM  V: mastication Normal  V: facial light touch sensation  Normal  V,VII: corneal reflex  Present  VII: facial muscle function - upper  Normal  VII: facial muscle function - lower Normal  VIII: hearing Not tested  IX: soft palate elevation  Normal  IX,X: gag reflex Present  XI: trapezius strength  5/5  XI: sternocleidomastoid strength 5/5  XI: neck flexion strength  5/5  XII:  tongue strength  Normal    Data Review Lab Results  Component Value Date   WBC 9.1 06/03/2015   HGB 14.8 06/03/2015   HCT 42.8 06/03/2015   MCV 91.1 06/03/2015   PLT 213 06/03/2015   Lab Results  Component Value Date   NA 140 06/03/2015   K 4.0 06/03/2015   CL 107 06/03/2015   CO2 23 06/03/2015   BUN 22* 06/03/2015   CREATININE 1.10 06/03/2015   GLUCOSE 119* 06/03/2015   Lab Results  Component Value Date   INR 1.03 06/03/2015    Assessment/Plan: 49 year old gentleman who fell 30 feet off a  ladder today and has suffered an L2 burst fracture. There are minor anterior chip fractures off of L1 and L3 without loss of vertebral body height. There is mild retropulsion of bone fragments at L2. This causes mild canal narrowing. It is a 2 column fracture without involvement of the posterior elements based on current imaging. He appears to be neurologically intact at this time. I have discussed the imaging findings with my partner Dr. Lovell Sheehan who agrees that we should watch this and treat him in a brace for the short run. If he has severe pain upon mobilization and cannot tolerate mobilization in a brace or if he has progression of the fracture then he may need surgical stabilization with pedicle screw fixation L1-L5. We will try to manage his pain is best we can. We will order a TLSO brace and try to mobilize him in that. He will be admitted for pain management and observation and further treatment of the fracture. All of this has been discussed with the patient and his family. I have tried to answer their questions.   Gerard Bonus S 06/03/2015 2:19 PM

## 2015-06-03 NOTE — ED Notes (Signed)
Family at bedside. Pt returns from radiology, placed back on cardiac, bp and o2 monitoring.

## 2015-06-03 NOTE — Consult Note (Signed)
Reason for Consult:Trauma/Fall from height Referring Physician: Tommy Goostree is an 49 y.o. male.  HPI: Bobby Horn fell from ladder, reportedly 30 feet, onto a pile of wood.  As he describes it, he fell directly onto his back, somewhat on the left side.  No LOC, excruciating back pain.  After workup in the ED as a level II activation, patient was found to have what appears to be an isolated back injury.  Neurosurgery has asked [place] to consult for overall trauma evaluation.  Past Medical History  Diagnosis Date  . Atrial fibrillation   . Gout   . Arthritis     History reviewed. No pertinent past surgical history.  No family history on file.  Social History:  reports that he has been smoking.  He does not have any smokeless tobacco history on file. He reports that he drinks alcohol. His drug history is not on file.  Allergies: No Known Allergies  Medications: I have reviewed the patient's current medications.  Results for orders placed or performed during the hospital encounter of 06/03/15 (from the past 48 hour(s))  Sample to Blood Bank     Status: None   Collection Time: 06/03/15 11:40 AM  Result Value Ref Range   Blood Bank Specimen SAMPLE AVAILABLE FOR TESTING    Sample Expiration 06/04/2015   I-Stat Chem 8, ED  (not at [facility], [facility])     Status: Abnormal   Collection Time: 06/03/15 11:50 AM  Result Value Ref Range   Sodium 141 135 - 145 mmol/L   Potassium 3.9 3.5 - 5.1 mmol/L   Chloride 105 101 - 111 mmol/L   BUN 26 (H) 6 - 20 mg/dL   Creatinine, Ser 1.20 0.61 - 1.24 mg/dL   Glucose, Bld 115 (H) 65 - 99 mg/dL   Calcium, Ion 1.15 1.12 - 1.23 mmol/L   TCO2 22 0 - 100 mmol/L   Hemoglobin 15.0 13.0 - 17.0 g/dL   HCT 44.0 39.0 - 52.0 %  CDS serology     Status: None   Collection Time: 06/03/15 11:51 AM  Result Value Ref Range   CDS serology specimen STAT   Comprehensive metabolic panel     Status: Abnormal   Collection Time: 06/03/15 11:51 AM  Result Value Ref Range    Sodium 140 135 - 145 mmol/L   Potassium 4.0 3.5 - 5.1 mmol/L   Chloride 107 101 - 111 mmol/L   CO2 23 22 - 32 mmol/L   Glucose, Bld 119 (H) 65 - 99 mg/dL   BUN 22 (H) 6 - 20 mg/dL   Creatinine, Ser 1.10 0.61 - 1.24 mg/dL   Calcium 9.3 8.9 - 10.3 mg/dL   Total Protein 6.9 6.5 - 8.1 g/dL   Albumin 4.1 3.5 - 5.0 g/dL   AST 32 15 - 41 U/L   ALT 25 17 - 63 U/L   Alkaline Phosphatase 61 38 - 126 U/L   Total Bilirubin 0.5 0.3 - 1.2 mg/dL   GFR calc non Af Amer >60 >60 mL/min   GFR calc Af Amer >60 >60 mL/min    Comment: (NOTE) The eGFR has been calculated using the CKD EPI equation. This calculation has not been validated in all clinical situations. eGFR's persistently <60 mL/min signify possible Chronic Kidney Disease.    Anion gap 10 5 - 15  CBC     Status: None   Collection Time: 06/03/15 11:51 AM  Result Value Ref Range   WBC 9.1 4.0 -  10.5 K/uL   RBC 4.70 4.22 - 5.81 MIL/uL   Hemoglobin 14.8 13.0 - 17.0 g/dL   HCT 42.8 39.0 - 52.0 %   MCV 91.1 78.0 - 100.0 fL   MCH 31.5 26.0 - 34.0 pg   MCHC 34.6 30.0 - 36.0 g/dL   RDW 12.6 11.5 - 15.5 %   Platelets 213 150 - 400 K/uL  Protime-INR     Status: None   Collection Time: 06/03/15 11:51 AM  Result Value Ref Range   Prothrombin Time 13.7 11.6 - 15.2 seconds   INR 1.03 0.00 - 1.49  Ethanol     Status: None   Collection Time: 06/03/15 11:53 AM  Result Value Ref Range   Alcohol, Ethyl (B) <5 <5 mg/dL    Comment:        LOWEST DETECTABLE LIMIT FOR SERUM ALCOHOL IS 5 mg/dL FOR MEDICAL PURPOSES ONLY     Ct Head Wo Contrast  06/03/2015   CLINICAL DATA:  Fall 30 feet. Loss of consciousness and back pain. Headache.  EXAM: CT HEAD WITHOUT CONTRAST  CT CERVICAL SPINE WITHOUT CONTRAST  TECHNIQUE: Multidetector CT imaging of the head and cervical spine was performed following the standard protocol without intravenous contrast. Multiplanar CT image reconstructions of the cervical spine were also generated.  COMPARISON:  None.   FINDINGS: CT HEAD FINDINGS  Skull and Sinuses:Negative for fracture or destructive process.  Mild scattered inflammatory mucosal thickening in the paranasal sinuses  Orbits: No acute abnormality.  Brain: No evidence of acute infarction, hemorrhage, hydrocephalus, or mass lesion/mass effect. Dilated perivascular space below the left putamen.  CT CERVICAL SPINE FINDINGS  Negative for acute fracture or subluxation. No prevertebral edema. No gross cervical canal hematoma.  Degenerative disc narrowing and spurring focally at C5-6. There is upper cervical facet arthropathy, greatest on the right at C2-3.  IMPRESSION: No evidence of intracranial or cervical spine injury.   Electronically Signed   By: Monte Fantasia M.D.   On: 06/03/2015 12:47   Ct Cervical Spine Wo Contrast  06/03/2015   CLINICAL DATA:  Fall 30 feet. Loss of consciousness and back pain. Headache.  EXAM: CT HEAD WITHOUT CONTRAST  CT CERVICAL SPINE WITHOUT CONTRAST  TECHNIQUE: Multidetector CT imaging of the head and cervical spine was performed following the standard protocol without intravenous contrast. Multiplanar CT image reconstructions of the cervical spine were also generated.  COMPARISON:  None.  FINDINGS: CT HEAD FINDINGS  Skull and Sinuses:Negative for fracture or destructive process.  Mild scattered inflammatory mucosal thickening in the paranasal sinuses  Orbits: No acute abnormality.  Brain: No evidence of acute infarction, hemorrhage, hydrocephalus, or mass lesion/mass effect. Dilated perivascular space below the left putamen.  CT CERVICAL SPINE FINDINGS  Negative for acute fracture or subluxation. No prevertebral edema. No gross cervical canal hematoma.  Degenerative disc narrowing and spurring focally at C5-6. There is upper cervical facet arthropathy, greatest on the right at C2-3.  IMPRESSION: No evidence of intracranial or cervical spine injury.   Electronically Signed   By: Monte Fantasia M.D.   On: 06/03/2015 12:47   Ct Abdomen  Pelvis W Contrast  06/03/2015   CLINICAL DATA:  Back pain post 30 feet fall from ladder  EXAM: CT ABDOMEN AND PELVIS WITH CONTRAST  TECHNIQUE: Multidetector CT imaging of the abdomen and pelvis was performed using the standard protocol following bolus administration of intravenous contrast.  CONTRAST:  153m OMNIPAQUE IOHEXOL 300 MG/ML  SOLN  COMPARISON:  None.  FINDINGS: Lung  bases are unremarkable.  No lower rib fractures are noted.  Enhanced liver is unremarkable appearance of liver laceration. No calcified gallstones are noted within gallbladder. Atherosclerotic calcifications of abdominal aorta and common iliac arteries. No aortic aneurysm. No evidence of aortic injury.  Enhanced pancreas, spleen and adrenal glands are unremarkable. Enhanced kidneys are symmetrical in size. No hydronephrosis or hydroureter.  No thickened or dilated small bowel loops. No evidence of renal laceration. Delayed renal images shows bilateral renal symmetrical excretion. Bilateral visualized proximal ureter is unremarkable.  Moderate stool noted in right colon and transverse colon. No pericecal inflammation. Normal appendix is noted in axial image 60.  No evidence of urinary bladder injury. Prostate gland and seminal vesicles are unremarkable.  In axial image there is y shaped acute mild compression fracture anterior aspect of upper endplate of L2 vertebral body. There is no evidence of paraspinal hematoma. The spinal canal is intact at this level. The neural foramina are patent at this level.  In axial image 41 there is significant comminuted burst compression fracture of L3 vertebral body. The fracture line is traversing the entire vertebral body. Multiple slight anterior protruding bony fragments upper endplate. There is at least 5 mm bony protrusion in spinal canal. There is moderate spinal canal stenosis. Bilateral neural foramina are intact.  There is mild comminuted compression fracture superior endplate of L4 vertebral body.  There is no bony protrusion in spinal canal. Tiny anterior bony protrusion upper endplate of L4. The neuro foramina are patent.  No sacral fracture. There is mild widening of facet joint space bilaterally at L3 L4 and L5 level best seen on sagittal images 86 and 87.  Coronal images shows bilateral hip joint symmetrical in appearance. No hip fracture is noted. Bilateral proximal femur is unremarkable.  IMPRESSION: 1. Mild comminuted compression fracture upper endplate of L2 and L4 vertebral bodies. No spinal canal stenosis or foraminal stenosis at this level. There is significant comminuted burst compression fracture of L3 vertebral body. There is about 5 mm bony protrusion in spinal canal with moderate spinal canal stenosis. No neuroforaminal stenosis at this level. Mild symmetrical widening of facet joint space at L3, L4 and L5 level. Question fragmented osteophytes right facets at L4 level. Please see sagittal image 63. 2. No acute visceral injury within abdomen or pelvis. 3. No hydronephrosis or hydroureter. Bilateral renal symmetrical excretion. 4. Normal appendix.  No pericecal inflammation. 5. No evidence of urinary bladder injury. 6. No pelvic fractures are identified.  No lower rib fractures. These results were called by telephone at the time of interpretation on 06/03/2015 at 1:06 pm to Dr. Sherwood Gambler , who verbally acknowledged these results.   Electronically Signed   By: Lahoma Crocker M.D.   On: 06/03/2015 13:06   Dg Pelvis Portable  06/03/2015   CLINICAL DATA:  Patient fell 30 feet from tree  EXAM: PORTABLE PELVIS 1-2 VIEWS  COMPARISON:  None.  FINDINGS: There is no demonstrable fracture or dislocation. Joint spaces appear intact. No erosive change.  IMPRESSION: No fracture or dislocation.  No appreciable arthropathy.   Electronically Signed   By: Lowella Grip III M.D.   On: 06/03/2015 12:31   Dg Chest Portable 1 View  06/03/2015   CLINICAL DATA:  Patient fell 30 feet off tree  EXAM: PORTABLE  CHEST - 1 VIEW  COMPARISON:  None.  FINDINGS: Lungs clear. Heart size and pulmonary vascularity are normal. Mediastinal contours are within normal limits. No adenopathy. No pneumothorax. No bone lesions.  IMPRESSION: Lungs clear.  No pneumothorax apparent.   Electronically Signed   By: Lowella Grip III M.D.   On: 06/03/2015 12:29   Dg Thoracic Spine 1vclearing  06/03/2015   CLINICAL DATA:  Status post fall from a tree. Upper lumbar pain. Initial encounter.  EXAM: Lateral views of the thoracic spine.  COMPARISON:  CT abdomen and pelvis earlier this same day.  FINDINGS: Vertebral body height appears maintained throughout the thoracic spine. L2 and L3 compression fractures seen on CT scan are noted. Anterior endplate spurring is seen at T11-12.  IMPRESSION: Negative for thoracic spine fracture.  L2 and L3 compression fractures as seen on prior CT.   Electronically Signed   By: Inge Rise M.D.   On: 06/03/2015 13:45   Dg Lumbar Spine 2-3vclearing  06/03/2015   CLINICAL DATA:  Status post fall out of a tree.  EXAM: LIMITED LUMBAR SPINE FOR TRAUMA CLEARING - 2-3 VIEW  COMPARISON:  None.  FINDINGS: Clearing cross-table lateral radiograph shows no definite evidence of lumbar spine fracture or subluxation. Note that this is not a complete radiographic evaluation.  There are 5 nonrib bearing lumbar-type vertebral bodies. There is a mild compression fracture of the L2 vertebral body. There is a mildly comminuted compression fracture of the L4 vertebral body with approximately 50% height loss. There is a small fracture involving the anterior superior corner of the L4 vertebral body.The alignment is anatomic. There is no spondylolysis. The disc spaces are maintained. The SI joints are unremarkable.  IMPRESSION: 1. Mild compression fracture of the L2 vertebral body. 2. Mildly comminuted compression fracture of the L4 vertebral body with approximately 50% height loss. 3. Small fracture involving the anterior  superior corner of the L4 vertebral body.   Electronically Signed   By: Kathreen Devoid   On: 06/03/2015 13:44    ROS Blood pressure 118/62, pulse 86, temperature 98.5 F (36.9 C), temperature source Oral, resp. rate 18, height 6' 1" (1.854 m), weight 111.131 kg (245 lb), SpO2 99 %. Physical Exam  Constitutional: He is oriented to person, place, and time. He appears well-developed and well-nourished.  HENT:  Head: Normocephalic and atraumatic.  Eyes: Conjunctivae and EOM are normal. Pupils are equal, round, and reactive to light.  Neck: Normal range of motion. Neck supple.  Cardiovascular: Normal rate, regular rhythm and normal heart sounds.   History of SVT/Afib ablated 5 years ago  Respiratory: Effort normal and breath sounds normal. He exhibits no tenderness and no crepitus.  GI: Soft. Bowel sounds are normal. There is tenderness in the left lower quadrant. There is no rigidity, no rebound and no guarding.  Genitourinary: Penis normal.  Musculoskeletal: Normal range of motion.  Neurological: He is alert and oriented to person, place, and time. He has normal reflexes.  Neurologically intact  Skin: Skin is warm.  Psychiatric: He has a normal mood and affect. His behavior is normal. Judgment and thought content normal.    Assessment/Plan: Fall from height onto back, isolated L3 burst fracture with retropulsion, but neurologically intact History of chronic back pain--takes hydrocodone prior to admission  I have reviewed all the patient's X-rays and scans and find no other evidence of injury.  His left sided abdominal pain is mild, and he has normal bowel sounds and a good appetite.  I doubt if there is any significant abdominal injury.  He is at some risk for an ileus because of his back fracture and getting narcotics--for this reason we will continue to follow  the patient.  He is clear from our standpoint to have surgery if needed.  Arthi Mcdonald 06/03/2015, 5:19 PM

## 2015-06-03 NOTE — Progress Notes (Signed)
[employer] came with TLSO brace.  Incorrect size.  [employer] will be back tomorrow after new shipment comes in with correct sized brace.  Estimated time will be after 1130 on 6/16

## 2015-06-04 ENCOUNTER — Inpatient Hospital Stay (HOSPITAL_COMMUNITY): Payer: Self-pay

## 2015-06-04 DIAGNOSIS — S62509A Fracture of unspecified phalanx of unspecified thumb, initial encounter for closed fracture: Secondary | ICD-10-CM | POA: Diagnosis present

## 2015-06-04 MED ORDER — NALOXONE HCL 0.4 MG/ML IJ SOLN: 0.4000 mg | INTRAMUSCULAR | Status: DC | PRN

## 2015-06-04 MED ORDER — DIPHENHYDRAMINE HCL 50 MG/ML IJ SOLN
12.5000 mg | Freq: Four times a day (QID) | INTRAMUSCULAR | Status: DC | PRN
  Administered 2015-06-06: 12.5 mg via INTRAVENOUS
  Filled 2015-06-04: qty 1

## 2015-06-04 MED ORDER — KETOROLAC TROMETHAMINE 30 MG/ML IJ SOLN
30.0000 mg | Freq: Once | INTRAMUSCULAR | Status: AC
  Administered 2015-06-04: 30 mg via INTRAVENOUS
  Filled 2015-06-04: qty 1

## 2015-06-04 MED ORDER — SODIUM CHLORIDE 0.9 % IJ SOLN: 9.0000 mL | INTRAMUSCULAR | Status: DC | PRN

## 2015-06-04 MED ORDER — HYDROMORPHONE 0.3 MG/ML IV SOLN
INTRAVENOUS | Status: DC
  Administered 2015-06-04: via INTRAVENOUS
  Administered 2015-06-04: 3.3 mg via INTRAVENOUS
  Administered 2015-06-04: 3 mg via INTRAVENOUS
  Administered 2015-06-04: 14:00:00 via INTRAVENOUS
  Administered 2015-06-04: 4.1 mg via INTRAVENOUS
  Administered 2015-06-05: 4.3 mg via INTRAVENOUS
  Administered 2015-06-05: 17:00:00 via INTRAVENOUS
  Administered 2015-06-05: 3 mg via INTRAVENOUS
  Administered 2015-06-05 – 2015-06-06 (×2): via INTRAVENOUS
  Administered 2015-06-06: 7.74 mg via INTRAVENOUS
  Administered 2015-06-06: 6.6 mg via INTRAVENOUS
  Administered 2015-06-06: 1.8 mg via INTRAVENOUS
  Administered 2015-06-06: 23:00:00 via INTRAVENOUS
  Administered 2015-06-06: 1.91 mg via INTRAVENOUS
  Administered 2015-06-06: 0.3 mg via INTRAVENOUS
  Administered 2015-06-07: 7.5 mg via INTRAVENOUS
  Administered 2015-06-07: 4.8 mg via INTRAVENOUS
  Filled 2015-06-04 (×9): qty 25

## 2015-06-04 MED ORDER — DIPHENHYDRAMINE HCL 12.5 MG/5ML PO ELIX
12.5000 mg | ORAL_SOLUTION | Freq: Four times a day (QID) | ORAL | Status: DC | PRN
  Filled 2015-06-04: qty 5

## 2015-06-04 MED ORDER — ONDANSETRON HCL 4 MG/2ML IJ SOLN: 4.0000 mg | Freq: Four times a day (QID) | INTRAMUSCULAR | Status: DC | PRN

## 2015-06-04 MED ORDER — KETOROLAC TROMETHAMINE 30 MG/ML IJ SOLN
30.0000 mg | Freq: Four times a day (QID) | INTRAMUSCULAR | Status: AC | PRN
  Administered 2015-06-04 – 2015-06-09 (×16): 30 mg via INTRAVENOUS
  Filled 2015-06-04 (×16): qty 1

## 2015-06-04 NOTE — Care Management Note (Signed)
Case Management Note  Patient Details  Name: Bobby Horn MRN: [id number] Date of Birth: 1966-07-16  Subjective/Objective:     Pt s/p fall from ladder with L2 burst fracture.  PTA, pt independent, lives with spouse.             Action/Plan: Will follow for discharge needs as pt progresses.  UR completed.    Expected Discharge Date:                  Expected Discharge Plan:  IP Rehab Facility  In-House Referral:     Discharge planning Services  CM Consult  Post Acute Care Choice:    Choice offered to:     DME Arranged:    DME Agency:     HH Arranged:    HH Agency:     Status of Service:  In process, will continue to follow  Medicare Important Message Given:    Date Medicare IM Given:    Medicare IM give by:    Date Additional Medicare IM Given:    Additional Medicare Important Message give by:     If discussed at Long Length of Stay Meetings, dates discussed:    Additional Comments:  Quintella Baton, RN, BSN  Trauma/Neuro ICU Case Manager [phone]

## 2015-06-04 NOTE — Progress Notes (Signed)
Pt /family interested in Medicaid/disability as he is primary income for family.  Referral to financial counselor to evaluate for Medicaid/disability benefits.  (message left for [facility]).    Quintella Baton, RN, BSN  Trauma/Neuro ICU Case Manager [phone]

## 2015-06-04 NOTE — Progress Notes (Signed)
Orthopedic Tech Progress Note Patient Details:  Bobby Horn 1966-04-01 [id number]  Ortho Devices Type of Ortho Device: Finger splint Ortho Device/Splint Location: RUE thumb Ortho Device/Splint Interventions: Ordered, Application   Bobby Horn 06/04/2015, 3:29 PM

## 2015-06-04 NOTE — Progress Notes (Signed)
Trauma Service Note  Subjective: Patient complaining of a lot of back and right thumb pain.  Objective: Vital signs in last 24 hours: Temp:  [97.3 F (36.3 C)-100.1 F (37.8 C)] 97.3 F (36.3 C) (06/16 0753) Pulse Rate:  [77-91] 81 (06/16 0400) Resp:  [9-22] 9 (06/16 0752) BP: (116-160)/(62-105) 142/105 mmHg (06/16 0400) SpO2:  [95 %-100 %] 100 % (06/16 0752) Weight:  [111.131 kg (245 lb)] 111.131 kg (245 lb) (06/15 1136)    Intake/Output from previous day: 06/15 0701 - 06/16 0700 In: 2098.8 [P.O.:480; I.V.:1508.8; IV Piggyback:110] Out: 900 [Urine:900] Intake/Output this shift:    General: Right thumb pain, back pain.  Moderate distress.  Lungs: Clear  Abd: Soft, excellent bowel sounds.  Extremities: Right distal PIP joint tenderness and swelling, limited ROM  Neuro: Intact  Lab Results: CBC   Recent Labs  06/03/15 1150 06/03/15 1151  WBC  --  9.1  HGB 15.0 14.8  HCT 44.0 42.8  PLT  --  213   BMET  Recent Labs  06/03/15 1150 06/03/15 1151  NA 141 140  K 3.9 4.0  CL 105 107  CO2  --  23  GLUCOSE 115* 119*  BUN 26* 22*  CREATININE 1.20 1.10  CALCIUM  --  9.3   PT/INR  Recent Labs  06/03/15 1151  LABPROT 13.7  INR 1.03   ABG No results for input(s): PHART, HCO3 in the last 72 hours.  Invalid input(s): PCO2, PO2  Studies/Results: Ct Head Wo Contrast  06/03/2015   CLINICAL DATA:  Fall 30 feet. Loss of consciousness and back pain. Headache.  EXAM: CT HEAD WITHOUT CONTRAST  CT CERVICAL SPINE WITHOUT CONTRAST  TECHNIQUE: Multidetector CT imaging of the head and cervical spine was performed following the standard protocol without intravenous contrast. Multiplanar CT image reconstructions of the cervical spine were also generated.  COMPARISON:  None.  FINDINGS: CT HEAD FINDINGS  Skull and Sinuses:Negative for fracture or destructive process.  Mild scattered inflammatory mucosal thickening in the paranasal sinuses  Orbits: No acute abnormality.   Brain: No evidence of acute infarction, hemorrhage, hydrocephalus, or mass lesion/mass effect. Dilated perivascular space below the left putamen.  CT CERVICAL SPINE FINDINGS  Negative for acute fracture or subluxation. No prevertebral edema. No gross cervical canal hematoma.  Degenerative disc narrowing and spurring focally at C5-6. There is upper cervical facet arthropathy, greatest on the right at C2-3.  IMPRESSION: No evidence of intracranial or cervical spine injury.   Electronically Signed   By: Marnee Spring M.D.   On: 06/03/2015 12:47   Ct Cervical Spine Wo Contrast  06/03/2015   CLINICAL DATA:  Fall 30 feet. Loss of consciousness and back pain. Headache.  EXAM: CT HEAD WITHOUT CONTRAST  CT CERVICAL SPINE WITHOUT CONTRAST  TECHNIQUE: Multidetector CT imaging of the head and cervical spine was performed following the standard protocol without intravenous contrast. Multiplanar CT image reconstructions of the cervical spine were also generated.  COMPARISON:  None.  FINDINGS: CT HEAD FINDINGS  Skull and Sinuses:Negative for fracture or destructive process.  Mild scattered inflammatory mucosal thickening in the paranasal sinuses  Orbits: No acute abnormality.  Brain: No evidence of acute infarction, hemorrhage, hydrocephalus, or mass lesion/mass effect. Dilated perivascular space below the left putamen.  CT CERVICAL SPINE FINDINGS  Negative for acute fracture or subluxation. No prevertebral edema. No gross cervical canal hematoma.  Degenerative disc narrowing and spurring focally at C5-6. There is upper cervical facet arthropathy, greatest on the right at C2-3.  IMPRESSION: No evidence of intracranial or cervical spine injury.   Electronically Signed   By: Marnee Spring M.D.   On: 06/03/2015 12:47   Ct Abdomen Pelvis W Contrast  06/03/2015   CLINICAL DATA:  Back pain post 30 feet fall from ladder  EXAM: CT ABDOMEN AND PELVIS WITH CONTRAST  TECHNIQUE: Multidetector CT imaging of the abdomen and pelvis was  performed using the standard protocol following bolus administration of intravenous contrast.  CONTRAST:  OMNIPAQUE IOHEXOL 300 MG/ML  SOLN  COMPARISON:  None.  FINDINGS: Lung bases are unremarkable.  No lower rib fractures are noted.  Enhanced liver is unremarkable appearance of liver laceration. No calcified gallstones are noted within gallbladder. Atherosclerotic calcifications of abdominal aorta and common iliac arteries. No aortic aneurysm. No evidence of aortic injury.  Enhanced pancreas, spleen and adrenal glands are unremarkable. Enhanced kidneys are symmetrical in size. No hydronephrosis or hydroureter.  No thickened or dilated small bowel loops. No evidence of renal laceration. Delayed renal images shows bilateral renal symmetrical excretion. Bilateral visualized proximal ureter is unremarkable.  Moderate stool noted in right colon and transverse colon. No pericecal inflammation. Normal appendix is noted in axial image 60.  No evidence of urinary bladder injury. Prostate gland and seminal vesicles are unremarkable.  In axial image there is y shaped acute mild compression fracture anterior aspect of upper endplate of L2 vertebral body. There is no evidence of paraspinal hematoma. The spinal canal is intact at this level. The neural foramina are patent at this level.  In axial image 41 there is significant comminuted burst compression fracture of L3 vertebral body. The fracture line is traversing the entire vertebral body. Multiple slight anterior protruding bony fragments upper endplate. There is at least 5 mm bony protrusion in spinal canal. There is moderate spinal canal stenosis. Bilateral neural foramina are intact.  There is mild comminuted compression fracture superior endplate of L4 vertebral body. There is no bony protrusion in spinal canal. Tiny anterior bony protrusion upper endplate of L4. The neuro foramina are patent.  No sacral fracture. There is mild widening of facet joint space  bilaterally at L3 L4 and L5 level best seen on sagittal images 86 and 87.  Coronal images shows bilateral hip joint symmetrical in appearance. No hip fracture is noted. Bilateral proximal femur is unremarkable.  IMPRESSION: 1. Mild comminuted compression fracture upper endplate of L2 and L4 vertebral bodies. No spinal canal stenosis or foraminal stenosis at this level. There is significant comminuted burst compression fracture of L3 vertebral body. There is about 5 mm bony protrusion in spinal canal with moderate spinal canal stenosis. No neuroforaminal stenosis at this level. Mild symmetrical widening of facet joint space at L3, L4 and L5 level. Question fragmented osteophytes right facets at L4 level. Please see sagittal image 63. 2. No acute visceral injury within abdomen or pelvis. 3. No hydronephrosis or hydroureter. Bilateral renal symmetrical excretion. 4. Normal appendix.  No pericecal inflammation. 5. No evidence of urinary bladder injury. 6. No pelvic fractures are identified.  No lower rib fractures. These results were called by telephone at the time of interpretation on 06/03/2015 at 1:06 pm to Dr. Pricilla Loveless , who verbally acknowledged these results.   Electronically Signed   By: Natasha Mead M.D.   On: 06/03/2015 13:06   Dg Pelvis Portable  06/03/2015   CLINICAL DATA:  Patient fell 30 feet from tree  EXAM: PORTABLE PELVIS 1-2 VIEWS  COMPARISON:  None.  FINDINGS: There is  no demonstrable fracture or dislocation. Joint spaces appear intact. No erosive change.  IMPRESSION: No fracture or dislocation.  No appreciable arthropathy.   Electronically Signed   By: Bretta Bang III M.D.   On: 06/03/2015 12:31   Dg Chest Portable 1 View  06/03/2015   CLINICAL DATA:  Patient fell 30 feet off tree  EXAM: PORTABLE CHEST - 1 VIEW  COMPARISON:  None.  FINDINGS: Lungs clear. Heart size and pulmonary vascularity are normal. Mediastinal contours are within normal limits. No adenopathy. No pneumothorax. No  bone lesions.  IMPRESSION: Lungs clear.  No pneumothorax apparent.   Electronically Signed   By: Bretta Bang III M.D.   On: 06/03/2015 12:29   Dg Thoracic Spine 1vclearing  06/03/2015   CLINICAL DATA:  Status post fall from a tree. Upper lumbar pain. Initial encounter.  EXAM: Lateral views of the thoracic spine.  COMPARISON:  CT abdomen and pelvis earlier this same day.  FINDINGS: Vertebral body height appears maintained throughout the thoracic spine. L2 and L3 compression fractures seen on CT scan are noted. Anterior endplate spurring is seen at T11-12.  IMPRESSION: Negative for thoracic spine fracture.  L2 and L3 compression fractures as seen on prior CT.   Electronically Signed   By: Drusilla Kanner M.D.   On: 06/03/2015 13:45   Dg Lumbar Spine 2-3vclearing  06/03/2015   CLINICAL DATA:  Status post fall out of a tree.  EXAM: LIMITED LUMBAR SPINE FOR TRAUMA CLEARING - 2-3 VIEW  COMPARISON:  None.  FINDINGS: Clearing cross-table lateral radiograph shows no definite evidence of lumbar spine fracture or subluxation. Note that this is not a complete radiographic evaluation.  There are 5 nonrib bearing lumbar-type vertebral bodies. There is a mild compression fracture of the L2 vertebral body. There is a mildly comminuted compression fracture of the L4 vertebral body with approximately 50% height loss. There is a small fracture involving the anterior superior corner of the L4 vertebral body.The alignment is anatomic. There is no spondylolysis. The disc spaces are maintained. The SI joints are unremarkable.  IMPRESSION: 1. Mild compression fracture of the L2 vertebral body. 2. Mildly comminuted compression fracture of the L4 vertebral body with approximately 50% height loss. 3. Small fracture involving the anterior superior corner of the L4 vertebral body.   Electronically Signed   By: Elige Ko   On: 06/03/2015 13:44    Anti-infectives: Anti-infectives    None      Assessment/Plan: s/p   Advance diet X-ray of right hand.  If fracture, but hand surgery involved.  LOS: 1 day   Marta Lamas. Gae Bon, MD, FACS [phone] Trauma Surgeon 06/04/2015

## 2015-06-04 NOTE — Progress Notes (Signed)
Subjective: Patient reports continued severe back pain without leg pain or numbness tingling or weakness.  Objective: Vital signs in last 24 hours: Temp:  [97.3 F (36.3 C)-100.1 F (37.8 C)] 97.3 F (36.3 C) (06/16 0753) Pulse Rate:  [77-91] 77 (06/16 0900) Resp:  [9-22] 12 (06/16 0900) BP: (116-160)/(62-105) 141/86 mmHg (06/16 0800) SpO2:  [95 %-100 %] 99 % (06/16 0900) Weight:  [245 lb (111.131 kg)] 245 lb (111.131 kg) (06/15 1136)  Intake/Output from previous day: 06/15 0730 - 06/16 0729 In: 2173.8 [P.O.:480; I.V.:1583.8; IV Piggyback:110] Out: 900 [Urine:900] Intake/Output this shift: Total I/O In: 150 [I.V.:150] Out: -   Neurologic: Grossly normal  Lab Results: Lab Results  Component Value Date   WBC 9.1 06/03/2015   HGB 14.8 06/03/2015   HCT 42.8 06/03/2015   MCV 91.1 06/03/2015   PLT 213 06/03/2015   Lab Results  Component Value Date   INR 1.03 06/03/2015   BMET Lab Results  Component Value Date   NA 140 06/03/2015   K 4.0 06/03/2015   CL 107 06/03/2015   CO2 23 06/03/2015   GLUCOSE 119* 06/03/2015   BUN 22* 06/03/2015   CREATININE 1.10 06/03/2015   CALCIUM 9.3 06/03/2015    Studies/Results: Ct Head Wo Contrast  06/03/2015   CLINICAL DATA:  Fall 30 feet. Loss of consciousness and back pain. Headache.  EXAM: CT HEAD WITHOUT CONTRAST  CT CERVICAL SPINE WITHOUT CONTRAST  TECHNIQUE: Multidetector CT imaging of the head and cervical spine was performed following the standard protocol without intravenous contrast. Multiplanar CT image reconstructions of the cervical spine were also generated.  COMPARISON:  None.  FINDINGS: CT HEAD FINDINGS  Skull and Sinuses:Negative for fracture or destructive process.  Mild scattered inflammatory mucosal thickening in the paranasal sinuses  Orbits: No acute abnormality.  Brain: No evidence of acute infarction, hemorrhage, hydrocephalus, or mass lesion/mass effect. Dilated perivascular space below the left putamen.  CT  CERVICAL SPINE FINDINGS  Negative for acute fracture or subluxation. No prevertebral edema. No gross cervical canal hematoma.  Degenerative disc narrowing and spurring focally at C5-6. There is upper cervical facet arthropathy, greatest on the right at C2-3.  IMPRESSION: No evidence of intracranial or cervical spine injury.   Electronically Signed   By: Marnee Spring M.D.   On: 06/03/2015 12:47   Ct Cervical Spine Wo Contrast  06/03/2015   CLINICAL DATA:  Fall 30 feet. Loss of consciousness and back pain. Headache.  EXAM: CT HEAD WITHOUT CONTRAST  CT CERVICAL SPINE WITHOUT CONTRAST  TECHNIQUE: Multidetector CT imaging of the head and cervical spine was performed following the standard protocol without intravenous contrast. Multiplanar CT image reconstructions of the cervical spine were also generated.  COMPARISON:  None.  FINDINGS: CT HEAD FINDINGS  Skull and Sinuses:Negative for fracture or destructive process.  Mild scattered inflammatory mucosal thickening in the paranasal sinuses  Orbits: No acute abnormality.  Brain: No evidence of acute infarction, hemorrhage, hydrocephalus, or mass lesion/mass effect. Dilated perivascular space below the left putamen.  CT CERVICAL SPINE FINDINGS  Negative for acute fracture or subluxation. No prevertebral edema. No gross cervical canal hematoma.  Degenerative disc narrowing and spurring focally at C5-6. There is upper cervical facet arthropathy, greatest on the right at C2-3.  IMPRESSION: No evidence of intracranial or cervical spine injury.   Electronically Signed   By: Marnee Spring M.D.   On: 06/03/2015 12:47   Ct Abdomen Pelvis W Contrast  06/03/2015   CLINICAL DATA:  Back pain  post 30 feet fall from ladder  EXAM: CT ABDOMEN AND PELVIS WITH CONTRAST  TECHNIQUE: Multidetector CT imaging of the abdomen and pelvis was performed using the standard protocol following bolus administration of intravenous contrast.  CONTRAST:  OMNIPAQUE IOHEXOL 300 MG/ML  SOLN   COMPARISON:  None.  FINDINGS: Lung bases are unremarkable.  No lower rib fractures are noted.  Enhanced liver is unremarkable appearance of liver laceration. No calcified gallstones are noted within gallbladder. Atherosclerotic calcifications of abdominal aorta and common iliac arteries. No aortic aneurysm. No evidence of aortic injury.  Enhanced pancreas, spleen and adrenal glands are unremarkable. Enhanced kidneys are symmetrical in size. No hydronephrosis or hydroureter.  No thickened or dilated small bowel loops. No evidence of renal laceration. Delayed renal images shows bilateral renal symmetrical excretion. Bilateral visualized proximal ureter is unremarkable.  Moderate stool noted in right colon and transverse colon. No pericecal inflammation. Normal appendix is noted in axial image 60.  No evidence of urinary bladder injury. Prostate gland and seminal vesicles are unremarkable.  In axial image there is y shaped acute mild compression fracture anterior aspect of upper endplate of L2 vertebral body. There is no evidence of paraspinal hematoma. The spinal canal is intact at this level. The neural foramina are patent at this level.  In axial image 41 there is significant comminuted burst compression fracture of L3 vertebral body. The fracture line is traversing the entire vertebral body. Multiple slight anterior protruding bony fragments upper endplate. There is at least 5 mm bony protrusion in spinal canal. There is moderate spinal canal stenosis. Bilateral neural foramina are intact.  There is mild comminuted compression fracture superior endplate of L4 vertebral body. There is no bony protrusion in spinal canal. Tiny anterior bony protrusion upper endplate of L4. The neuro foramina are patent.  No sacral fracture. There is mild widening of facet joint space bilaterally at L3 L4 and L5 level best seen on sagittal images 86 and 87.  Coronal images shows bilateral hip joint symmetrical in appearance. No hip  fracture is noted. Bilateral proximal femur is unremarkable.  IMPRESSION: 1. Mild comminuted compression fracture upper endplate of L2 and L4 vertebral bodies. No spinal canal stenosis or foraminal stenosis at this level. There is significant comminuted burst compression fracture of L3 vertebral body. There is about 5 mm bony protrusion in spinal canal with moderate spinal canal stenosis. No neuroforaminal stenosis at this level. Mild symmetrical widening of facet joint space at L3, L4 and L5 level. Question fragmented osteophytes right facets at L4 level. Please see sagittal image 63. 2. No acute visceral injury within abdomen or pelvis. 3. No hydronephrosis or hydroureter. Bilateral renal symmetrical excretion. 4. Normal appendix.  No pericecal inflammation. 5. No evidence of urinary bladder injury. 6. No pelvic fractures are identified.  No lower rib fractures. These results were called by telephone at the time of interpretation on 06/03/2015 at 1:06 pm to Dr. Pricilla Loveless , who verbally acknowledged these results.   Electronically Signed   By: Natasha Mead M.D.   On: 06/03/2015 13:06   Dg Pelvis Portable  06/03/2015   CLINICAL DATA:  Patient fell 30 feet from tree  EXAM: PORTABLE PELVIS 1-2 VIEWS  COMPARISON:  None.  FINDINGS: There is no demonstrable fracture or dislocation. Joint spaces appear intact. No erosive change.  IMPRESSION: No fracture or dislocation.  No appreciable arthropathy.   Electronically Signed   By: Bretta Bang III M.D.   On: 06/03/2015 12:31  Dg Hand 2 View Right  06/04/2015   CLINICAL DATA:  Injury  EXAM: RIGHT HAND - 2 VIEW  COMPARISON:  None.  FINDINGS: There is no oblique view. This limits the examination. There is an acute intra-articular fracture at the base of the distal phalanx of the thumb. Degenerative changes in the IP joints are present.  IMPRESSION: Intra-articular fracture involving the distal phalanx of the thumb.   Electronically Signed   By: Jolaine Click M.D.    On: 06/04/2015 09:51   Dg Chest Portable 1 View  06/03/2015   CLINICAL DATA:  Patient fell 30 feet off tree  EXAM: PORTABLE CHEST - 1 VIEW  COMPARISON:  None.  FINDINGS: Lungs clear. Heart size and pulmonary vascularity are normal. Mediastinal contours are within normal limits. No adenopathy. No pneumothorax. No bone lesions.  IMPRESSION: Lungs clear.  No pneumothorax apparent.   Electronically Signed   By: Bretta Bang III M.D.   On: 06/03/2015 12:29   Dg Thoracic Spine 1vclearing  06/03/2015   CLINICAL DATA:  Status post fall from a tree. Upper lumbar pain. Initial encounter.  EXAM: Lateral views of the thoracic spine.  COMPARISON:  CT abdomen and pelvis earlier this same day.  FINDINGS: Vertebral body height appears maintained throughout the thoracic spine. L2 and L3 compression fractures seen on CT scan are noted. Anterior endplate spurring is seen at T11-12.  IMPRESSION: Negative for thoracic spine fracture.  L2 and L3 compression fractures as seen on prior CT.   Electronically Signed   By: Drusilla Kanner M.D.   On: 06/03/2015 13:45   Dg Lumbar Spine 2-3vclearing  06/03/2015   CLINICAL DATA:  Status post fall out of a tree.  EXAM: LIMITED LUMBAR SPINE FOR TRAUMA CLEARING - 2-3 VIEW  COMPARISON:  None.  FINDINGS: Clearing cross-table lateral radiograph shows no definite evidence of lumbar spine fracture or subluxation. Note that this is not a complete radiographic evaluation.  There are 5 nonrib bearing lumbar-type vertebral bodies. There is a mild compression fracture of the L2 vertebral body. There is a mildly comminuted compression fracture of the L4 vertebral body with approximately 50% height loss. There is a small fracture involving the anterior superior corner of the L4 vertebral body.The alignment is anatomic. There is no spondylolysis. The disc spaces are maintained. The SI joints are unremarkable.  IMPRESSION: 1. Mild compression fracture of the L2 vertebral body. 2. Mildly comminuted  compression fracture of the L4 vertebral body with approximately 50% height loss. 3. Small fracture involving the anterior superior corner of the L4 vertebral body.   Electronically Signed   By: Elige Ko   On: 06/03/2015 13:44    Assessment/Plan: He is stable. We'll continue pain control for now. Try to mobilize and TLSO brace.   LOS: 1 day    Bobby Horn S 06/04/2015, 10:10 AM

## 2015-06-05 ENCOUNTER — Inpatient Hospital Stay (HOSPITAL_COMMUNITY): Payer: Self-pay

## 2015-06-05 MED ORDER — OXYCODONE HCL 5 MG PO TABS
10.0000 mg | ORAL_TABLET | ORAL | Status: DC | PRN
  Administered 2015-06-05 – 2015-06-10 (×25): 10 mg via ORAL
  Filled 2015-06-05 (×24): qty 2

## 2015-06-05 MED ORDER — MAGNESIUM HYDROXIDE 400 MG/5ML PO SUSP
30.0000 mL | Freq: Every day | ORAL | Status: DC | PRN
  Administered 2015-06-05 – 2015-06-09 (×4): 30 mL via ORAL
  Filled 2015-06-05 (×4): qty 30

## 2015-06-05 MED ORDER — PANTOPRAZOLE SODIUM 40 MG PO TBEC
40.0000 mg | DELAYED_RELEASE_TABLET | Freq: Every day | ORAL | Status: DC
  Administered 2015-06-05 – 2015-06-15 (×10): 40 mg via ORAL
  Filled 2015-06-05 (×10): qty 1

## 2015-06-05 NOTE — Progress Notes (Signed)
Subjective: Patient reports continued significant back pain. His right thumb is fractured and is now in a splint. He denies numbness tingling weakness or pain in the legs. He has been out of bed in his TLSO to the bathroom.  Objective: Vital signs in last 24 hours: Temp:  [97.3 F (36.3 C)-98.4 F (36.9 C)] 98.2 F (36.8 C) (06/17 0000) Pulse Rate:  [64-84] 77 (06/17 0700) Resp:  [8-23] 11 (06/17 0700) BP: (121-156)/(76-116) 144/91 mmHg (06/17 0700) SpO2:  [94 %-100 %] 99 % (06/17 0700)  Intake/Output from previous day: 06/16 0730 - 06/17 0729 In: 2600 [P.O.:800; I.V.:1800] Out: 500 [Urine:500] Intake/Output this shift:    Neurologic: Grossly normal  Lab Results: Lab Results  Component Value Date   WBC 9.1 06/03/2015   HGB 14.8 06/03/2015   HCT 42.8 06/03/2015   MCV 91.1 06/03/2015   PLT 213 06/03/2015   Lab Results  Component Value Date   INR 1.03 06/03/2015   BMET Lab Results  Component Value Date   NA 140 06/03/2015   K 4.0 06/03/2015   CL 107 06/03/2015   CO2 23 06/03/2015   GLUCOSE 119* 06/03/2015   BUN 22* 06/03/2015   CREATININE 1.10 06/03/2015   CALCIUM 9.3 06/03/2015    Studies/Results: Ct Head Wo Contrast  06/03/2015   CLINICAL DATA:  Fall 30 feet. Loss of consciousness and back pain. Headache.  EXAM: CT HEAD WITHOUT CONTRAST  CT CERVICAL SPINE WITHOUT CONTRAST  TECHNIQUE: Multidetector CT imaging of the head and cervical spine was performed following the standard protocol without intravenous contrast. Multiplanar CT image reconstructions of the cervical spine were also generated.  COMPARISON:  None.  FINDINGS: CT HEAD FINDINGS  Skull and Sinuses:Negative for fracture or destructive process.  Mild scattered inflammatory mucosal thickening in the paranasal sinuses  Orbits: No acute abnormality.  Brain: No evidence of acute infarction, hemorrhage, hydrocephalus, or mass lesion/mass effect. Dilated perivascular space below the left putamen.  CT CERVICAL SPINE  FINDINGS  Negative for acute fracture or subluxation. No prevertebral edema. No gross cervical canal hematoma.  Degenerative disc narrowing and spurring focally at C5-6. There is upper cervical facet arthropathy, greatest on the right at C2-3.  IMPRESSION: No evidence of intracranial or cervical spine injury.   Electronically Signed   By: Marnee Spring M.D.   On: 06/03/2015 12:47   Ct Cervical Spine Wo Contrast  06/03/2015   CLINICAL DATA:  Fall 30 feet. Loss of consciousness and back pain. Headache.  EXAM: CT HEAD WITHOUT CONTRAST  CT CERVICAL SPINE WITHOUT CONTRAST  TECHNIQUE: Multidetector CT imaging of the head and cervical spine was performed following the standard protocol without intravenous contrast. Multiplanar CT image reconstructions of the cervical spine were also generated.  COMPARISON:  None.  FINDINGS: CT HEAD FINDINGS  Skull and Sinuses:Negative for fracture or destructive process.  Mild scattered inflammatory mucosal thickening in the paranasal sinuses  Orbits: No acute abnormality.  Brain: No evidence of acute infarction, hemorrhage, hydrocephalus, or mass lesion/mass effect. Dilated perivascular space below the left putamen.  CT CERVICAL SPINE FINDINGS  Negative for acute fracture or subluxation. No prevertebral edema. No gross cervical canal hematoma.  Degenerative disc narrowing and spurring focally at C5-6. There is upper cervical facet arthropathy, greatest on the right at C2-3.  IMPRESSION: No evidence of intracranial or cervical spine injury.   Electronically Signed   By: Marnee Spring M.D.   On: 06/03/2015 12:47   Ct Abdomen Pelvis W Contrast  06/03/2015  CLINICAL DATA:  Back pain post 30 feet fall from ladder  EXAM: CT ABDOMEN AND PELVIS WITH CONTRAST  TECHNIQUE: Multidetector CT imaging of the abdomen and pelvis was performed using the standard protocol following bolus administration of intravenous contrast.  CONTRAST:  OMNIPAQUE IOHEXOL 300 MG/ML  SOLN  COMPARISON:   None.  FINDINGS: Lung bases are unremarkable.  No lower rib fractures are noted.  Enhanced liver is unremarkable appearance of liver laceration. No calcified gallstones are noted within gallbladder. Atherosclerotic calcifications of abdominal aorta and common iliac arteries. No aortic aneurysm. No evidence of aortic injury.  Enhanced pancreas, spleen and adrenal glands are unremarkable. Enhanced kidneys are symmetrical in size. No hydronephrosis or hydroureter.  No thickened or dilated small bowel loops. No evidence of renal laceration. Delayed renal images shows bilateral renal symmetrical excretion. Bilateral visualized proximal ureter is unremarkable.  Moderate stool noted in right colon and transverse colon. No pericecal inflammation. Normal appendix is noted in axial image 60.  No evidence of urinary bladder injury. Prostate gland and seminal vesicles are unremarkable.  In axial image there is y shaped acute mild compression fracture anterior aspect of upper endplate of L2 vertebral body. There is no evidence of paraspinal hematoma. The spinal canal is intact at this level. The neural foramina are patent at this level.  In axial image 41 there is significant comminuted burst compression fracture of L3 vertebral body. The fracture line is traversing the entire vertebral body. Multiple slight anterior protruding bony fragments upper endplate. There is at least 5 mm bony protrusion in spinal canal. There is moderate spinal canal stenosis. Bilateral neural foramina are intact.  There is mild comminuted compression fracture superior endplate of L4 vertebral body. There is no bony protrusion in spinal canal. Tiny anterior bony protrusion upper endplate of L4. The neuro foramina are patent.  No sacral fracture. There is mild widening of facet joint space bilaterally at L3 L4 and L5 level best seen on sagittal images 86 and 87.  Coronal images shows bilateral hip joint symmetrical in appearance. No hip fracture is  noted. Bilateral proximal femur is unremarkable.  IMPRESSION: 1. Mild comminuted compression fracture upper endplate of L2 and L4 vertebral bodies. No spinal canal stenosis or foraminal stenosis at this level. There is significant comminuted burst compression fracture of L3 vertebral body. There is about 5 mm bony protrusion in spinal canal with moderate spinal canal stenosis. No neuroforaminal stenosis at this level. Mild symmetrical widening of facet joint space at L3, L4 and L5 level. Question fragmented osteophytes right facets at L4 level. Please see sagittal image 63. 2. No acute visceral injury within abdomen or pelvis. 3. No hydronephrosis or hydroureter. Bilateral renal symmetrical excretion. 4. Normal appendix.  No pericecal inflammation. 5. No evidence of urinary bladder injury. 6. No pelvic fractures are identified.  No lower rib fractures. These results were called by telephone at the time of interpretation on 06/03/2015 at 1:06 pm to Dr. Pricilla Loveless , who verbally acknowledged these results.   Electronically Signed   By: Natasha Mead M.D.   On: 06/03/2015 13:06   Dg Pelvis Portable  06/03/2015   CLINICAL DATA:  Patient fell 30 feet from tree  EXAM: PORTABLE PELVIS 1-2 VIEWS  COMPARISON:  None.  FINDINGS: There is no demonstrable fracture or dislocation. Joint spaces appear intact. No erosive change.  IMPRESSION: No fracture or dislocation.  No appreciable arthropathy.   Electronically Signed   By: Bretta Bang III M.D.  On: 06/03/2015 12:31   Dg Hand 2 View Right  06/04/2015   CLINICAL DATA:  Injury  EXAM: RIGHT HAND - 2 VIEW  COMPARISON:  None.  FINDINGS: There is no oblique view. This limits the examination. There is an acute intra-articular fracture at the base of the distal phalanx of the thumb. Degenerative changes in the IP joints are present.  IMPRESSION: Intra-articular fracture involving the distal phalanx of the thumb.   Electronically Signed   By: Jolaine Click M.D.   On:  06/04/2015 09:51   Dg Chest Portable 1 View  06/03/2015   CLINICAL DATA:  Patient fell 30 feet off tree  EXAM: PORTABLE CHEST - 1 VIEW  COMPARISON:  None.  FINDINGS: Lungs clear. Heart size and pulmonary vascularity are normal. Mediastinal contours are within normal limits. No adenopathy. No pneumothorax. No bone lesions.  IMPRESSION: Lungs clear.  No pneumothorax apparent.   Electronically Signed   By: Bretta Bang III M.D.   On: 06/03/2015 12:29   Dg Thoracic Spine 1vclearing  06/03/2015   CLINICAL DATA:  Status post fall from a tree. Upper lumbar pain. Initial encounter.  EXAM: Lateral views of the thoracic spine.  COMPARISON:  CT abdomen and pelvis earlier this same day.  FINDINGS: Vertebral body height appears maintained throughout the thoracic spine. L2 and L3 compression fractures seen on CT scan are noted. Anterior endplate spurring is seen at T11-12.  IMPRESSION: Negative for thoracic spine fracture.  L2 and L3 compression fractures as seen on prior CT.   Electronically Signed   By: Drusilla Kanner M.D.   On: 06/03/2015 13:45   Dg Lumbar Spine 2-3vclearing  06/03/2015   CLINICAL DATA:  Status post fall out of a tree.  EXAM: LIMITED LUMBAR SPINE FOR TRAUMA CLEARING - 2-3 VIEW  COMPARISON:  None.  FINDINGS: Clearing cross-table lateral radiograph shows no definite evidence of lumbar spine fracture or subluxation. Note that this is not a complete radiographic evaluation.  There are 5 nonrib bearing lumbar-type vertebral bodies. There is a mild compression fracture of the L2 vertebral body. There is a mildly comminuted compression fracture of the L4 vertebral body with approximately 50% height loss. There is a small fracture involving the anterior superior corner of the L4 vertebral body.The alignment is anatomic. There is no spondylolysis. The disc spaces are maintained. The SI joints are unremarkable.  IMPRESSION: 1. Mild compression fracture of the L2 vertebral body. 2. Mildly comminuted  compression fracture of the L4 vertebral body with approximately 50% height loss. 3. Small fracture involving the anterior superior corner of the L4 vertebral body.   Electronically Signed   By: Elige Ko   On: 06/03/2015 13:44    Assessment/Plan: Stable. Continued to mobilize and brace. I'm still concerned he may need surgical stabilization.   LOS: 2 days    Bobby Horn,Bobby Horn 06/05/2015, 7:39 AM

## 2015-06-05 NOTE — Discharge Instructions (Signed)
Keep right thumb splint on at all times do not remove. See Dr. Ophelia Charter next week call for appt.   [phone] .

## 2015-06-05 NOTE — Progress Notes (Signed)
Patient ID: Bobby Horn, male   DOB: 1966-08-15, 49 y.o.   MRN: [id number]    Subjective: C/O back pain, passing gas  Objective: Vital signs in last 24 hours: Temp:  [97.8 F (36.6 C)-98.6 F (37 C)] 98.6 F (37 C) (06/17 0816) Pulse Rate:  [64-84] 77 (06/17 0700) Resp:  [8-23] 11 (06/17 0700) BP: (121-156)/(76-116) 144/91 mmHg (06/17 0700) SpO2:  [94 %-100 %] 99 % (06/17 0700)    Intake/Output from previous day: 06/16 0701 - 06/17 0700 In: 2600 [P.O.:800; I.V.:1800] Out: 500 [Urine:500] Intake/Output this shift:    General appearance: alert and cooperative GI: soft, mild distention, +BS Extremities: splint R thumb  Lab Results: CBC   Recent Labs  06/03/15 1150 06/03/15 1151  WBC  --  9.1  HGB 15.0 14.8  HCT 44.0 42.8  PLT  --  213   BMET  Recent Labs  06/03/15 1150 06/03/15 1151  NA 141 140  K 3.9 4.0  CL 105 107  CO2  --  23  GLUCOSE 115* 119*  BUN 26* 22*  CREATININE 1.20 1.10  CALCIUM  --  9.3   PT/INR  Recent Labs  06/03/15 1151  LABPROT 13.7  INR 1.03   Anti-infectives: Anti-infectives    None      Assessment/Plan: Fall L2-4FXs - per Dr. Yetta Barre, TLSO, PT/OT. I showed him his FXs on the CT per his request. R thumb DP FX - splint per Dr. Ophelia Charter Trauma will sign off.   LOS: 2 days    Violeta Gelinas, MD, MPH, FACS Trauma: [phone] General Surgery: [phone]  06/05/2015

## 2015-06-05 NOTE — Consult Note (Signed)
Reason for Consult: fall 20 ft from ladder  while doing tree trimming with right thumb fracture Referring Physician:   Trauma service  Bobby Horn is an 49 y.o. male.  HPI: 49 yo male was on a ladder trimming a tree which is his Vocation and when limb fell ladder dislodged and he states he fell about 20 ft and landed on his back with L2 Fx and also right thumb fx. Closed. Thumb swollen , painful with motion, was splinted by Ortho Tech as instructed. Fell yesterday, no past Hx of right thumb injury. Previous right small finger Mallet deformity. Takes Norco and also Xanax as home meds.   Past Medical History  Diagnosis Date  . Atrial fibrillation   . Gout   . Arthritis     History reviewed. No pertinent past surgical history.  No family history on file.  Social History:  reports that he has been smoking.  He does not have any smokeless tobacco history on file. He reports that he drinks alcohol. His drug history is not on file.  Allergies: No Known Allergies  Medications: I have reviewed the patient's current medications.  Results for orders placed or performed during the hospital encounter of 06/03/15 (from the past 48 hour(s))  Sample to Blood Bank     Status: None   Collection Time: 06/03/15 11:40 AM  Result Value Ref Range   Blood Bank Specimen SAMPLE AVAILABLE FOR TESTING    Sample Expiration 06/04/2015   I-Stat Chem 8, ED  (not at [facility], [facility])     Status: Abnormal   Collection Time: 06/03/15 11:50 AM  Result Value Ref Range   Sodium 141 135 - 145 mmol/L   Potassium 3.9 3.5 - 5.1 mmol/L   Chloride 105 101 - 111 mmol/L   BUN 26 (H) 6 - 20 mg/dL   Creatinine, Ser 1.20 0.61 - 1.24 mg/dL   Glucose, Bld 115 (H) 65 - 99 mg/dL   Calcium, Ion 1.15 1.12 - 1.23 mmol/L   TCO2 22 0 - 100 mmol/L   Hemoglobin 15.0 13.0 - 17.0 g/dL   HCT 44.0 39.0 - 52.0 %  CDS serology     Status: None   Collection Time: 06/03/15 11:51 AM  Result Value Ref Range   CDS serology specimen STAT    Comprehensive metabolic panel     Status: Abnormal   Collection Time: 06/03/15 11:51 AM  Result Value Ref Range   Sodium 140 135 - 145 mmol/L   Potassium 4.0 3.5 - 5.1 mmol/L   Chloride 107 101 - 111 mmol/L   CO2 23 22 - 32 mmol/L   Glucose, Bld 119 (H) 65 - 99 mg/dL   BUN 22 (H) 6 - 20 mg/dL   Creatinine, Ser 1.10 0.61 - 1.24 mg/dL   Calcium 9.3 8.9 - 10.3 mg/dL   Total Protein 6.9 6.5 - 8.1 g/dL   Albumin 4.1 3.5 - 5.0 g/dL   AST 32 15 - 41 U/L   ALT 25 17 - 63 U/L   Alkaline Phosphatase 61 38 - 126 U/L   Total Bilirubin 0.5 0.3 - 1.2 mg/dL   GFR calc non Af Amer >60 >60 mL/min   GFR calc Af Amer >60 >60 mL/min    Comment: (NOTE) The eGFR has been calculated using the CKD EPI equation. This calculation has not been validated in all clinical situations. eGFR's persistently <60 mL/min signify possible Chronic Kidney Disease.    Anion gap 10 5 - 15  CBC  Status: None   Collection Time: 06/03/15 11:51 AM  Result Value Ref Range   WBC 9.1 4.0 - 10.5 K/uL   RBC 4.70 4.22 - 5.81 MIL/uL   Hemoglobin 14.8 13.0 - 17.0 g/dL   HCT 42.8 39.0 - 52.0 %   MCV 91.1 78.0 - 100.0 fL   MCH 31.5 26.0 - 34.0 pg   MCHC 34.6 30.0 - 36.0 g/dL   RDW 12.6 11.5 - 15.5 %   Platelets 213 150 - 400 K/uL  Protime-INR     Status: None   Collection Time: 06/03/15 11:51 AM  Result Value Ref Range   Prothrombin Time 13.7 11.6 - 15.2 seconds   INR 1.03 0.00 - 1.49  Ethanol     Status: None   Collection Time: 06/03/15 11:53 AM  Result Value Ref Range   Alcohol, Ethyl (B) <5 <5 mg/dL    Comment:        LOWEST DETECTABLE LIMIT FOR SERUM ALCOHOL IS 5 mg/dL FOR MEDICAL PURPOSES ONLY   MRSA PCR Screening     Status: None   Collection Time: 06/03/15  4:38 PM  Result Value Ref Range   MRSA by PCR NEGATIVE NEGATIVE    Comment:        The GeneXpert MRSA Assay (FDA approved for NASAL specimens only), is one component of a comprehensive MRSA colonization surveillance program. It is not intended  to diagnose MRSA infection nor to guide or monitor treatment for MRSA infections.     Ct Head Wo Contrast  06/03/2015   CLINICAL DATA:  Fall 30 feet. Loss of consciousness and back pain. Headache.  EXAM: CT HEAD WITHOUT CONTRAST  CT CERVICAL SPINE WITHOUT CONTRAST  TECHNIQUE: Multidetector CT imaging of the head and cervical spine was performed following the standard protocol without intravenous contrast. Multiplanar CT image reconstructions of the cervical spine were also generated.  COMPARISON:  None.  FINDINGS: CT HEAD FINDINGS  Skull and Sinuses:Negative for fracture or destructive process.  Mild scattered inflammatory mucosal thickening in the paranasal sinuses  Orbits: No acute abnormality.  Brain: No evidence of acute infarction, hemorrhage, hydrocephalus, or mass lesion/mass effect. Dilated perivascular space below the left putamen.  CT CERVICAL SPINE FINDINGS  Negative for acute fracture or subluxation. No prevertebral edema. No gross cervical canal hematoma.  Degenerative disc narrowing and spurring focally at C5-6. There is upper cervical facet arthropathy, greatest on the right at C2-3.  IMPRESSION: No evidence of intracranial or cervical spine injury.   Electronically Signed   By: Monte Fantasia M.D.   On: 06/03/2015 12:47   Ct Cervical Spine Wo Contrast  06/03/2015   CLINICAL DATA:  Fall 30 feet. Loss of consciousness and back pain. Headache.  EXAM: CT HEAD WITHOUT CONTRAST  CT CERVICAL SPINE WITHOUT CONTRAST  TECHNIQUE: Multidetector CT imaging of the head and cervical spine was performed following the standard protocol without intravenous contrast. Multiplanar CT image reconstructions of the cervical spine were also generated.  COMPARISON:  None.  FINDINGS: CT HEAD FINDINGS  Skull and Sinuses:Negative for fracture or destructive process.  Mild scattered inflammatory mucosal thickening in the paranasal sinuses  Orbits: No acute abnormality.  Brain: No evidence of acute infarction,  hemorrhage, hydrocephalus, or mass lesion/mass effect. Dilated perivascular space below the left putamen.  CT CERVICAL SPINE FINDINGS  Negative for acute fracture or subluxation. No prevertebral edema. No gross cervical canal hematoma.  Degenerative disc narrowing and spurring focally at C5-6. There is upper cervical facet arthropathy, greatest on  the right at C2-3.  IMPRESSION: No evidence of intracranial or cervical spine injury.   Electronically Signed   By: Monte Fantasia M.D.   On: 06/03/2015 12:47   Ct Abdomen Pelvis W Contrast  06/03/2015   CLINICAL DATA:  Back pain post 30 feet fall from ladder  EXAM: CT ABDOMEN AND PELVIS WITH CONTRAST  TECHNIQUE: Multidetector CT imaging of the abdomen and pelvis was performed using the standard protocol following bolus administration of intravenous contrast.  CONTRAST:  131m OMNIPAQUE IOHEXOL 300 MG/ML  SOLN  COMPARISON:  None.  FINDINGS: Lung bases are unremarkable.  No lower rib fractures are noted.  Enhanced liver is unremarkable appearance of liver laceration. No calcified gallstones are noted within gallbladder. Atherosclerotic calcifications of abdominal aorta and common iliac arteries. No aortic aneurysm. No evidence of aortic injury.  Enhanced pancreas, spleen and adrenal glands are unremarkable. Enhanced kidneys are symmetrical in size. No hydronephrosis or hydroureter.  No thickened or dilated small bowel loops. No evidence of renal laceration. Delayed renal images shows bilateral renal symmetrical excretion. Bilateral visualized proximal ureter is unremarkable.  Moderate stool noted in right colon and transverse colon. No pericecal inflammation. Normal appendix is noted in axial image 60.  No evidence of urinary bladder injury. Prostate gland and seminal vesicles are unremarkable.  In axial image there is y shaped acute mild compression fracture anterior aspect of upper endplate of L2 vertebral body. There is no evidence of paraspinal hematoma. The spinal  canal is intact at this level. The neural foramina are patent at this level.  In axial image 41 there is significant comminuted burst compression fracture of L3 vertebral body. The fracture line is traversing the entire vertebral body. Multiple slight anterior protruding bony fragments upper endplate. There is at least 5 mm bony protrusion in spinal canal. There is moderate spinal canal stenosis. Bilateral neural foramina are intact.  There is mild comminuted compression fracture superior endplate of L4 vertebral body. There is no bony protrusion in spinal canal. Tiny anterior bony protrusion upper endplate of L4. The neuro foramina are patent.  No sacral fracture. There is mild widening of facet joint space bilaterally at L3 L4 and L5 level best seen on sagittal images 86 and 87.  Coronal images shows bilateral hip joint symmetrical in appearance. No hip fracture is noted. Bilateral proximal femur is unremarkable.  IMPRESSION: 1. Mild comminuted compression fracture upper endplate of L2 and L4 vertebral bodies. No spinal canal stenosis or foraminal stenosis at this level. There is significant comminuted burst compression fracture of L3 vertebral body. There is about 5 mm bony protrusion in spinal canal with moderate spinal canal stenosis. No neuroforaminal stenosis at this level. Mild symmetrical widening of facet joint space at L3, L4 and L5 level. Question fragmented osteophytes right facets at L4 level. Please see sagittal image 63. 2. No acute visceral injury within abdomen or pelvis. 3. No hydronephrosis or hydroureter. Bilateral renal symmetrical excretion. 4. Normal appendix.  No pericecal inflammation. 5. No evidence of urinary bladder injury. 6. No pelvic fractures are identified.  No lower rib fractures. These results were called by telephone at the time of interpretation on 06/03/2015 at 1:06 pm to Dr. SSherwood Gambler, who verbally acknowledged these results.   Electronically Signed   By: LLahoma CrockerM.D.    On: 06/03/2015 13:06   Dg Pelvis Portable  06/03/2015   CLINICAL DATA:  Patient fell 30 feet from tree  EXAM: PORTABLE PELVIS 1-2 VIEWS  COMPARISON:  None.  FINDINGS: There is no demonstrable fracture or dislocation. Joint spaces appear intact. No erosive change.  IMPRESSION: No fracture or dislocation.  No appreciable arthropathy.   Electronically Signed   By: Lowella Grip III M.D.   On: 06/03/2015 12:31   Dg Hand 2 View Right  06/04/2015   CLINICAL DATA:  Injury  EXAM: RIGHT HAND - 2 VIEW  COMPARISON:  None.  FINDINGS: There is no oblique view. This limits the examination. There is an acute intra-articular fracture at the base of the distal phalanx of the thumb. Degenerative changes in the IP joints are present.  IMPRESSION: Intra-articular fracture involving the distal phalanx of the thumb.   Electronically Signed   By: Marybelle Killings M.D.   On: 06/04/2015 09:51   Dg Chest Portable 1 View  06/03/2015   CLINICAL DATA:  Patient fell 30 feet off tree  EXAM: PORTABLE CHEST - 1 VIEW  COMPARISON:  None.  FINDINGS: Lungs clear. Heart size and pulmonary vascularity are normal. Mediastinal contours are within normal limits. No adenopathy. No pneumothorax. No bone lesions.  IMPRESSION: Lungs clear.  No pneumothorax apparent.   Electronically Signed   By: Lowella Grip III M.D.   On: 06/03/2015 12:29   Dg Thoracic Spine 1vclearing  06/03/2015   CLINICAL DATA:  Status post fall from a tree. Upper lumbar pain. Initial encounter.  EXAM: Lateral views of the thoracic spine.  COMPARISON:  CT abdomen and pelvis earlier this same day.  FINDINGS: Vertebral body height appears maintained throughout the thoracic spine. L2 and L3 compression fractures seen on CT scan are noted. Anterior endplate spurring is seen at T11-12.  IMPRESSION: Negative for thoracic spine fracture.  L2 and L3 compression fractures as seen on prior CT.   Electronically Signed   By: Inge Rise M.D.   On: 06/03/2015 13:45   Dg Lumbar  Spine 2-3vclearing  06/03/2015   CLINICAL DATA:  Status post fall out of a tree.  EXAM: LIMITED LUMBAR SPINE FOR TRAUMA CLEARING - 2-3 VIEW  COMPARISON:  None.  FINDINGS: Clearing cross-table lateral radiograph shows no definite evidence of lumbar spine fracture or subluxation. Note that this is not a complete radiographic evaluation.  There are 5 nonrib bearing lumbar-type vertebral bodies. There is a mild compression fracture of the L2 vertebral body. There is a mildly comminuted compression fracture of the L4 vertebral body with approximately 50% height loss. There is a small fracture involving the anterior superior corner of the L4 vertebral body.The alignment is anatomic. There is no spondylolysis. The disc spaces are maintained. The SI joints are unremarkable.  IMPRESSION: 1. Mild compression fracture of the L2 vertebral body. 2. Mildly comminuted compression fracture of the L4 vertebral body with approximately 50% height loss. 3. Small fracture involving the anterior superior corner of the L4 vertebral body.   Electronically Signed   By: Kathreen Devoid   On: 06/03/2015 13:44    Review of Systems  Constitutional: Negative for fever, chills and malaise/fatigue.  Eyes: Negative for double vision.  Respiratory: Negative for cough.   Cardiovascular:       Atrial fib.   Gastrointestinal: Negative for nausea.  Musculoskeletal: Positive for back pain.  Skin: Negative for rash.  Neurological: Negative for headaches.  Psychiatric/Behavioral: The patient is nervous/anxious.    Blood pressure 147/102, pulse 76, temperature 98.6 F (37 C), temperature source Oral, resp. rate 12, height 6' 1"  (1.854 m), weight 111.131 kg (245 lb), SpO2 98 %. Physical Exam  Constitutional: He is oriented to person, place, and time. He appears well-developed and well-nourished.  HENT:  Head: Normocephalic.  Eyes: Pupils are equal, round, and reactive to light.  Neck: Normal range of motion.  Cardiovascular: Normal rate  and regular rhythm.   Respiratory: Effort normal.  GI: Soft.  Musculoskeletal:  In TLSO . Right thumb IP joint hemarthrosis . Edema. No eccymosis. Sensation intact.  Ext lag.   Neurological: He is alert and oriented to person, place, and time.  Skin: Skin is warm.  Psychiatric: He has a normal mood and affect. His behavior is normal.    Assessment/Plan:  right thumb distal phalanx fracture.  ( mallet thumb).   spint removed , molded and reapplied. He will not need surgery but must keep splint on and Stack Splint will be applied next week in my office.  Thanks  [phone].   Follow-up info placed.   Thanks  [phone]  YATES,MARK C 06/05/2015, 10:16 AM

## 2015-06-05 NOTE — Progress Notes (Signed)
PT Cancellation Note  Patient Details Name: Bobby Horn MRN: [id number] DOB: October 27, 1966   Cancelled Treatment:    Reason Eval/Treat Not Completed: Patient at procedure or test/unavailable Patient just taken off unit for x-ray. Will follow up as schedule allows for comprehensive PT evaluation.  Berton Mount 06/05/2015, 1:51 PM Charlsie Merles, [place] [phone]

## 2015-06-05 NOTE — Evaluation (Signed)
Physical Therapy Evaluation Patient Details Name: Bobby Horn MRN: [id number] DOB: October 10, 1966 Today's Date: 06/05/2015   History of Present Illness  49 y.o. male with fall from ladder sustained L2-4FXs and Rt thumb distal phalanx Fx.  Clinical Impression  Pt admitted with the above injuries after sustaining a fall from a ladder.  Bobby Horn tolerated bed mobility and education for TLSO use. He did ambulate up to 65 feet today using a rolling walker for support. Demonstrates some instability with very narrow base of support and shuffling of feet. Seems to be in quite a bit of pain despite coordinating pain medications with nursing. He also complains of anterior pelvic pain, made worse by standing. Very motivated to improve and has strong family support. Pt currently with functional limitations due to the deficits listed below (see PT Problem List). Hopeful that patient will continue to progress well. Will monitor closely for changes in function and update discharge recommendations appropriately. Pt will benefit from skilled PT to increase their independence and safety with mobility to allow discharge to the venue listed below.       Follow Up Recommendations  (TBD based on progression)    Equipment Recommendations  Rolling walker with 5" wheels    Recommendations for Other Services OT consult     Precautions / Restrictions Precautions Precautions: Back;Fall Precaution Booklet Issued: Yes (comment) Precaution Comments: reviewed with pt and wife Required Braces or Orthoses: Spinal Brace Spinal Brace: Thoracolumbosacral orthotic;Applied in supine position Restrictions Weight Bearing Restrictions: No      Mobility  Bed Mobility Overal bed mobility: Needs Assistance Bed Mobility: Rolling;Sidelying to Sit Rolling: Min guard Sidelying to sit: Min assist       General bed mobility comments: Educated on log roll technique while teaching wife how to donne TLSO in bed. VC for techniques  throughout min guard to roll. Min assist to rise from sidelying position with help for truncal support.  Transfers Overall transfer level: Needs assistance Equipment used: Rolling walker (2 wheeled) Transfers: Sit to/from Stand Sit to Stand: Min guard         General transfer comment: Min guard for safety. VC for hand placement. LEs mildly unstable while rising, Relies heavily on UEs. Cues to maintain back precautions  Ambulation/Gait Ambulation/Gait assistance: Min assist Ambulation Distance (Feet): 65 Feet Assistive device: Rolling walker (2 wheeled) Gait Pattern/deviations: Step-through pattern;Decreased stride length;Shuffle;Narrow base of support Gait velocity: slow Gait velocity interpretation: Below normal speed for age/gender General Gait Details: Slow and guarded, initially relies heavily on RW for support. Cues to widen base of support and push less through UEs as able to increase LE weight bearing. Min assist for stability at times however no buckling of LEs noted.  Stairs            Wheelchair Mobility    Modified Rankin (Stroke Patients Only)       Balance Overall balance assessment: Needs assistance Sitting-balance support: Single extremity supported Sitting balance-Leahy Scale: Poor Sitting balance - Comments: guarded due to pain   Standing balance support: No upper extremity supported Standing balance-Leahy Scale: Fair                               Pertinent Vitals/Pain Pain Assessment: 0-10 Pain Score: 9  Pain Location: back, groin bilaterally Pain Descriptors / Indicators: Aching Pain Intervention(s): Monitored during session;Repositioned    Home Living Family/patient expects to be discharged to:: Private residence Living Arrangements: Spouse/significant  other Available Help at Discharge: Family;Available 24 hours/day Type of Home: House Home Access: Stairs to enter Entrance Stairs-Rails: Doctor, general practice of  Steps: 3 Home Layout: One level Home Equipment: None      Prior Function Level of Independence: Independent               Hand Dominance   Dominant Hand: Right    Extremity/Trunk Assessment   Upper Extremity Assessment: Defer to OT evaluation           Lower Extremity Assessment: Generalized weakness;RLE deficits/detail;LLE deficits/detail (reports normal sensation bil, no numbness/tingling) RLE Deficits / Details: Normal light touch sensation bilaterally throughout LEs. 5/5 strength with ankle DF, hallux extension. strength of knee and hip flex/ext difficult to assess due to pain with higher pressures. Ambulating demonstrates WFL grossly for these muscle grades. LLE Deficits / Details: Normal light touch sensation bilaterally throughout LEs. 5/5 strength with ankle DF, hallux extension. strength of knee and hip flex/ext difficult to assess due to pain with higher pressures. Ambulating demonstrates WFL grossly for these muscle grades.     Communication   Communication: No difficulties  Cognition Arousal/Alertness: Lethargic;Suspect due to medications Behavior During Therapy: [facility] for tasks assessed/performed Overall Cognitive Status: Within Functional Limits for tasks assessed                      General Comments General comments (skin integrity, edema, etc.): Reviewed role of PT, progression, expected outcomes, use of TLSO, and safety with mobility/precautions with patient and wife today.    Exercises        Assessment/Plan    PT Assessment Patient needs continued PT services  PT Diagnosis Difficulty walking;Abnormality of gait;Generalized weakness;Acute pain   PT Problem List Decreased strength;Decreased range of motion;Decreased activity tolerance;Decreased balance;Decreased mobility;Decreased coordination;Decreased knowledge of use of DME;Decreased knowledge of precautions;Pain  PT Treatment Interventions DME instruction;Gait training;Stair  training;Functional mobility training;Therapeutic activities;Therapeutic exercise;Balance training;Neuromuscular re-education;Patient/family education;Modalities   PT Goals (Current goals can be found in the Care Plan section) Acute Rehab PT Goals Patient Stated Goal: Be able to walk without pain PT Goal Formulation: With patient/family Time For Goal Achievement: 06/19/15 Potential to Achieve Goals: Good    Frequency Min 5X/week   Barriers to discharge        Co-evaluation               End of Session Equipment Utilized During Treatment: Gait belt;Back brace Activity Tolerance: Patient limited by pain Patient left: in chair;with call bell/phone within reach;with chair alarm set;with family/visitor present Nurse Communication: Mobility status;Precautions         Time: 0454-0981 PT Time Calculation (min) (ACUTE ONLY): 27 min   Charges:   PT Evaluation $Initial PT Evaluation Tier I: 1 Procedure PT Treatments $Gait Training: 8-22 mins   PT G CodesBerton Mount 06/05/2015, 5:55 PM [name] [place], [place] [phone]

## 2015-06-05 NOTE — Progress Notes (Signed)
Patient arrived to room from [place]. Family at bedside. Safety precautions and orders reviewed with patient/family. Call light and possessions within reach. Will continue to monitor.  Sim Boast, RN

## 2015-06-05 NOTE — Progress Notes (Signed)
Per account summary, patient has been contacted by [facility] with Financial Counseling regarding Medicaid eligibility.  CM will continue to follow for any additional discharge needs.

## 2015-06-06 MED ORDER — DIAZEPAM 5 MG PO TABS
5.0000 mg | ORAL_TABLET | Freq: Four times a day (QID) | ORAL | Status: DC | PRN
  Administered 2015-06-06 – 2015-06-07 (×7): 10 mg via ORAL
  Administered 2015-06-08: 5 mg via ORAL
  Filled 2015-06-06 (×9): qty 2

## 2015-06-06 NOTE — Progress Notes (Signed)
Physical Therapy Treatment Patient Details Name: Kashton Dehlinger MRN: [id number] DOB: 04/10/1966 Today's Date: 06/06/2015    History of Present Illness 49 y.o. male with fall from ladder sustained L2-4FXs and Rt thumb distal phalanx Fx.    PT Comments    Patient making slow progress with mobility, limited by pain.  Follow Up Recommendations  Home health PT;Supervision/Assistance - 24 hour     Equipment Recommendations  Rolling walker with 5" wheels;3in1 (PT)    Recommendations for Other Services OT consult     Precautions / Restrictions Precautions Precautions: Back;Fall Precaution Booklet Issued: Yes (comment) Precaution Comments: reviewed with pt and wife Required Braces or Orthoses: Spinal Brace Spinal Brace: Thoracolumbosacral orthotic;Applied in supine position Restrictions Weight Bearing Restrictions: No    Mobility  Bed Mobility Overal bed mobility: Needs Assistance Bed Mobility: Rolling;Sidelying to Sit;Sit to Sidelying Rolling: Min guard Sidelying to sit: Min guard     Sit to sidelying: Min assist General bed mobility comments: Reviewed log roll technique.  Patient able to roll to both sides to apply TLSO in supine.  Patient able to move to sitting with no physical assist.  On return to supine, patient initially attempted to move sit > supine laying straight back onto bed.  Stopped patient and assisted him to move through sidelying to supine.  Transfers Overall transfer level: Needs assistance Equipment used: Rolling walker (2 wheeled) Transfers: Sit to/from Stand Sit to Stand: Min assist         General transfer comment: Verbal cues for hand placement.  Patient struggled to get to standing, with heavy use of UE's.  Min assist for safety.  Ambulation/Gait Ambulation/Gait assistance: Min assist Ambulation Distance (Feet): 110 Feet Assistive device: Rolling walker (2 wheeled) Gait Pattern/deviations: Step-through pattern;Decreased stride  length;Shuffle Gait velocity: slow Gait velocity interpretation: Below normal speed for age/gender General Gait Details: Verbal cues to try to decrease weight bearing on UE's.  Patient with slow, guarded gait.   Stairs            Wheelchair Mobility    Modified Rankin (Stroke Patients Only)       Balance                                    Cognition Arousal/Alertness: Awake/alert Behavior During Therapy: WFL for tasks assessed/performed Overall Cognitive Status: Within Functional Limits for tasks assessed                      Exercises      General Comments        Pertinent Vitals/Pain Pain Assessment: 0-10 Pain Score: 10-Worst pain ever (with mobility) Pain Location: Back Pain Descriptors / Indicators: Aching Pain Intervention(s): Monitored during session;PCA encouraged;Repositioned    Home Living                      Prior Function            PT Goals (current goals can now be found in the care plan section) Progress towards PT goals: Progressing toward goals    Frequency  Min 5X/week    PT Plan Discharge plan needs to be updated;Equipment recommendations need to be updated    Co-evaluation             End of Session Equipment Utilized During Treatment: Gait belt;Back brace Activity Tolerance: Patient limited by pain;Patient limited by fatigue Patient left:  in bed;with call bell/phone within reach;with family/visitor present     Time: 1610-9604 PT Time Calculation (min) (ACUTE ONLY): 24 min  Charges:  $Gait Training: 8-22 mins $Therapeutic Activity: 8-22 mins                    G Codes:      Vena Austria 2015-06-09, 7:26 PM Durenda Hurt. Renaldo Fiddler, [facility] Acute Rehab Services Pager [phone]

## 2015-06-06 NOTE — Progress Notes (Signed)
Patient in a great deal of pain this morning. The pain itself is confined to his lumbar region but he described it as severe. Patient with associated spasms. No radiating pain. No symptoms of numbness or weakness.  Awake and alert. Oriented and appropriate. Motor and sensory exam stable.  Status post L2-L3 and L4 fractures. Continue current management.

## 2015-06-07 MED ORDER — HYDROMORPHONE HCL 1 MG/ML IJ SOLN
1.0000 mg | INTRAMUSCULAR | Status: DC | PRN
Start: 2015-06-07 — End: 2015-06-10
  Administered 2015-06-07 – 2015-06-10 (×26): 1 mg via INTRAVENOUS
  Filled 2015-06-07 (×26): qty 1

## 2015-06-07 NOTE — Progress Notes (Signed)
Patient ID: Bobby Horn, male   DOB: [birth year], 49 y.o.   MRN: [id number] Subjective:  The patient is alert and pleasant. He feels a bit better. He inquired about discontinuing the PCA pump.  Objective: Vital signs in last 24 hours: Temp:  [98.1 F (36.7 C)-98.8 F (37.1 C)] 98.4 F (36.9 C) (06/19 0947) Pulse Rate:  [55-75] 55 (06/19 0947) Resp:  [10-16] 14 (06/19 0947) BP: (132-162)/(79-95) 150/88 mmHg (06/19 0947) SpO2:  [96 %-100 %] 100 % (06/19 0947)  Intake/Output from previous day: 06/18 0701 - 06/19 0700 In: -  Out: 300 [Urine:300] Intake/Output this shift:    Physical exam patient is alert and oriented. He is moving slower extremity well.  Lab Results: No results for input(s): WBC, HGB, HCT, PLT in the last 72 hours. BMET No results for input(s): NA, K, CL, CO2, GLUCOSE, BUN, CREATININE, CALCIUM in the last 72 hours.  Studies/Results: Dg Lumbar Spine 2-3 Views  06/05/2015   CLINICAL DATA:  Lumbar vertebral fracture  EXAM: LUMBAR SPINE - 2-3 VIEW  COMPARISON:  06/03/2015 lumbar radiographs and CT abdomen/pelvis  FINDINGS: Five non-rib-bearing lumbar vertebra are present on this radiographic exam, and the current exam is labeled accordingly.  This represents a difference in numbering from that utilized on the prior CT exam.  Partial sacralization of the transverse processes of L5.  Superior endplate compression fracture of L1 vertebral body with stable mild anterior height loss.  More significant superior endplate compression fracture of L2 vertebral body, unchanged.  Mild retropulsion of superior L2 fragments posteriorly.  Tiny anterior superior endplate compression deformity of L3 vertebral body unchanged.  No additional fracture or subluxation.  IMPRESSION: Please note differences in numbering of the lumbar vertebral segments versus prior CT.  Superior endplate compression fractures of L2, L1, and L3 vertebral bodies as above.   Electronically Signed   By: Ulyses Southward M.D.    On: 06/05/2015 15:09    Assessment/Plan: L2 fracture: The patient is improving. I will discontinue the PCA pump.  LOS: 4 days     Lazaro Isenhower D 06/07/2015, 11:42 AM

## 2015-06-07 NOTE — Progress Notes (Signed)
Physical Therapy Treatment Patient Details Name: Bobby Horn MRN: [id number] DOB: 1966-11-03 Today's Date: 06/07/2015    History of Present Illness 49 y.o. male with fall from ladder sustained L2-4FXs and Rt thumb distal phalanx Fx.    PT Comments    Patient with 8/10 before PT (received pain injection prior to tx), with pain incr to 9/10 with basic mobility.  Patient demo guarded posture, needs cues as reminders for back precautions during mobility and functional tasks.  Patient may benefit from further skilled PT to improve mobility, reduce fall risk and incr functional independence. Will continue to follow patient while on this venue of care to progress mobility.    Follow Up Recommendations  Home health PT;Supervision/Assistance - 24 hour     Equipment Recommendations  Rolling walker with 5" wheels;3in1 (PT)    Recommendations for Other Services OT consult     Precautions / Restrictions Precautions Precautions: Back;Fall Precaution Booklet Issued: Yes (comment) Precaution Comments: reviewed with pt/wife, pt able to verbalize 3/3 (needs cues during mobility to maintain precautions) Required Braces or Orthoses: Spinal Brace Spinal Brace: Thoracolumbosacral orthotic;Applied in supine position Restrictions Weight Bearing Restrictions: No    Mobility  Bed Mobility Overal bed mobility: Needs Assistance Bed Mobility: Sidelying to Sit;Sit to Supine Rolling: Supervision (with left rail) Sidelying to sit: Supervision (with min cues for back precautions)   Sit to supine: Supervision (with min cues for back precautions)   General bed mobility comments: rolled to left to don TLSO in supine  Transfers Overall transfer level: Needs assistance Equipment used: Rolling walker (2 wheeled) Transfers: Sit to/from Stand Sit to Stand: Supervision (min cues for hand placement)            Ambulation/Gait Ambulation/Gait assistance: Supervision Ambulation Distance (Feet): 160  Feet Assistive device: Rolling walker (2 wheeled) Gait Pattern/deviations: Trunk flexed (heavy reliance on UE support) Gait velocity: slow   General Gait Details: min cues for breathing technique to reduce pain   Stairs Stairs: Yes Stairs assistance: Min guard Stair Management: One rail Left;Sideways;Step to pattern Number of Stairs: 4 General stair comments: demo to pt technique  Wheelchair Mobility    Modified Rankin (Stroke Patients Only)       Balance Overall balance assessment: Needs assistance Sitting-balance support: No upper extremity supported;Feet supported Sitting balance-Leahy Scale: Fair Sitting balance - Comments: guarded posture   Standing balance support: Bilateral upper extremity supported Standing balance-Leahy Scale: Fair                      Cognition Arousal/Alertness: Awake/alert Behavior During Therapy: WFL for tasks assessed/performed Overall Cognitive Status: Within Functional Limits for tasks assessed                      Exercises      General Comments General comments (skin integrity, edema, etc.): reviewed prognosis, brace wear, back precautions, potential benefit of reacher and where to purchase with pt/wife with return understanding      Pertinent Vitals/Pain Pain Assessment: 0-10 Pain Score: 8  Pain Location: back and along abdomen towards the front Pain Descriptors / Indicators: Throbbing;Aching;Sharp;Sore Pain Intervention(s): Limited activity within patient's tolerance;Monitored during session;Premedicated before session;Utilized relaxation techniques    Home Living                      Prior Function            PT Goals (current goals can now be found in the  care plan section) Acute Rehab PT Goals Patient Stated Goal: reduce pain PT Goal Formulation: With patient/family Time For Goal Achievement: 06/19/15 Potential to Achieve Goals: Good Progress towards PT goals: Progressing toward goals     Frequency  Min 5X/week    PT Plan Current plan remains appropriate    Co-evaluation             End of Session Equipment Utilized During Treatment: Gait belt;Back brace Activity Tolerance: Patient limited by pain Patient left: in bed;with call bell/phone within reach;with family/visitor present     Time: 7017-7939 PT Time Calculation (min) (ACUTE ONLY): 39 min  Charges:  $Gait Training: 8-22 mins $Therapeutic Activity: 8-22 mins                    G CodesNestor Horn, [place] [phone]  Bobby Horn 06/07/2015, 3:37 PM

## 2015-06-08 ENCOUNTER — Inpatient Hospital Stay (HOSPITAL_COMMUNITY): Payer: Self-pay

## 2015-06-08 ENCOUNTER — Other Ambulatory Visit: Payer: Self-pay | Admitting: Neurological Surgery

## 2015-06-08 MED ORDER — DIAZEPAM 5 MG PO TABS
5.0000 mg | ORAL_TABLET | Freq: Four times a day (QID) | ORAL | Status: DC | PRN
  Administered 2015-06-08 – 2015-06-09 (×7): 5 mg via ORAL
  Filled 2015-06-08 (×8): qty 1

## 2015-06-08 MED ORDER — ZOLPIDEM TARTRATE 5 MG PO TABS
5.0000 mg | ORAL_TABLET | Freq: Every evening | ORAL | Status: DC | PRN
Start: 2015-06-08 — End: 2015-06-10
  Administered 2015-06-08 – 2015-06-10 (×3): 5 mg via ORAL
  Filled 2015-06-08 (×3): qty 1

## 2015-06-08 NOTE — Progress Notes (Signed)
Subjective: Patient reports severe pain in back after stopping PCA and toradol. No leg pain or NTW  Objective: Vital signs in last 24 hours: Temp:  [98.4 F (36.9 C)-99.7 F (37.6 C)] 98.8 F (37.1 C) (06/20 0622) Pulse Rate:  [53-68] 65 (06/20 0622) Resp:  [14-20] 20 (06/20 0622) BP: (143-159)/(85-94) 153/85 mmHg (06/20 0622) SpO2:  [98 %-100 %] 100 % (06/20 0622)  Intake/Output from previous day: 06/19 0730 - 06/20 0729 In: 120 [P.O.:120] Out: -  Intake/Output this shift:    Neurologic: Grossly normal  Lab Results: Lab Results  Component Value Date   WBC 9.1 06/03/2015   HGB 14.8 06/03/2015   HCT 42.8 06/03/2015   MCV 91.1 06/03/2015   PLT 213 06/03/2015   Lab Results  Component Value Date   INR 1.03 06/03/2015   BMET Lab Results  Component Value Date   NA 140 06/03/2015   K 4.0 06/03/2015   CL 107 06/03/2015   CO2 23 06/03/2015   GLUCOSE 119* 06/03/2015   BUN 22* 06/03/2015   CREATININE 1.10 06/03/2015   CALCIUM 9.3 06/03/2015    Studies/Results: No results found.  Assessment/Plan: check new xrays, improve pain control, may need stabilization?  LOS: 5 days    JONES,DAVID S 06/08/2015, 8:03 AM

## 2015-06-08 NOTE — Progress Notes (Signed)
Physical Therapy Treatment Patient Details Name: Bobby Horn MRN: [id number] DOB: March 27, 1966 Today's Date: 06/08/2015    History of Present Illness 49 y.o. male with fall from ladder sustained L2-4FXs and Rt thumb distal phalanx Fx.    PT Comments    Pt functioning at supervision level despite 10/10 back pain. Pt con't to require re-education on back precautions. Anticipate once pain under control pt with progress quickly. Will await to see if Dr. Yetta Barre does surgery later this week.  Follow Up Recommendations  No PT follow up;Supervision/Assistance - 24 hour     Equipment Recommendations  Rolling walker with 5" wheels;3in1 (PT)    Recommendations for Other Services       Precautions / Restrictions Precautions Precautions: Back;Fall Precaution Booklet Issued: Yes (comment) Precaution Comments: review precautions with pt Required Braces or Orthoses: Spinal Brace Spinal Brace: Thoracolumbosacral orthotic (re-fitted for optimal support) Restrictions Weight Bearing Restrictions: No    Mobility  Bed Mobility Overal bed mobility: Needs Assistance Bed Mobility: Sit to Sidelying Rolling: Supervision       Sit to sidelying: Min assist General bed mobility comments: v/c's for hand placement and how to take brace off  Transfers Overall transfer level: Needs assistance Equipment used: Rolling walker (2 wheeled) Transfers: Sit to/from Stand Sit to Stand: Supervision         General transfer comment: v/c's for safe hand placement, not to push/pull up from walker  Ambulation/Gait Ambulation/Gait assistance: Supervision Ambulation Distance (Feet): 200 Feet Assistive device: Rolling walker (2 wheeled) Gait Pattern/deviations: Trunk flexed (=) Gait velocity: slow   General Gait Details: v/c's to achieve full upright posture   Stairs            Wheelchair Mobility    Modified Rankin (Stroke Patients Only)       Balance           Standing balance  support: During functional activity;Single extremity supported Standing balance-Leahy Scale: Poor Standing balance comment: held onto sink with L UE to brace self while brushing teeth                    Cognition Arousal/Alertness: Awake/alert Behavior During Therapy: WFL for tasks assessed/performed Overall Cognitive Status: Within Functional Limits for tasks assessed                      Exercises      General Comments        Pertinent Vitals/Pain Pain Assessment: 0-10 Pain Score: 8  Pain Location: back Pain Intervention(s): Limited activity within patient's tolerance    Home Living                      Prior Function            PT Goals (current goals can now be found in the care plan section) Acute Rehab PT Goals Patient Stated Goal: reduce pain Progress towards PT goals: Progressing toward goals    Frequency  Min 3X/week    PT Plan Current plan remains appropriate;Frequency needs to be updated    Co-evaluation             End of Session Equipment Utilized During Treatment: Gait belt;Back brace Activity Tolerance: Patient limited by pain Patient left: in bed;with call bell/phone within reach;with family/visitor present     Time: 1324-4010 PT Time Calculation (min) (ACUTE ONLY): 17 min  Charges:  $Gait Training: 8-22 mins  G Codes:      Marcene Brawn 06/08/2015, 4:15 PM  Lewis Shock, PT, DPT Pager #: [phone] Office #: [phone]

## 2015-06-09 MED ORDER — CEFAZOLIN SODIUM-DEXTROSE 2-3 GM-% IV SOLR: 2.0000 g | INTRAVENOUS | Status: DC

## 2015-06-09 MED ORDER — NALOXONE HCL 0.4 MG/ML IJ SOLN: 0.4000 mg | INTRAMUSCULAR | Status: DC | PRN

## 2015-06-09 MED ORDER — BISACODYL 10 MG RE SUPP
10.0000 mg | Freq: Every day | RECTAL | Status: DC | PRN
  Administered 2015-06-09: 10 mg via RECTAL
  Filled 2015-06-09: qty 1

## 2015-06-09 MED ORDER — DEXAMETHASONE SODIUM PHOSPHATE 10 MG/ML IJ SOLN
10.0000 mg | INTRAMUSCULAR | Status: AC
  Administered 2015-06-10: 10 mg via INTRAVENOUS

## 2015-06-09 MED ORDER — CEFAZOLIN SODIUM-DEXTROSE 2-3 GM-% IV SOLR
2.0000 g | INTRAVENOUS | Status: AC
  Administered 2015-06-10: 2 g via INTRAVENOUS

## 2015-06-09 MED ORDER — HYDROMORPHONE 0.3 MG/ML IV SOLN
INTRAVENOUS | Status: DC
  Administered 2015-06-09: 09:00:00 via INTRAVENOUS
  Administered 2015-06-09: 3.3 mg via INTRAVENOUS
  Administered 2015-06-09: 23:00:00 via INTRAVENOUS
  Administered 2015-06-09: 3.6 mg via INTRAVENOUS
  Administered 2015-06-09: 16:00:00 via INTRAVENOUS
  Administered 2015-06-09: 5.4 mg via INTRAVENOUS
  Administered 2015-06-10: 5.1 mg via INTRAVENOUS
  Administered 2015-06-10: 3.9 mg via INTRAVENOUS
  Administered 2015-06-10: 20:00:00 via INTRAVENOUS
  Administered 2015-06-10: 7.37 mg via INTRAVENOUS
  Administered 2015-06-10: 0.3 mg via INTRAVENOUS
  Filled 2015-06-09 (×5): qty 25

## 2015-06-09 MED ORDER — DIPHENHYDRAMINE HCL 12.5 MG/5ML PO ELIX
12.5000 mg | ORAL_SOLUTION | Freq: Four times a day (QID) | ORAL | Status: DC | PRN
  Administered 2015-06-09: 12.5 mg via ORAL
  Filled 2015-06-09: qty 10

## 2015-06-09 MED ORDER — ONDANSETRON HCL 4 MG/2ML IJ SOLN: 4.0000 mg | Freq: Four times a day (QID) | INTRAMUSCULAR | Status: DC | PRN

## 2015-06-09 MED ORDER — SODIUM CHLORIDE 0.9 % IJ SOLN: 9.0000 mL | INTRAMUSCULAR | Status: DC | PRN

## 2015-06-09 MED ORDER — DIPHENHYDRAMINE HCL 50 MG/ML IJ SOLN: 12.5000 mg | Freq: Four times a day (QID) | INTRAMUSCULAR | Status: DC | PRN

## 2015-06-09 NOTE — Progress Notes (Signed)
Subjective: Patient reports continued severe low back pain. No leg pain or numbness tingling or weakness. He cannot mobilize because of his pain. TLSO brace helps.  Objective: Vital signs in last 24 hours: Temp:  [98 F (36.7 C)-99.5 F (37.5 C)] 98 F (36.7 C) (06/21 0649) Pulse Rate:  [55-76] 60 (06/21 0649) Resp:  [18-20] 18 (06/21 0649) BP: (133-164)/(73-96) 164/96 mmHg (06/21 0649) SpO2:  [99 %-100 %] 100 % (06/21 0649)  Intake/Output from previous day:   Intake/Output this shift:    Neurologic: Grossly normal  Lab Results: Lab Results  Component Value Date   WBC 9.1 06/03/2015   HGB 14.8 06/03/2015   HCT 42.8 06/03/2015   MCV 91.1 06/03/2015   PLT 213 06/03/2015   Lab Results  Component Value Date   INR 1.03 06/03/2015   BMET Lab Results  Component Value Date   NA 140 06/03/2015   K 4.0 06/03/2015   CL 107 06/03/2015   CO2 23 06/03/2015   GLUCOSE 119* 06/03/2015   BUN 22* 06/03/2015   CREATININE 1.10 06/03/2015   CALCIUM 9.3 06/03/2015    Studies/Results: Dg Lumbar Spine 2-3 Views  06/08/2015   CLINICAL DATA:  Fall.  Back pain.  EXAM: LUMBAR SPINE - 2-3 VIEW  COMPARISON:  06/05/2015.  FINDINGS: Lumbar vertebra are numbered as per prior lumbar spine series. Compressions are again noted of L1, L2, and L3, particular prominent about L2. Retropulsion of the superior endplate of L2 again noted . No interim change. Aortoiliac atherosclerotic vascular disease.  IMPRESSION: 1. L1, L2, and L3 compression fractures, no change. 2. Aortoiliac atherosclerotic vascular disease .   Electronically Signed   By: Maisie Fus  Register   On: 06/08/2015 08:39    Assessment/Plan: We will plan on ORIF of his L2 fracture tomorrow. We will perform a T12-L4 instrumented fusion. I have described the surgery to him as best I can. He understands the risk of the surgery include but are not limited to bleeding, infection, nerve root injury, spinal cord injury, CSF leak, numbness, weakness,  paralysis, loss of bowel bladder or sexual function, lack of relief of symptoms, worsening symptoms, need for further surgery, instrumentation failure, misplaced instrumentation, adjacent level disease, and anesthesia risk including DVT pneumonia MI and death. He agrees to proceed.   LOS: 6 days    Bobby Horn 06/09/2015, 7:52 AM

## 2015-06-09 NOTE — Progress Notes (Signed)
UR completed.    Kajuan Guyton W. Jameir Ake, RN, BSN  Trauma/Neuro ICU Case Manager [phone] 

## 2015-06-10 ENCOUNTER — Encounter (HOSPITAL_COMMUNITY)
Admission: EM | Disposition: A | Discharge status: Home / Self Care | Payer: Self-pay | Source: Home / Self Care | Attending: Neurological Surgery

## 2015-06-10 ENCOUNTER — Encounter (HOSPITAL_COMMUNITY): Payer: Self-pay | Admitting: Anesthesiology

## 2015-06-10 ENCOUNTER — Inpatient Hospital Stay (HOSPITAL_COMMUNITY): Payer: MEDICAID | Admitting: Anesthesiology

## 2015-06-10 ENCOUNTER — Inpatient Hospital Stay (HOSPITAL_COMMUNITY): Payer: Self-pay

## 2015-06-10 ENCOUNTER — Inpatient Hospital Stay (HOSPITAL_COMMUNITY): Payer: Self-pay | Admitting: Anesthesiology

## 2015-06-10 DIAGNOSIS — Z981 Arthrodesis status: Secondary | ICD-10-CM

## 2015-06-10 LAB — SURGICAL PCR SCREEN
MRSA, PCR: NEGATIVE
Staphylococcus aureus: NEGATIVE

## 2015-06-10 SURGERY — POSTERIOR LUMBAR FUSION 1 LEVEL
Anesthesia: General | Site: Spine Lumbar

## 2015-06-10 MED ORDER — PHENYLEPHRINE 40 MCG/ML (10ML) SYRINGE FOR IV PUSH (FOR BLOOD PRESSURE SUPPORT)
PREFILLED_SYRINGE | INTRAVENOUS | Status: AC
  Filled 2015-06-10: qty 10

## 2015-06-10 MED ORDER — SUCCINYLCHOLINE CHLORIDE 20 MG/ML IJ SOLN
INTRAMUSCULAR | Status: DC | PRN
  Administered 2015-06-10: 100 mg via INTRAVENOUS

## 2015-06-10 MED ORDER — HYDROMORPHONE HCL 1 MG/ML IJ SOLN
INTRAMUSCULAR | Status: AC
  Filled 2015-06-10: qty 2

## 2015-06-10 MED ORDER — POTASSIUM CHLORIDE IN NACL 20-0.9 MEQ/L-% IV SOLN
INTRAVENOUS | Status: DC
  Administered 2015-06-10 – 2015-06-13 (×2): via INTRAVENOUS
  Filled 2015-06-10 (×4): qty 1000

## 2015-06-10 MED ORDER — ARTIFICIAL TEARS OP OINT
TOPICAL_OINTMENT | OPHTHALMIC | Status: AC
  Filled 2015-06-10: qty 3.5

## 2015-06-10 MED ORDER — HYDROMORPHONE HCL 1 MG/ML IJ SOLN
0.5000 mg | Freq: Once | INTRAMUSCULAR | Status: DC
Start: 2015-06-10 — End: 2015-06-11

## 2015-06-10 MED ORDER — SODIUM CHLORIDE 0.9 % IJ SOLN
3.0000 mL | Freq: Two times a day (BID) | INTRAMUSCULAR | Status: DC
  Administered 2015-06-10 – 2015-06-15 (×5): 3 mL via INTRAVENOUS

## 2015-06-10 MED ORDER — PROPOFOL INFUSION 10 MG/ML OPTIME
INTRAVENOUS | Status: DC | PRN
  Administered 2015-06-10: 75 ug/kg/min via INTRAVENOUS
  Administered 2015-06-10: 50 ug/kg/min via INTRAVENOUS

## 2015-06-10 MED ORDER — PHENYLEPHRINE HCL 10 MG/ML IJ SOLN
INTRAMUSCULAR | Status: AC
  Filled 2015-06-10: qty 1

## 2015-06-10 MED ORDER — 0.9 % SODIUM CHLORIDE (POUR BTL) OPTIME
TOPICAL | Status: DC | PRN
  Administered 2015-06-10: 1000 mL

## 2015-06-10 MED ORDER — FENTANYL CITRATE (PF) 100 MCG/2ML IJ SOLN
100.0000 ug | Freq: Once | INTRAMUSCULAR | Status: AC
  Administered 2015-06-10: 100 ug via INTRAVENOUS

## 2015-06-10 MED ORDER — PHENOL 1.4 % MT LIQD: 1.0000 | OROMUCOSAL | Status: DC | PRN

## 2015-06-10 MED ORDER — DIAZEPAM 5 MG PO TABS
ORAL_TABLET | ORAL | Status: AC
  Filled 2015-06-10: qty 1

## 2015-06-10 MED ORDER — LIDOCAINE HCL (CARDIAC) 20 MG/ML IV SOLN
INTRAVENOUS | Status: AC
  Filled 2015-06-10: qty 10

## 2015-06-10 MED ORDER — BUPIVACAINE LIPOSOME 1.3 % IJ SUSP
INTRAMUSCULAR | Status: DC | PRN
  Administered 2015-06-10: 20 mL

## 2015-06-10 MED ORDER — MIDAZOLAM HCL 2 MG/2ML IJ SOLN
INTRAMUSCULAR | Status: AC
  Filled 2015-06-10: qty 2

## 2015-06-10 MED ORDER — MORPHINE SULFATE 2 MG/ML IJ SOLN
1.0000 mg | INTRAMUSCULAR | Status: DC | PRN
  Administered 2015-06-10 – 2015-06-11 (×2): 2 mg via INTRAVENOUS
  Administered 2015-06-11 (×2): 4 mg via INTRAVENOUS
  Filled 2015-06-10: qty 1
  Filled 2015-06-10: qty 2
  Filled 2015-06-10: qty 1
  Filled 2015-06-10: qty 2

## 2015-06-10 MED ORDER — BUPIVACAINE LIPOSOME 1.3 % IJ SUSP
20.0000 mL | INTRAMUSCULAR | Status: DC
Start: 2015-06-10 — End: 2015-06-11
  Filled 2015-06-10: qty 20

## 2015-06-10 MED ORDER — SENNA 8.6 MG PO TABS
1.0000 | ORAL_TABLET | Freq: Two times a day (BID) | ORAL | Status: DC
  Administered 2015-06-10 – 2015-06-15 (×10): 8.6 mg via ORAL
  Filled 2015-06-10 (×10): qty 1

## 2015-06-10 MED ORDER — CEFAZOLIN SODIUM 1-5 GM-% IV SOLN
1.0000 g | Freq: Three times a day (TID) | INTRAVENOUS | Status: AC
  Administered 2015-06-10 – 2015-06-11 (×2): 1 g via INTRAVENOUS
  Filled 2015-06-10 (×2): qty 50

## 2015-06-10 MED ORDER — FENTANYL CITRATE (PF) 250 MCG/5ML IJ SOLN
INTRAMUSCULAR | Status: AC
  Filled 2015-06-10: qty 5

## 2015-06-10 MED ORDER — FENTANYL CITRATE (PF) 100 MCG/2ML IJ SOLN
INTRAMUSCULAR | Status: DC | PRN
  Administered 2015-06-10: 50 ug via INTRAVENOUS
  Administered 2015-06-10: 100 ug via INTRAVENOUS
  Administered 2015-06-10: 50 ug via INTRAVENOUS
  Administered 2015-06-10: 100 ug via INTRAVENOUS
  Administered 2015-06-10: 150 ug via INTRAVENOUS
  Administered 2015-06-10: 100 ug via INTRAVENOUS

## 2015-06-10 MED ORDER — THROMBIN 20000 UNITS EX SOLR
CUTANEOUS | Status: DC | PRN
  Administered 2015-06-10: 20 mL via TOPICAL

## 2015-06-10 MED ORDER — LIDOCAINE HCL (CARDIAC) 20 MG/ML IV SOLN
INTRAVENOUS | Status: DC | PRN
  Administered 2015-06-10: 50 mg via INTRAVENOUS

## 2015-06-10 MED ORDER — ONDANSETRON HCL 4 MG/2ML IJ SOLN: 4.0000 mg | INTRAMUSCULAR | Status: DC | PRN

## 2015-06-10 MED ORDER — SODIUM CHLORIDE 0.9 % IR SOLN
Status: DC | PRN
  Administered 2015-06-10: 500 mL

## 2015-06-10 MED ORDER — SODIUM CHLORIDE 0.9 % IV SOLN: 250.0000 mL | INTRAVENOUS | Status: DC

## 2015-06-10 MED ORDER — EPHEDRINE SULFATE 50 MG/ML IJ SOLN
INTRAMUSCULAR | Status: AC
  Filled 2015-06-10: qty 1

## 2015-06-10 MED ORDER — HYDROMORPHONE HCL 1 MG/ML IJ SOLN
INTRAMUSCULAR | Status: AC
  Filled 2015-06-10: qty 1

## 2015-06-10 MED ORDER — SODIUM CHLORIDE 0.9 % IV SOLN
INTRAVENOUS | Status: DC | PRN
  Administered 2015-06-10: 18:00:00 via INTRAVENOUS

## 2015-06-10 MED ORDER — ONDANSETRON HCL 4 MG/2ML IJ SOLN
INTRAMUSCULAR | Status: AC
  Filled 2015-06-10: qty 2

## 2015-06-10 MED ORDER — SUCCINYLCHOLINE CHLORIDE 20 MG/ML IJ SOLN
INTRAMUSCULAR | Status: AC
  Filled 2015-06-10: qty 1

## 2015-06-10 MED ORDER — MENTHOL 3 MG MT LOZG: 1.0000 | LOZENGE | OROMUCOSAL | Status: DC | PRN

## 2015-06-10 MED ORDER — ONDANSETRON HCL 4 MG/2ML IJ SOLN: 4.0000 mg | Freq: Once | INTRAMUSCULAR | Status: DC | PRN

## 2015-06-10 MED ORDER — HYDROMORPHONE HCL 1 MG/ML IJ SOLN
0.5000 mg | INTRAMUSCULAR | Status: DC | PRN
  Administered 2015-06-10 (×3): 0.5 mg via INTRAVENOUS

## 2015-06-10 MED ORDER — MIDAZOLAM HCL 5 MG/5ML IJ SOLN
INTRAMUSCULAR | Status: DC | PRN
  Administered 2015-06-10: 2 mg via INTRAVENOUS

## 2015-06-10 MED ORDER — BUPIVACAINE HCL (PF) 0.25 % IJ SOLN
INTRAMUSCULAR | Status: DC | PRN
  Administered 2015-06-10: 5 mL

## 2015-06-10 MED ORDER — HYDROMORPHONE HCL 1 MG/ML IJ SOLN
0.5000 mg | INTRAMUSCULAR | Status: AC | PRN
  Administered 2015-06-10 (×4): 0.5 mg via INTRAVENOUS

## 2015-06-10 MED ORDER — NEOSTIGMINE METHYLSULFATE 10 MG/10ML IV SOLN
INTRAVENOUS | Status: AC
  Filled 2015-06-10: qty 1

## 2015-06-10 MED ORDER — ROCURONIUM BROMIDE 50 MG/5ML IV SOLN
INTRAVENOUS | Status: AC
  Filled 2015-06-10: qty 2

## 2015-06-10 MED ORDER — PROPOFOL 10 MG/ML IV BOLUS
INTRAVENOUS | Status: DC | PRN
  Administered 2015-06-10: 20 mg via INTRAVENOUS
  Administered 2015-06-10: 200 mg via INTRAVENOUS

## 2015-06-10 MED ORDER — GLYCOPYRROLATE 0.2 MG/ML IJ SOLN
INTRAMUSCULAR | Status: AC
  Filled 2015-06-10: qty 3

## 2015-06-10 MED ORDER — LACTATED RINGERS IV SOLN
INTRAVENOUS | Status: DC | PRN
  Administered 2015-06-10 (×2): via INTRAVENOUS

## 2015-06-10 MED ORDER — FENTANYL CITRATE (PF) 100 MCG/2ML IJ SOLN
INTRAMUSCULAR | Status: AC
  Filled 2015-06-10: qty 2

## 2015-06-10 MED ORDER — THROMBIN 5000 UNITS EX SOLR
OROMUCOSAL | Status: DC | PRN
  Administered 2015-06-10: 5 mL via TOPICAL

## 2015-06-10 MED ORDER — ACETAMINOPHEN 325 MG PO TABS
650.0000 mg | ORAL_TABLET | ORAL | Status: DC | PRN
  Administered 2015-06-11 – 2015-06-14 (×2): 650 mg via ORAL
  Filled 2015-06-10 (×2): qty 2

## 2015-06-10 MED ORDER — OXYCODONE HCL 5 MG PO TABS
ORAL_TABLET | ORAL | Status: AC
Start: 2015-06-10 — End: 2015-06-11
  Filled 2015-06-10: qty 2

## 2015-06-10 MED ORDER — DIAZEPAM 5 MG PO TABS
5.0000 mg | ORAL_TABLET | Freq: Four times a day (QID) | ORAL | Status: DC | PRN
  Administered 2015-06-10 – 2015-06-16 (×16): 5 mg via ORAL
  Filled 2015-06-10 (×15): qty 1

## 2015-06-10 MED ORDER — GLYCOPYRROLATE 0.2 MG/ML IJ SOLN
INTRAMUSCULAR | Status: AC
  Filled 2015-06-10: qty 1

## 2015-06-10 MED ORDER — OXYCODONE-ACETAMINOPHEN 5-325 MG PO TABS
1.0000 | ORAL_TABLET | ORAL | Status: DC | PRN
  Administered 2015-06-10: 1 via ORAL
  Administered 2015-06-11 (×3): 2 via ORAL
  Filled 2015-06-10: qty 2
  Filled 2015-06-10: qty 1
  Filled 2015-06-10 (×2): qty 2

## 2015-06-10 MED ORDER — CELECOXIB 200 MG PO CAPS
200.0000 mg | ORAL_CAPSULE | Freq: Two times a day (BID) | ORAL | Status: DC
  Administered 2015-06-10 – 2015-06-14 (×9): 200 mg via ORAL
  Filled 2015-06-10 (×9): qty 1

## 2015-06-10 MED ORDER — SODIUM CHLORIDE 0.9 % IJ SOLN: 3.0000 mL | INTRAMUSCULAR | Status: DC | PRN

## 2015-06-10 MED ORDER — ACETAMINOPHEN 650 MG RE SUPP: 650.0000 mg | RECTAL | Status: DC | PRN

## 2015-06-10 SURGICAL SUPPLY — 70 items
APL SKNCLS STERI-STRIP NONHPOA (GAUZE/BANDAGES/DRESSINGS) ×1
BAG DECANTER FOR FLEXI CONT (MISCELLANEOUS) ×2 IMPLANT
BENZOIN TINCTURE PRP APPL 2/3 (GAUZE/BANDAGES/DRESSINGS) ×2 IMPLANT
BIT DRILL PLIF MAS 5.0MM DISP (DRILL) IMPLANT
BLADE CLIPPER SURG (BLADE) IMPLANT
BONE CANC CHIPS 20CC PCAN1/4 (Bone Implant) ×2 IMPLANT
BONE MATRIX OSTEOCEL PRO MED (Bone Implant) ×2 IMPLANT
BUR MATCHSTICK NEURO 3.0 LAGG (BURR) ×2 IMPLANT
CANISTER SUCT 3000ML PPV (MISCELLANEOUS) ×2 IMPLANT
CHIPS CANC BONE 20CC PCAN1/4 (Bone Implant) ×1 IMPLANT
CLIP NEUROVISION LG (CLIP) ×1 IMPLANT
CONT SPEC 4OZ CLIKSEAL STRL BL (MISCELLANEOUS) ×4 IMPLANT
COVER BACK TABLE 60X90IN (DRAPES) ×2 IMPLANT
DRAPE C-ARM 42X72 X-RAY (DRAPES) ×4 IMPLANT
DRAPE LAPAROTOMY 100X72X124 (DRAPES) ×2 IMPLANT
DRAPE POUCH INSTRU U-SHP 10X18 (DRAPES) ×2 IMPLANT
DRAPE SURG 17X23 STRL (DRAPES) ×2 IMPLANT
DRILL PLIF MAS 5.0MM DISP (DRILL) ×2
DRSG OPSITE 4X5.5 SM (GAUZE/BANDAGES/DRESSINGS) ×4 IMPLANT
DRSG OPSITE POSTOP 4X10 (GAUZE/BANDAGES/DRESSINGS) ×1 IMPLANT
DRSG TELFA 3X8 NADH (GAUZE/BANDAGES/DRESSINGS) ×2 IMPLANT
DURAPREP 26ML APPLICATOR (WOUND CARE) ×2 IMPLANT
ELECT REM PT RETURN 9FT ADLT (ELECTROSURGICAL) ×2
ELECTRODE REM PT RTRN 9FT ADLT (ELECTROSURGICAL) ×1 IMPLANT
EVACUATOR 1/8 PVC DRAIN (DRAIN) ×2 IMPLANT
GAUZE SPONGE 4X4 16PLY XRAY LF (GAUZE/BANDAGES/DRESSINGS) IMPLANT
GLOVE BIO SURGEON STRL SZ 6.5 (GLOVE) ×1 IMPLANT
GLOVE BIO SURGEON STRL SZ7 (GLOVE) ×1 IMPLANT
GLOVE BIO SURGEON STRL SZ8 (GLOVE) ×4 IMPLANT
GLOVE BIOGEL PI IND STRL 7.0 (GLOVE) IMPLANT
GLOVE BIOGEL PI INDICATOR 7.0 (GLOVE) ×1
GOWN STRL REUS W/ TWL LRG LVL3 (GOWN DISPOSABLE) IMPLANT
GOWN STRL REUS W/ TWL XL LVL3 (GOWN DISPOSABLE) ×2 IMPLANT
GOWN STRL REUS W/TWL 2XL LVL3 (GOWN DISPOSABLE) IMPLANT
GOWN STRL REUS W/TWL LRG LVL3 (GOWN DISPOSABLE)
GOWN STRL REUS W/TWL XL LVL3 (GOWN DISPOSABLE) ×4
GRAFT BNE CANC CHIPS 1-8 20CC (Bone Implant) IMPLANT
HEMOSTAT POWDER KIT SURGIFOAM (HEMOSTASIS) IMPLANT
KIT BASIN OR (CUSTOM PROCEDURE TRAY) ×2 IMPLANT
KIT NDL NVM5 EMG ELECT (KITS) IMPLANT
KIT NEEDLE NVM5 EMG ELECT (KITS) ×1 IMPLANT
KIT NEEDLE NVM5 EMG ELECTRODE (KITS) ×1
KIT ROOM TURNOVER OR (KITS) ×2 IMPLANT
NDL HYPO 25X1 1.5 SAFETY (NEEDLE) ×1 IMPLANT
NEEDLE HYPO 25X1 1.5 SAFETY (NEEDLE) ×2 IMPLANT
NS IRRIG 1000ML POUR BTL (IV SOLUTION) ×2 IMPLANT
PACK LAMINECTOMY NEURO (CUSTOM PROCEDURE TRAY) ×2 IMPLANT
PAD ARMBOARD 7.5X6 YLW CONV (MISCELLANEOUS) ×6 IMPLANT
PAD DRESSING TELFA 3X8 NADH (GAUZE/BANDAGES/DRESSINGS) ×1 IMPLANT
ROD PRECEPT TI PREBENT 140MM (Rod) ×2 IMPLANT
SCREW LOCK (Screw) ×20 IMPLANT
SCREW LOCK FXNS SPNE MAS PL (Screw) IMPLANT
SCREW MAS PLIF 5.5X30 (Screw) ×2 IMPLANT
SCREW PAS PLIF 5X30 (Screw) IMPLANT
SCREW PLIF MAS 5.0X35 (Screw) ×6 IMPLANT
SCREW SHANK 5.0X35 (Screw) ×2 IMPLANT
SCREW TULIP 5.5 (Screw) ×2 IMPLANT
SPONGE LAP 4X18 X RAY DECT (DISPOSABLE) IMPLANT
SPONGE SURGIFOAM ABS GEL 100 (HEMOSTASIS) ×2 IMPLANT
STRIP CLOSURE SKIN 1/2X4 (GAUZE/BANDAGES/DRESSINGS) ×3 IMPLANT
SUT VIC AB 0 CT1 18XCR BRD8 (SUTURE) ×1 IMPLANT
SUT VIC AB 0 CT1 8-18 (SUTURE) ×4
SUT VIC AB 2-0 CP2 18 (SUTURE) ×3 IMPLANT
SUT VIC AB 3-0 SH 8-18 (SUTURE) ×5 IMPLANT
SYR 20ML ECCENTRIC (SYRINGE) ×2 IMPLANT
TOWEL OR 17X24 6PK STRL BLUE (TOWEL DISPOSABLE) ×2 IMPLANT
TOWEL OR 17X26 10 PK STRL BLUE (TOWEL DISPOSABLE) ×2 IMPLANT
TRAP SPECIMEN MUCOUS 40CC (MISCELLANEOUS) ×1 IMPLANT
TRAY FOLEY W/METER SILVER 14FR (SET/KITS/TRAYS/PACK) ×1 IMPLANT
WATER STERILE IRR 1000ML POUR (IV SOLUTION) ×2 IMPLANT

## 2015-06-10 NOTE — Progress Notes (Signed)
PT Cancellation Note  Patient Details Name: Bobby Horn MRN: [id number] DOB: 10-20-1966   Cancelled Treatment:    Reason Eval/Treat Not Completed: Other (comment) (Surgery planned for this afternoon) Patient states he is awaiting transport for surgery this afternoon. Requests PT be held at this time. Will continue to follow. Please re-order PT following operation.  Berton Mount 06/10/2015, 2:28 PM [name] [place], [place] [phone]

## 2015-06-10 NOTE — Anesthesia Procedure Notes (Signed)
Procedure Name: Intubation Date/Time: 06/10/2015 3:48 PM Performed by: Eligha Bridegroom Pre-anesthesia Checklist: Emergency Drugs available, Patient identified, Timeout performed, Suction available and Patient being monitored Patient Re-evaluated:Patient Re-evaluated prior to inductionOxygen Delivery Method: Circle system utilized Preoxygenation: Pre-oxygenation with 100% oxygen Intubation Type: IV induction Ventilation: Mask ventilation without difficulty Laryngoscope Size: Mac and 4 Grade View: Grade II Tube type: Oral Tube size: 8.0 mm Number of attempts: 1 Airway Equipment and Method: Stylet and LTA kit utilized Placement Confirmation: ETT inserted through vocal cords under direct vision,  breath sounds checked- equal and bilateral and positive ETCO2 Secured at: 23 cm Tube secured with: Tape Dental Injury: Teeth and Oropharynx as per pre-operative assessment

## 2015-06-10 NOTE — Anesthesia Postprocedure Evaluation (Signed)
  Anesthesia Post-op Note  Patient: Bobby Horn  Procedure(s) Performed: Procedure(s): Thoracic Twelve-Lumbar Four Posterior lateral fusion w/pedicle screws (N/A)  Patient Location: PACU  Anesthesia Type:General  Level of Consciousness: awake, alert  and oriented  Airway and Oxygen Therapy: Patient Spontanous Breathing  Post-op Pain: moderate  Post-op Assessment: Post-op Vital signs reviewed LLE Motor Response: Purposeful movement LLE Sensation: Full sensation RLE Motor Response: Purposeful movement RLE Sensation: Full sensation      Post-op Vital Signs: Reviewed  Last Vitals:  Filed Vitals:   06/10/15 1945  BP:   Pulse: 84  Temp: 37 C  Resp: 11    Complications: No apparent anesthesia complications

## 2015-06-10 NOTE — Anesthesia Preprocedure Evaluation (Signed)
Anesthesia Evaluation  Patient identified by MRN, date of birth, ID band Patient awake    Reviewed: Allergy & Precautions, NPO status , Patient's Chart, lab work & pertinent test results  Airway Mallampati: I       Dental   Pulmonary Current Smoker,    Pulmonary exam normal       Cardiovascular Normal cardiovascular exam+ dysrhythmias Atrial Fibrillation     Neuro/Psych    GI/Hepatic   Endo/Other    Renal/GU      Musculoskeletal  (+) Arthritis -,   Abdominal   Peds  Hematology   Anesthesia Other Findings gout  Reproductive/Obstetrics                             Anesthesia Physical Anesthesia Plan  ASA: III  Anesthesia Plan: General   Post-op Pain Management:    Induction: Intravenous  Airway Management Planned: Oral ETT  Additional Equipment:   Intra-op Plan:   Post-operative Plan: Extubation in OR  Informed Consent: I have reviewed the patients History and Physical, chart, labs and discussed the procedure including the risks, benefits and alternatives for the proposed anesthesia with the patient or authorized representative who has indicated his/her understanding and acceptance.     Plan Discussed with: CRNA, Anesthesiologist and Surgeon  Anesthesia Plan Comments:         Anesthesia Quick Evaluation

## 2015-06-10 NOTE — Op Note (Signed)
06/03/2015 - 06/10/2015  6:37 PM  PATIENT:  Richardson Dopp  49 y.o. male  PRE-OPERATIVE DIAGNOSIS:  Unstable L2 fracture  POST-OPERATIVE DIAGNOSIS:  same  PROCEDURE:    1. Posterior fixation T12 to L4 inclusive using cortical pedicle screws.  2. Intertransverse arthrodesis T12-L4 inclusive using morcellized allograft.  SURGEON:  Marikay Alar, MD  ASSISTANTS: Dr. Wynetta Emery  ANESTHESIA:  General  EBL: 400 ml  Total I/O In: 2150 [I.V.:2000; Blood:150] Out: 1075 [Urine:675; Blood:400]  BLOOD ADMINISTERED:200 CC CELLSAVER  DRAINS: Hemovac   INDICATION FOR PROCEDURE: This patient fell 30 feet while trimming a tree. He suffered unstable L2 fracture. We tried to brace him to see if he would do well but he had continued severe pain and could not tolerate mobilization. I recommended fixation of the fracture. Patient understood the risks, benefits, and alternatives and potential outcomes and wished to proceed.  PROCEDURE DETAILS:  The patient was brought to the operating room. After induction of generalized endotracheal anesthesia the patient was rolled into the prone position on chest rolls and all pressure points were padded. The patient's lumbar region was cleaned and then prepped with DuraPrep and draped in the usual sterile fashion. Anesthesia was injected and then a dorsal midline incision was made and carried down to the lumbosacral fascia. The fascia was opened and the paraspinous musculature was taken down in a subperiosteal fashion to expose T12-L4. A self-retaining retractor was placed. Intraoperative fluoroscopy confirmed my level, and I started with placement of the L2 cortical pedicle screws. The pedicle screw entry zones were identified utilizing surface landmarks and  AP and lateral fluoroscopy. I scored the cortex with the high-speed drill and then used the hand drill and EMG monitoring to drill an upward and outward direction into the pedicle. I then tapped line to line, and the tap  was also monitored. I then placed a 5-0 x 35 mm cortical pedicle screw into the pedicles of L2 bilaterally. I then placed screws into T12, L1, L3, and L4 bilaterally utilizing the exact same technique.  I checked my screw placement with AP and lateral fluoroscopy. We then decorticated the lamina from T12-L4 and laid a mixture of morcellized allograft  to perform arthrodesis at T12-L4. We then placed  rods into the multiaxial screw heads of the pedicle screws and locked these in position with the locking caps and anti-torque device. We then checked our construct with AP and lateral fluoroscopy. Irrigated with copious amounts of bacitracin-containing saline solution. Placed a medium Hemovac drain through separate stab incision. closed the muscle and the fascia with 0 Vicryl. Closed the subcutaneous tissues with 2-0 Vicryl and subcuticular tissues with 3-0 Vicryl. The skin was closed with benzoin and Steri-Strips. Dressing was then applied, the patient was awakened from general anesthesia and transported to the recovery room in stable condition. At the end of the procedure all sponge, needle and instrument counts were correct.   PLAN OF CARE: Admit to inpatient   PATIENT DISPOSITION:  PACU - hemodynamically stable.   Delay start of Pharmacological VTE agent (>24hrs) due to surgical blood loss or risk of bleeding:  yes   \

## 2015-06-10 NOTE — Transfer of Care (Signed)
Immediate Anesthesia Transfer of Care Note  Patient: Bobby Horn  Procedure(s) Performed: Procedure(s): Thoracic Twelve-Lumbar Four Posterior lateral fusion w/pedicle screws (N/A)  Patient Location: PACU  Anesthesia Type:General  Level of Consciousness: awake, alert  and oriented  Airway & Oxygen Therapy: Patient Spontanous Breathing and Patient connected to nasal cannula oxygen  Post-op Assessment: Report given to RN and Post -op Vital signs reviewed and stable  Post vital signs: Reviewed and stable  Last Vitals:  Filed Vitals:   06/10/15 1432  BP: 140/86  Pulse: 71  Temp: 37 C  Resp: 12    Complications: No apparent anesthesia complications

## 2015-06-11 MED ORDER — DIPHENHYDRAMINE HCL 12.5 MG/5ML PO ELIX: 12.5000 mg | ORAL_SOLUTION | Freq: Four times a day (QID) | ORAL | Status: DC | PRN

## 2015-06-11 MED ORDER — NALOXONE HCL 0.4 MG/ML IJ SOLN: 0.4000 mg | INTRAMUSCULAR | Status: DC | PRN

## 2015-06-11 MED ORDER — SODIUM CHLORIDE 0.9 % IJ SOLN: 9.0000 mL | INTRAMUSCULAR | Status: DC | PRN

## 2015-06-11 MED ORDER — OXYCODONE HCL 5 MG PO TABS
10.0000 mg | ORAL_TABLET | Freq: Once | ORAL | Status: AC
  Administered 2015-06-11: 10 mg via ORAL
  Filled 2015-06-11: qty 2

## 2015-06-11 MED ORDER — HYDROMORPHONE 0.3 MG/ML IV SOLN
INTRAVENOUS | Status: DC
  Administered 2015-06-11 (×2): via INTRAVENOUS
  Administered 2015-06-12: 5.1 mg via INTRAVENOUS
  Administered 2015-06-12: 3 mg via INTRAVENOUS
  Administered 2015-06-12: 3.4 mg via INTRAVENOUS
  Administered 2015-06-12 (×2): 3.6 mg via INTRAVENOUS
  Administered 2015-06-12: 3.9 mg via INTRAVENOUS
  Administered 2015-06-13: 5.25 mg via INTRAVENOUS
  Administered 2015-06-13: 09:00:00 via INTRAVENOUS
  Administered 2015-06-13: 2.55 mg via INTRAVENOUS
  Administered 2015-06-13: 4.8 mg via INTRAVENOUS
  Administered 2015-06-13: 15:00:00 via INTRAVENOUS
  Administered 2015-06-13 – 2015-06-14 (×2): 0.3 mg via INTRAVENOUS
  Administered 2015-06-14: 2.89 mg via INTRAVENOUS
  Administered 2015-06-14: 0.3 mg via INTRAVENOUS
  Administered 2015-06-14: 3 mg via INTRAVENOUS
  Administered 2015-06-14 (×2): 0.3 mg via INTRAVENOUS
  Administered 2015-06-15: 3.89 mg via INTRAVENOUS
  Administered 2015-06-15: 0.3 mg via INTRAVENOUS
  Filled 2015-06-11 (×13): qty 25

## 2015-06-11 MED ORDER — ONDANSETRON HCL 4 MG/2ML IJ SOLN: 4.0000 mg | Freq: Four times a day (QID) | INTRAMUSCULAR | Status: DC | PRN

## 2015-06-11 MED ORDER — DIPHENHYDRAMINE HCL 50 MG/ML IJ SOLN: 12.5000 mg | Freq: Four times a day (QID) | INTRAMUSCULAR | Status: DC | PRN

## 2015-06-11 MED ORDER — OXYCODONE HCL 5 MG PO TABS
10.0000 mg | ORAL_TABLET | ORAL | Status: DC | PRN
  Administered 2015-06-11 – 2015-06-15 (×21): 10 mg via ORAL
  Filled 2015-06-11 (×21): qty 2

## 2015-06-11 MED ORDER — HYDROMORPHONE 0.3 MG/ML IV SOLN: INTRAVENOUS | Status: DC

## 2015-06-11 MED ORDER — OXYCODONE HCL 5 MG PO TABS
20.0000 mg | ORAL_TABLET | ORAL | Status: DC | PRN
  Administered 2015-06-11: 20 mg via ORAL
  Filled 2015-06-11: qty 4

## 2015-06-11 NOTE — Evaluation (Signed)
Occupational Therapy Evaluation Patient Details Name: Bobby Horn MRN: [id number] DOB: [birth year] Today's Date: 06/11/2015    History of Present Illness 49 y.o. male with fall from ladder sustained L2-4FXs and Rt thumb distal phalanx Fx. s/p Thoracic Twelve-Lumbar Four Posterior lateral fusion 06/10/15.   Clinical Impression   Patient is s/p T12-L4 lateral fusion surgery resulting in functional limitations due to the deficits listed below (see OT problem list). Pt sidelying in bed wearing TLSO upon arrival. Education provided to Pt and spouse on back precautions and brace wear. Pt is limited in ability to complete ADLs at this time due to reported pain being 8/10 seated EOB, increasing to 10/10 upon standing. RN notified that Pt is requesting pain medication. Patient will benefit from skilled OT acutely to increase independence and safety with ADLS to allow discharge home.     Follow Up Recommendations  No OT follow up;Supervision - Intermittent    Equipment Recommendations  3 in 1 bedside comode    Recommendations for Other Services       Precautions / Restrictions Precautions Precautions: Back;Fall Precaution Comments: handout reviewed with Pt and wife Required Braces or Orthoses: Spinal Brace Spinal Brace: Thoracolumbosacral orthotic;Applied in sitting position Restrictions Weight Bearing Restrictions: No      Mobility Bed Mobility Overal bed mobility: Needs Assistance Bed Mobility: Sidelying to Sit;Sit to Sidelying   Sidelying to sit: Mod assist     Sit to sidelying: Min assist General bed mobility comments: Pt sidelying in bed with TLSO on upon OT arrival  Transfers Overall transfer level: Needs assistance Equipment used: Rolling walker (2 wheeled) Transfers: Sit to/from Stand Sit to Stand: Min guard;From elevated surface         General transfer comment: VC's for hand placement    Balance Overall balance assessment: Needs assistance Sitting-balance  support: Feet supported;Single extremity supported   Sitting balance - Comments: guarded posture   Standing balance support: Bilateral upper extremity supported;During functional activity (guarding due to pain)   Standing balance comment: held onto sink with bil UE due to pain when attempting to wash hands                            ADL Overall ADL's : Needs assistance/impaired Eating/Feeding: Independent;Sitting   Grooming: Min guard;Standing   Upper Body Bathing: Minimal assitance;Sitting   Lower Body Bathing: Sit to/from stand;Moderate assistance   Upper Body Dressing : Moderate assistance;Sitting Upper Body Dressing Details (indicate cue type and reason): Education provided to wife on A with donning TLSO Lower Body Dressing: Sit to/from stand;Moderate assistance Lower Body Dressing Details (indicate cue type and reason): min A to don slide in shoes; will require additional A with LB dressing Toilet Transfer: Minimal assistance;Ambulation;BSC;RW   Toileting- Clothing Manipulation and Hygiene: Minimal assistance;Sit to/from stand       Functional mobility during ADLs: Min guard;Rolling walker General ADL Comments: Pt limited by pain at this time     Vision     Perception     Praxis      Pertinent Vitals/Pain Pain Assessment: 0-10 Pain Score: 8  Pain Location: back Pain Descriptors / Indicators: Grimacing;Moaning;Pressure;Aching Pain Intervention(s): Limited activity within patient's tolerance;Monitored during session;Repositioned;Patient requesting pain meds-RN notified;Relaxation     Hand Dominance Right   Extremity/Trunk Assessment Upper Extremity Assessment Upper Extremity Assessment: Overall WFL for tasks assessed   Lower Extremity Assessment Lower Extremity Assessment: Defer to PT evaluation RLE: Unable to fully assess due to  pain LLE: Unable to fully assess due to pain       Communication Communication Communication: No difficulties    Cognition Arousal/Alertness: Awake/alert Behavior During Therapy: WFL for tasks assessed/performed Overall Cognitive Status: Within Functional Limits for tasks assessed       Memory: Decreased recall of precautions             General Comments       Exercises       Shoulder Instructions      Home Living Family/patient expects to be discharged to:: Private residence Living Arrangements: Spouse/significant other Available Help at Discharge: Family;Available 24 hours/day Type of Home: House Home Access: Stairs to enter [employer] of Steps: 3 Entrance Stairs-Rails: Right;Left Home Layout: One level     Bathroom Shower/Tub: Chief Strategy Officer: Standard     Home Equipment: None          Prior Functioning/Environment Level of Independence: Independent        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    OT Diagnosis: Generalized weakness;Acute pain   OT Problem List: Decreased strength;Decreased activity tolerance;Decreased safety awareness;Decreased knowledge of use of DME or AE;Decreased knowledge of precautions;Pain   OT Treatment/Interventions: Self-care/ADL training;Therapeutic exercise;DME and/or AE instruction;Therapeutic activities;Patient/family education;Balance training    OT Goals(Current goals can be found in the care plan section) Acute Rehab OT Goals Patient Stated Goal: to stop hurting OT Goal Formulation: With patient Time For Goal Achievement: 06/25/15 Potential to Achieve Goals: Good ADL Goals Pt Will Perform Grooming: with supervision;standing (maintain precautions) Pt Will Perform Upper Body Dressing: with min assist;sitting (don TLSO) Pt Will Perform Lower Body Dressing: with min assist;with adaptive equipment;sit to/from stand Pt Will Transfer to Toilet: with modified independence;ambulating;bedside commode Pt Will Perform Tub/Shower Transfer: Tub transfer;with min guard assist;ambulating;rolling  walker Additional ADL Goal #1: Pt will verbalize 3/3 precautions in preparation for ADLs  OT Frequency: Min 2X/week   Barriers to D/C:            Co-evaluation              End of Session Equipment Utilized During Treatment: Gait belt;Rolling walker;Back brace Nurse Communication: Mobility status;Precautions;Patient requests pain meds  Activity Tolerance: Patient limited by pain Patient left: in bed;with call bell/phone within reach;with family/visitor present   Time: 1610-9604 OT Time Calculation (min): 30 min Charges:    G-Codes:    Marden Noble 06/11/2015, 1:38 PM

## 2015-06-11 NOTE — Progress Notes (Signed)
OT Cancellation Note  Patient Details Name: Bobby Horn MRN: [id number] DOB: Jul 04, 1966   Cancelled Treatment:    Reason Eval/Treat Not Completed: Pain limiting ability to participate; Spoke with RN March Rummage, Pt reports pain 10/10, waiting on MD for pain medication updates. OT will check back as time allows and appropriate for Pt.  Marden Noble 06/11/2015, 8:59 AM

## 2015-06-11 NOTE — Progress Notes (Signed)
Chaplain was referred by Chaplain. Pt was in considerable pain. Pt said they didn't need anything. Chaplain told Pt to let nurse know of any needs from [place]

## 2015-06-11 NOTE — Evaluation (Addendum)
Physical Therapy Re-Evaluation Patient Details Name: Bobby Horn MRN: [id number] DOB: 1965/12/31 Today's Date: 06/11/2015   History of Present Illness  49 y.o. male with fall from ladder sustained L2-4FXs and Rt thumb distal phalanx Fx. s/p Thoracic Twelve-Lumbar Four Posterior lateral fusion 06/10/15.  Clinical Impression  Patient is seen following the above procedure presenting with functional limitations due to the deficits listed below (see PT Problem List). Bobby Horn was ambulating in hallway without brace when PT arrived today. Brace was then applied and we reviewed precautions and use of brace as prescribed by surgeon. He is ambulating safely but relies heavily on RW for support at times. We were able to adequately complete stair training today and reports he feels confident with this task. Has strong family support from wife. Will continue to follow and progress until discharge. He is very eager to return home. Goals updated appropriately following surgery.     Follow Up Recommendations No PT follow up;Supervision for mobility/OOB    Equipment Recommendations  Rolling walker with 5" wheels    Recommendations for Other Services OT consult     Precautions / Restrictions Precautions Precautions: Back;Fall Precaution Comments: reviewed with pt and wife Required Braces or Orthoses: Spinal Brace Spinal Brace: Thoracolumbosacral orthotic;Applied in sitting position Restrictions Weight Bearing Restrictions: No      Mobility  Bed Mobility               General bed mobility comments: Ambulating when  PT approached patient  Transfers Overall transfer level: Needs assistance Equipment used: Rolling walker (2 wheeled) Transfers: Sit to/from Stand Sit to Stand: Min guard         General transfer comment: Min guard for safety. cues for hand placement and to maintain back precautions. From bed and recliner.  Ambulation/Gait Ambulation/Gait assistance:  Supervision Ambulation Distance (Feet): 225 Feet Assistive device: Rolling walker (2 wheeled) Gait Pattern/deviations: Step-through pattern;Decreased stride length;Narrow base of support;Trunk flexed;Scissoring Gait velocity: slow Gait velocity interpretation: Below normal speed for age/gender General Gait Details: Frequent cues for upright posture and to decrease UE reliance on RW for support. Cues to widen base of support. No loss of balance noted although slow and guarded.  Stairs Stairs: Yes Stairs assistance: Supervision Stair Management: One rail Left;Step to pattern;Sideways Number of Stairs: 5 General stair comments: Practiced sideways approach with single rail on pts Left side. Able to perform without loss of balance or buckling. States he feels confident with task. Cues for foot placement.  Wheelchair Mobility    Modified Rankin (Stroke Patients Only)       Balance                                             Pertinent Vitals/Pain Pain Assessment: 0-10 Pain Score: 8  Pain Location: back Pain Descriptors / Indicators: Aching;Constant Pain Intervention(s): Monitored during session;Repositioned    Home Living Family/patient expects to be discharged to:: Private residence Living Arrangements: Spouse/significant other Available Help at Discharge: Family;Available 24 hours/day Type of Home: House Home Access: Stairs to enter Entrance Stairs-Rails: Doctor, general practice of Steps: 3 Home Layout: One level Home Equipment: None      Prior Function Level of Independence: Independent               Hand Dominance   Dominant Hand: Right    Extremity/Trunk Assessment   Upper Extremity Assessment: Defer  to OT evaluation           Lower Extremity Assessment: Generalized weakness         Communication   Communication: No difficulties  Cognition Arousal/Alertness: Awake/alert Behavior During Therapy: WFL for tasks  assessed/performed Overall Cognitive Status: Within Functional Limits for tasks assessed       Memory: Decreased recall of precautions              General Comments General comments (skin integrity, edema, etc.): Patient was ambulating out of his room with wife, no brace on when PT arrived. Educated on importance of compliance with surgeons instructions and to donne brace at edge of bed. Reviewed precautions and safety with mobility.    Exercises        Assessment/Plan    PT Assessment Patient needs continued PT services  PT Diagnosis Difficulty walking;Abnormality of gait;Generalized weakness;Acute pain   PT Problem List Decreased strength;Decreased range of motion;Decreased activity tolerance;Decreased balance;Decreased mobility;Decreased coordination;Decreased knowledge of use of DME;Decreased knowledge of precautions;Pain  PT Treatment Interventions DME instruction;Gait training;Stair training;Functional mobility training;Therapeutic activities;Therapeutic exercise;Balance training;Neuromuscular re-education;Patient/family education;Modalities   PT Goals (Current goals can be found in the Care Plan section) Acute Rehab PT Goals Patient Stated Goal: Be able to walk without pain PT Goal Formulation: With patient/family Time For Goal Achievement: 07/03/15 Potential to Achieve Goals: Good    Frequency Min 5X/week   Barriers to discharge        Co-evaluation               End of Session Equipment Utilized During Treatment: Gait belt;Back brace Activity Tolerance: Patient tolerated treatment well Patient left: in chair;with call bell/phone within reach;with family/visitor present Nurse Communication: Mobility status;Precautions (pt ambulating without brace)         Time: 9562-1308 PT Time Calculation (min) (ACUTE ONLY): 20 min   Charges:   PT Evaluation $PT Re-evaluation: 1 Procedure     PT G CodesBerton Horn 06/11/2015, 12:07  PM  Bobby Horn, PT [phone]

## 2015-06-11 NOTE — Progress Notes (Addendum)
Received pt from PACU nurse Abagail Kitchens; Aundra Millet had started patient on PCA, repositioned, hung iv fluid, and started SCD for patient.  Patient was complaining of pain being a 10 out of 10.  Vital signs were stable and family was present at bedside.

## 2015-06-11 NOTE — Progress Notes (Signed)
Subjective: Patient reports continued severe back pain without leg pain or numbness tingling or weakness.  Objective: Vital signs in last 24 hours: Temp:  [97.8 F (36.6 C)-99 F (37.2 C)] 99 F (37.2 C) (06/23 0537) Pulse Rate:  [59-92] 92 (06/23 0537) Resp:  [9-20] 16 (06/23 0537) BP: (129-186)/(73-95) 162/89 mmHg (06/23 0537) SpO2:  [96 %-100 %] 100 % (06/23 0537)  Intake/Output from previous day: 06/22 0730 - 06/23 0729 In: 2400 [I.V.:2000; Blood:150] Out: 2645 [Urine:1975; Drains:270; Blood:400] Intake/Output this shift:    Neurologic: Grossly normal  Lab Results: Lab Results  Component Value Date   WBC 9.1 06/03/2015   HGB 14.8 06/03/2015   HCT 42.8 06/03/2015   MCV 91.1 06/03/2015   PLT 213 06/03/2015   Lab Results  Component Value Date   INR 1.03 06/03/2015   BMET Lab Results  Component Value Date   NA 140 06/03/2015   K 4.0 06/03/2015   CL 107 06/03/2015   CO2 23 06/03/2015   GLUCOSE 119* 06/03/2015   BUN 22* 06/03/2015   CREATININE 1.10 06/03/2015   CALCIUM 9.3 06/03/2015    Studies/Results: Dg Lumbar Spine Complete  06/10/2015   CLINICAL DATA:  Multiple lumbar spine compression fractures from L1-L3. Posterior lumbar fusion from T12-L4.  EXAM: DG C-ARM 61-120 MIN; LUMBAR SPINE - COMPLETE 4+ VIEW  COMPARISON:  06/08/2015  FINDINGS: Intraoperative spot radiographs show placement of pedicle screws and posterior fixation rods from levels of T12 -L4. Compression fractures of the L1, L2, and L3 vertebral bodies are again seen, most severe at L2.  IMPRESSION: Posterior lumbar fusion hardware placement from T12-L4. L1, L2, and L3 vertebral body compression fractures again noted, most severe at L2.   Electronically Signed   By: Myles Rosenthal M.D.   On: 06/10/2015 18:46   Dg C-arm 1-60 Min  06/10/2015   CLINICAL DATA:  Multiple lumbar spine compression fractures from L1-L3. Posterior lumbar fusion from T12-L4.  EXAM: DG C-ARM 61-120 MIN; LUMBAR SPINE - COMPLETE 4+  VIEW  COMPARISON:  06/08/2015  FINDINGS: Intraoperative spot radiographs show placement of pedicle screws and posterior fixation rods from levels of T12 -L4. Compression fractures of the L1, L2, and L3 vertebral bodies are again seen, most severe at L2.  IMPRESSION: Posterior lumbar fusion hardware placement from T12-L4. L1, L2, and L3 vertebral body compression fractures again noted, most severe at L2.   Electronically Signed   By: Myles Rosenthal M.D.   On: 06/10/2015 18:46    Assessment/Plan: Continued severe back pain postop day 1 after ORIF of L2 fracture. -Continue pain management as best we can   LOS: 8 days    Bobby Horn S 06/11/2015, 9:39 AM

## 2015-06-12 MED FILL — Sodium Chloride IV Soln 0.9%: INTRAVENOUS | Qty: 2000 | Status: AC

## 2015-06-12 MED FILL — Heparin Sodium (Porcine) Inj 1000 Unit/ML: INTRAMUSCULAR | Qty: 30 | Status: AC

## 2015-06-12 NOTE — Progress Notes (Signed)
Physical Therapy Treatment Patient Details Name: Bobby Horn MRN: [id number] DOB: May 25, 1966 Today's Date: 06/12/2015    History of Present Illness 49 y.o. male with fall from ladder sustained L2-4FXs and Rt thumb distal phalanx Fx. s/p Thoracic Twelve-Lumbar Four Posterior lateral fusion 06/10/15.    PT Comments    Good progress towards physical therapy goals. Safely navigated stairs today and ambulates up to 550 feet with a rolling walker a supervision level. Still very guarded and slow but steady and careful. Feel he is adequate for d/c from a mobility standpoint when medically ready. Will follow and progress until d/c.  Follow Up Recommendations  No PT follow up;Supervision for mobility/OOB     Equipment Recommendations  Rolling walker with 5" wheels    Recommendations for Other Services OT consult     Precautions / Restrictions Precautions Precautions: Back;Fall Precaution Comments: reviewed with pt and wife  Required Braces or Orthoses: Spinal Brace Spinal Brace: Thoracolumbosacral orthotic;Applied in sitting position Restrictions Weight Bearing Restrictions: No    Mobility  Bed Mobility Overal bed mobility: Modified Independent             General bed mobility comments: Requires extra time  Transfers Overall transfer level: Needs assistance Equipment used: Rolling walker (2 wheeled) Transfers: Sit to/from Stand Sit to Stand: Min guard         General transfer comment: Min guard for safety. VC to maintain back precautions. Difficulty bringing hands from bed to RW causing pain.  Ambulation/Gait Ambulation/Gait assistance: Supervision Ambulation Distance (Feet): 550 Feet Assistive device: Rolling walker (2 wheeled) Gait Pattern/deviations: Step-through pattern;Decreased stride length;Narrow base of support;Trunk flexed;Scissoring Gait velocity: slow   General Gait Details: VC for upright posture and to widen base of support. Still leaning heavily on RW.  No loss of balance during bout. Very guarded and slow but stable with RW.    Stairs Stairs: Yes Stairs assistance: Supervision Stair Management: One rail Right;Step to pattern;Sideways Number of Stairs: 2 General stair comments: Pt eager to practice stairs a second time. Performed at a supervision level without need for physical assist. Cues for upright posture and sequencing.  Wheelchair Mobility    Modified Rankin (Stroke Patients Only)       Balance                                    Cognition Arousal/Alertness: Awake/alert Behavior During Therapy: WFL for tasks assessed/performed Overall Cognitive Status: Within Functional Limits for tasks assessed       Memory: Decreased recall of precautions              Exercises      General Comments        Pertinent Vitals/Pain Pain Assessment: 0-10 Pain Score: 10-Worst pain ever Pain Location: back Pain Descriptors / Indicators: Aching Pain Intervention(s): Monitored during session;Repositioned;Premedicated before session;Limited activity within patient's tolerance;PCA encouraged;Relaxation    Home Living                      Prior Function            PT Goals (current goals can now be found in the care plan section) Acute Rehab PT Goals Patient Stated Goal: Be able to walk without pain PT Goal Formulation: With patient/family Time For Goal Achievement: 07/03/15 Potential to Achieve Goals: Good Progress towards PT goals: Progressing toward goals    Frequency  Min 5X/week  PT Plan Current plan remains appropriate    Co-evaluation             End of Session Equipment Utilized During Treatment: Gait belt;Back brace Activity Tolerance: Patient tolerated treatment well Patient left: with call bell/phone within reach;with family/visitor present (sitting EOB with family)     Time: 9169-4503 PT Time Calculation (min) (ACUTE ONLY): 27 min  Charges:  $Gait Training: 23-37  mins                    G Codes:      Bobby Horn 06-16-15, 2:40 PM Bobby Horn Bobby Horn, Bobby Horn [phone]

## 2015-06-12 NOTE — Progress Notes (Signed)
Subjective: Patient reports continued pain, but did walk with PT. Legs ok, no NTW  Objective: Vital signs in last 24 hours: Temp:  [98.1 F (36.7 C)-100 F (37.8 C)] 98.6 F (37 C) (06/24 0558) Pulse Rate:  [72-90] 89 (06/24 0558) Resp:  [12-19] 16 (06/24 0800) BP: (128-169)/(69-91) 129/70 mmHg (06/24 0558) SpO2:  [98 %-100 %] 99 % (06/24 0800) FiO2 (%):  [98 %-100 %] 100 % (06/24 0413)  Intake/Output from previous day: 06/23 0730 - 06/24 0729 In: 11.6 [I.V.:11.6] Out: 1060 [Urine:850; Drains:210] Intake/Output this shift:    Neurologic: Grossly normal  Lab Results: Lab Results  Component Value Date   WBC 9.1 06/03/2015   HGB 14.8 06/03/2015   HCT 42.8 06/03/2015   MCV 91.1 06/03/2015   PLT 213 06/03/2015   Lab Results  Component Value Date   INR 1.03 06/03/2015   BMET Lab Results  Component Value Date   NA 140 06/03/2015   K 4.0 06/03/2015   CL 107 06/03/2015   CO2 23 06/03/2015   GLUCOSE 119* 06/03/2015   BUN 22* 06/03/2015   CREATININE 1.10 06/03/2015   CALCIUM 9.3 06/03/2015    Studies/Results: Dg Lumbar Spine Complete  06/10/2015   CLINICAL DATA:  Multiple lumbar spine compression fractures from L1-L3. Posterior lumbar fusion from T12-L4.  EXAM: DG C-ARM 61-120 MIN; LUMBAR SPINE - COMPLETE 4+ VIEW  COMPARISON:  06/08/2015  FINDINGS: Intraoperative spot radiographs show placement of pedicle screws and posterior fixation rods from levels of T12 -L4. Compression fractures of the L1, L2, and L3 vertebral bodies are again seen, most severe at L2.  IMPRESSION: Posterior lumbar fusion hardware placement from T12-L4. L1, L2, and L3 vertebral body compression fractures again noted, most severe at L2.   Electronically Signed   By: Myles Rosenthal M.D.   On: 06/10/2015 18:46   Dg C-arm 1-60 Min  06/10/2015   CLINICAL DATA:  Multiple lumbar spine compression fractures from L1-L3. Posterior lumbar fusion from T12-L4.  EXAM: DG C-ARM 61-120 MIN; LUMBAR SPINE - COMPLETE 4+  VIEW  COMPARISON:  06/08/2015  FINDINGS: Intraoperative spot radiographs show placement of pedicle screws and posterior fixation rods from levels of T12 -L4. Compression fractures of the L1, L2, and L3 vertebral bodies are again seen, most severe at L2.  IMPRESSION: Posterior lumbar fusion hardware placement from T12-L4. L1, L2, and L3 vertebral body compression fractures again noted, most severe at L2.   Electronically Signed   By: Myles Rosenthal M.D.   On: 06/10/2015 18:46    Assessment/Plan: Continue pain control and mobilization   LOS: 9 days    Morenike Cuff S 06/12/2015, 9:36 AM

## 2015-06-12 NOTE — Progress Notes (Signed)
Occupational Therapy Treatment Patient Details Name: Bobby Horn MRN: [id number] DOB: 1966/01/14 Today's Date: 06/12/2015    History of present illness 49 y.o. male with fall from ladder sustained L2-4FXs and Rt thumb distal phalanx Fx. s/p Thoracic Twelve-Lumbar Four Posterior lateral fusion 06/10/15.   OT comments  Pt premedicated before OT session. Pt lying in bed wearing TLSO upon arrival, complaining of pain. Pt now on PCA pump in attempt to control pain. Pt unable to verbalize precautions and required VC's to maintain precautions during functional activities. Pt completed grooming at sink with min guard A and VC's for precautions. Pt continues to require education on when to wear brace and when not to wear brace; Pt instructed to take brace off when laying in bed. Pt does not attempt to independently adjust brace, requiring total A to reposition and tighten brace upon standing. Pt's reported pain is limiting ability to complete ADLs at this time. Pt would benefit from continued acute OT to increase safety and independence with ADLs prior to d/c home.      Follow Up Recommendations  No OT follow up;Supervision - Intermittent    Equipment Recommendations  3 in 1 bedside comode    Recommendations for Other Services      Precautions / Restrictions Precautions Precautions: Back;Fall Required Braces or Orthoses: Spinal Brace Spinal Brace: Thoracolumbosacral orthotic;Applied in sitting position Restrictions Weight Bearing Restrictions: No       Mobility Bed Mobility Overal bed mobility: Needs Assistance Bed Mobility: Sidelying to Sit   Sidelying to sit: Min assist       General bed mobility comments: Pt sidelying in bed with TLSO on upon OT arrival  Transfers Overall transfer level: Needs assistance Equipment used: Rolling walker (2 wheeled) Transfers: Sit to/from Stand Sit to Stand: Min guard;From elevated surface         General transfer comment: VC's for hand  placement and to maintain back precautions.    Balance Overall balance assessment: Needs assistance Sitting-balance support: Feet supported;Bilateral upper extremity supported   Sitting balance - Comments: guarded posture   Standing balance support: Single extremity supported;During functional activity                       ADL Overall ADL's : Needs assistance/impaired Eating/Feeding: Independent;Sitting   Grooming: Wash/dry hands;Wash/dry face;Oral care;Min guard;Standing (cuing for back precautions)           Upper Body Dressing : Total assistance;Standing (adjust TLSO)                   Functional mobility during ADLs: Min guard;Rolling walker General ADL Comments: Pt laying in bed wearing TLSO; education provided on taking brace off when laying in bed. Pt does not attempt to adjust brace independently; requiring total A with donning and adjusting. Due to pain, Pt now on PCA pump which limited ability to complete ADLs.       Vision                     Perception     Praxis      Cognition   Behavior During Therapy: [facility] for tasks assessed/performed Overall Cognitive Status: Within Functional Limits for tasks assessed       Memory: Decreased recall of precautions               Extremity/Trunk Assessment               Exercises  Shoulder Instructions       General Comments      Pertinent Vitals/ Pain       Pain Assessment: 0-10 Pain Score: 6  Pain Location: back Pain Descriptors / Indicators: Aching;Constant Pain Intervention(s): Limited activity within patient's tolerance;Monitored during session;Repositioned;Relaxation;Premedicated before session  Home Living                                          Prior Functioning/Environment              Frequency Min 2X/week     Progress Toward Goals  OT Goals(current goals can now be found in the care plan section)  Progress towards OT goals:  Progressing toward goals  Acute Rehab OT Goals Patient Stated Goal: to stop hurting OT Goal Formulation: With patient Time For Goal Achievement: 06/25/15 Potential to Achieve Goals: Good ADL Goals Pt Will Perform Grooming: with supervision;standing Pt Will Perform Upper Body Dressing: with min assist;sitting Pt Will Perform Lower Body Dressing: with min assist;with adaptive equipment;sit to/from stand Pt Will Transfer to Toilet: with modified independence;ambulating;bedside commode Pt Will Perform Tub/Shower Transfer: Tub transfer;with min guard assist;ambulating;rolling walker Additional ADL Goal #1: Pt will verbalize 3/3 precautions in preparation for ADLs  Plan Discharge plan remains appropriate    Co-evaluation                 End of Session Equipment Utilized During Treatment: Gait belt;Rolling walker;Back brace   Activity Tolerance Patient limited by pain   Patient Left in chair;with call bell/phone within reach;with family/visitor present   Nurse Communication Mobility status;Precautions        Time: 1610-9604 OT Time Calculation (min): 30 min  Charges: OT General Charges $OT Visit: 1 Procedure OT Treatments $Self Care/Home Management : 23-37 mins  Marden Noble 06/12/2015, 10:18 AM

## 2015-06-13 NOTE — Progress Notes (Signed)
No issues overnight. Pt continues to report significant back pain. Was able to walk with PT yesterday.  EXAM:  BP 132/87 mmHg  Pulse 93  Temp(Src) 98.6 F (37 C) (Oral)  Resp 14  Ht 6\' 1"  (1.854 m)  Wt 111.131 kg (245 lb)  BMI 32.33 kg/m2  SpO2 100%  Awake, alert, oriented  Speech fluent, appropriate  CN grossly intact  5/5 BUE/BLE   IMPRESSION:  49 y.o. male POD#3 T12-L4 stabilization, recovering as expected  PLAN: - Cont to mobilize with PT/OT - Pt doesn't feel ready to go home yet primarily because of pain.

## 2015-06-13 NOTE — Progress Notes (Signed)
Patient up walking in hallway/walker and sitting up in chair.

## 2015-06-13 NOTE — Plan of Care (Signed)
Problem: Acute Rehab OT Goals (only OT should resolve) Goal: Pt. Will Perform Tub/Shower Transfer Outcome: Not Met (add Reason) Pt. And spouse decline need for tub transfer as they will be sponge bathing initially.

## 2015-06-13 NOTE — Progress Notes (Signed)
Occupational Therapy Treatment Patient Details Name: Bobby Horn MRN: [id number] DOB: 07-31-1966 Today's Date: 06/13/2015    History of present illness 49 y.o. male with fall from ladder sustained L2-4FXs and Rt thumb distal phalanx Fx. s/p Thoracic Twelve-Lumbar Four Posterior lateral fusion 06/10/15.   OT comments  Education on donning brace and assistance with bed mobility provided to pts. Wife who was present for session and states she will be taking care of pt. Upon d/c home.  Reviewed tub transfer.  Pt. And spouse decline need stating they will sponge bathe initially.  Decline A/E also as wife reports she will help with this also.  Reviewed back precautions and need to integrate them into all functional mobility.  No further questions or needs. Will notify OTR/L pt. Clear for d/c from acute OT.    Follow Up Recommendations  No OT follow up;Supervision - Intermittent    Equipment Recommendations  3 in 1 bedside comode    Recommendations for Other Services      Precautions / Restrictions Precautions Precautions: Back;Fall Precaution Comments: reviewed with pt and wife  Required Braces or Orthoses: Spinal Brace Spinal Brace: Thoracolumbosacral orthotic;Applied in sitting position       Mobility Bed Mobility Overal bed mobility: Needs Assistance Bed Mobility: Rolling;Sidelying to Sit Rolling: Supervision Sidelying to sit: Min assist       General bed mobility comments: Requires extra time, provided demo for wife on how to assist bringing trunk upright as pt. unable to without assist, HOB flat, no rail, and exits from the left side   Transfers Overall transfer level: Needs assistance Equipment used: Rolling walker (2 wheeled) Transfers: Sit to/from [facility] Sit to Stand: Min guard Stand pivot transfers: Min guard       General transfer comment: Min guard for safety. VC to maintain back precautions. Difficulty bringing hands from bed to RW causing  pain, also max cues to reach for arm rests of recliner before sitting down    Balance                                   ADL Overall ADL's : Needs assistance/impaired Eating/Feeding: Independent;Sitting             Lower Body Bathing Details (indicate cue type and reason): wife present and states she will assist, both pt. and spouse decline need for A/E   Upper Body Dressing Details (indicate cue type and reason): Education provided to wife on A with donning TLSO, had wife don and adjust brace for pt. to simulate home environment setting   Lower Body Dressing Details (indicate cue type and reason): wife present and states she will assist, both pt. and spouse decline need for A/E Toilet Transfer: Minimal assistance;Ambulation;RW Toilet Transfer Details (indicate cue type and reason): simulated to bsc/b.room with eob x5 setps to recliner Toileting- Clothing Manipulation and Hygiene: Minimal assistance;Sit to/from stand;Cueing for back precautions Toileting - Clothing Manipulation Details (indicate cue type and reason): reviewed with pt. and spouse options for performing peri-care while maintaining back precautions   Tub/Shower Transfer Details (indicate cue type and reason): pt. and spouse declined need for review stating tub is very high and they feel more comfortable sponge bathing at this time and will do tub transfer once pt.s pain is under control Functional mobility during ADLs: Min guard;Rolling walker General ADL Comments: wife present for session.  had pt. and spouse take the lead  of session to prepare for d/c home and simulate home environment.  wife able to return demo of donning and adjusting brace.  states she will assist with all LB ADLS.  both pt. and spouse decline need for tub transfer and review as wife reports she will aslo help with all LB bathing      Vision                     Perception     Praxis      Cognition   Behavior During  Therapy: Agitated Overall Cognitive Status: Within Functional Limits for tasks assessed                       Extremity/Trunk Assessment               Exercises     Shoulder Instructions       General Comments      Pertinent Vitals/ Pain       Pain Assessment: 0-10 Pain Score: 10-Worst pain ever Pain Location: back Pain Descriptors / Indicators: Aching Pain Intervention(s): Monitored during session;PCA encouraged;Limited activity within patient's tolerance;Repositioned;Utilized relaxation techniques (encouraged pt. to perform deep breathing and relax his shoulders and ues while seated as he was visibly tense making pain worse)  Home Living                                          Prior Functioning/Environment              Frequency Min 2X/week     Progress Toward Goals  OT Goals(current goals can now be found in the care plan section)  Progress towards OT goals: Goals met/education completed, patient discharged from [place] Discharge plan remains appropriate    Co-evaluation                 End of Session Equipment Utilized During Treatment: Gait belt;Rolling walker;Oxygen   Activity Tolerance Patient limited by pain   Patient Left in chair;with call bell/phone within reach;with family/visitor present   Nurse Communication          Time: 7412-8786 OT Time Calculation (min): 26 min  Charges: OT General Charges $OT Visit: 1 Procedure OT Treatments $Self Care/Home Management : 23-37 mins  Janice Coffin, COTA/L 06/13/2015, 7:42 AM

## 2015-06-13 NOTE — Progress Notes (Signed)
Patient says he needs assistance with food, SW consulted for assistance with food at discharge.

## 2015-06-14 NOTE — Progress Notes (Signed)
LCSW aware of consult for SNF. Per PT/OT evaluations it appears patient has no follow up needs warranted for CSW and placement.  Per CM note, patient needs assistance with food? LCSW called patient nurse to get more information if patient is needing homeless resources or information about food stamps. Unable to reach CM and RN was unclear either about the referral.  Will have unit CSW to follow up on Monday for unknown needs at this time.    Deretha Emory, MSW Clinical Social Work: Emergency Room [phone]

## 2015-06-14 NOTE — Progress Notes (Signed)
Subjective: Patient reports still quite painful  Wants to continue PCA.  Objective: Vital signs in last 24 hours: Temp:  [97.8 F (36.6 C)-98.8 F (37.1 C)] 98.5 F (36.9 C) (06/26 0611) Pulse Rate:  [79-98] 79 (06/26 0611) Resp:  [12-16] 16 (06/26 0611) BP: (113-132)/(59-87) 113/64 mmHg (06/26 0611) SpO2:  [92 %-100 %] 100 % (06/26 0611)  Intake/Output from previous day: 06/25 0701 - 06/26 0700 In: -  Out: 1150 [Urine:1150] Intake/Output this shift:    Physical Exam: Strength full in legs, up to chair in brace.  Drain minimal output.  Lab Results: No results for input(s): WBC, HGB, HCT, PLT in the last 72 hours. BMET No results for input(s): NA, K, CL, CO2, GLUCOSE, BUN, CREATININE, CALCIUM in the last 72 hours.  Studies/Results: No results found.  Assessment/Plan: D/C drain.  Continue PCA.  Add anti-spasmodics.  Continue PT.    LOS: 11 days    Wessie Shanks D, MD 06/14/2015, 8:16 AM

## 2015-06-15 MED ORDER — MORPHINE SULFATE 4 MG/ML IJ SOLN
2.0000 mg | INTRAMUSCULAR | Status: DC | PRN
  Administered 2015-06-15 (×2): 2 mg via INTRAVENOUS
  Filled 2015-06-15 (×2): qty 1

## 2015-06-15 MED ORDER — CELECOXIB 200 MG PO CAPS
200.0000 mg | ORAL_CAPSULE | Freq: Two times a day (BID) | ORAL | Status: DC
  Administered 2015-06-15: 200 mg via ORAL
  Filled 2015-06-15: qty 1

## 2015-06-15 MED ORDER — HYDROMORPHONE HCL 2 MG PO TABS
4.0000 mg | ORAL_TABLET | ORAL | Status: DC | PRN
  Administered 2015-06-15 – 2015-06-16 (×3): 4 mg via ORAL
  Filled 2015-06-15 (×3): qty 2

## 2015-06-15 NOTE — Progress Notes (Signed)
PCA discontinued prior to writers shift.  PCA set up and syringe still in patients room.  PCA syringe removed and wasted 11ml of Hydromorphone 0.3mg /ml in sink, Guy Francoindy Bussey, Charity fundraiserN as a witness.

## 2015-06-15 NOTE — Progress Notes (Signed)
Physical Therapy Treatment Patient Details Name: Bobby Horn MRN: [id number] DOB: 01-16-1966 Today's Date: 06/15/2015    History of Present Illness 49 y.o. male with fall from ladder sustained L2-4FXs and Rt thumb distal phalanx Fx. s/p Thoracic Twelve-Lumbar Four Posterior lateral fusion 06/10/15.    PT Comments    Patient with low pain tolerance impacting mobility. Fatigues during gait as exhibited by increased knee flexion and instability noted towards end of ambulation. Tolerated stair training with supervision for safety. Reviewed back precautions. Wife assists with bed mobility and donning TLSO. Will continue to follow to maximize independence and mobility prior to return home.   Follow Up Recommendations  No PT follow up;Supervision for mobility/OOB     Equipment Recommendations  Rolling walker with 5" wheels    Recommendations for Other Services       Precautions / Restrictions Precautions Precautions: Back;Fall Precaution Booklet Issued: Yes (comment) Precaution Comments: reviewed with pt and wife  Required Braces or Orthoses: Spinal Brace Spinal Brace: Thoracolumbosacral orthotic;Applied in sitting position Restrictions Weight Bearing Restrictions: No    Mobility  Bed Mobility Overal bed mobility: Needs Assistance Bed Mobility: Rolling;Sidelying to Sit Rolling: Supervision Sidelying to sit: Min assist;HOB elevated       General bed mobility comments: Wife assisting pt with bed mobility and to bring trunk to seated position. Use of rails. Increased time due to pain.   Transfers Overall transfer level: Needs assistance Equipment used: Rolling walker (2 wheeled) Transfers: Sit to/from Stand Sit to Stand: Min guard         General transfer comment: Min guard for safety. VC to maintain back precautions.  Ambulation/Gait Ambulation/Gait assistance: Min guard Ambulation Distance (Feet): 550 Feet Assistive device: Rolling walker (2 wheeled) Gait  Pattern/deviations: Step-through pattern;Decreased stride length;Narrow base of support;Trunk flexed;Scissoring Gait velocity: slow   General Gait Details: VC for upright posture and to widen base of support. Increased knee flexion bilaterally. Trembling BUEs towards end of gait (2/2 to pain?). Still leaning heavily on RW. No loss of balance during bout. Very guarded and slow but stable with RW.    Stairs Stairs: Yes Stairs assistance: Supervision Stair Management: One rail Left;Step to pattern;Sideways Number of Stairs: 2 (x2 bouts) General stair comments: Reviewed sequencing.   Wheelchair Mobility    Modified Rankin (Stroke Patients Only)       Balance Overall balance assessment: Needs assistance Sitting-balance support: Feet supported;No upper extremity supported Sitting balance-Leahy Scale: Fair Sitting balance - Comments: guarded posture   Standing balance support: During functional activity Standing balance-Leahy Scale: Poor                      Cognition Arousal/Alertness: Awake/alert Behavior During Therapy: WFL for tasks assessed/performed Overall Cognitive Status: Within Functional Limits for tasks assessed                      Exercises      General Comments General comments (skin integrity, edema, etc.): Wife donned TLSO independently without assist. Pt only willing to work with therapy if pain medication closely follows.      Pertinent Vitals/Pain Pain Assessment: 0-10 Pain Score: 10-Worst pain ever Pain Location: back at surgical site Pain Descriptors / Indicators: Aching;Sore;Crying Pain Intervention(s): Limited activity within patient's tolerance;Monitored during session;Patient requesting pain meds-RN notified;Repositioned    Home Living                      Prior Function  PT Goals (current goals can now be found in the care plan section) Progress towards PT goals: Progressing toward goals    Frequency   Min 5X/week    PT Plan Current plan remains appropriate    Co-evaluation             End of Session Equipment Utilized During Treatment: Back brace;Gait belt Activity Tolerance: Patient limited by pain Patient left: with call bell/phone within reach;with family/visitor present     Time: [id number] PT Time Calculation (min) (ACUTE ONLY): 17 min  Charges:  $Gait Training: 8-22 mins                    G Codes:      Bobby Horn A Bobby Horn 06/15/2015, 1:56 PM Bobby RedShauna Quentavious Horn, PT, DPT [phone]

## 2015-06-15 NOTE — Progress Notes (Signed)
Pt reports that none of the pain medication ordered is relieving his pain. Pt requested for this nurse to notify MD Jones. MD Yetta Barre notified. No new orders at this time.

## 2015-06-15 NOTE — Progress Notes (Signed)
Subjective: Patient reports an T needed spasm like back pain. Had some numbness and tingling in the legs last night for the first time. Otherwise no leg pain and no weakness.  Objective: Vital signs in last 24 hours: Temp:  [97.9 F (36.6 C)-98.5 F (36.9 C)] 98.1 F (36.7 C) (06/27 0554) Pulse Rate:  [75-81] 81 (06/27 0554) Resp:  [13-18] 16 (06/27 0554) BP: (116-128)/(66-84) 120/84 mmHg (06/27 0554) SpO2:  [100 %] 100 % (06/27 0554) FiO2 (%):  [98 %-100 %] 100 % (06/27 0533)  Intake/Output from previous day: 06/26 0730 - 06/27 0729 In: 480 [P.O.:480] Out: 900 [Urine:900] Intake/Output this shift:    Neurologic: Grossly normal  Lab Results: Lab Results  Component Value Date   WBC 9.1 06/03/2015   HGB 14.8 06/03/2015   HCT 42.8 06/03/2015   MCV 91.1 06/03/2015   PLT 213 06/03/2015   Lab Results  Component Value Date   INR 1.03 06/03/2015   BMET Lab Results  Component Value Date   NA 140 06/03/2015   K 4.0 06/03/2015   CL 107 06/03/2015   CO2 23 06/03/2015   GLUCOSE 119* 06/03/2015   BUN 22* 06/03/2015   CREATININE 1.10 06/03/2015   CALCIUM 9.3 06/03/2015    Studies/Results: No results found.  Assessment/Plan: Pain control remains an issue. I gently pulled off his dressing today and it made him scream so he has an exaggerated pain response. We'll stop PCA today and start moving toward oral pain medications only.   LOS: 12 days    Fiorella Hanahan S 06/15/2015, 8:23 AM

## 2015-06-15 NOTE — Progress Notes (Signed)
Patient still having complaints of discomfort and spasms to lower back, states "I have some tingling & numbness to both legs, but I can still move them".  Requesting to speak with Dr. Yetta BarreJones patient was inform that the doctor will make rounds this morning.  On-call for nights was notified and will inform Dr. Yetta BarreJones.

## 2015-06-16 MED ORDER — HYDROMORPHONE HCL 4 MG PO TABS: 4.0000 mg | ORAL_TABLET | Freq: Four times a day (QID) | ORAL | Status: DC | PRN

## 2015-06-16 MED ORDER — DIAZEPAM 5 MG PO TABS: 5.0000 mg | ORAL_TABLET | Freq: Four times a day (QID) | ORAL | Status: DC | PRN

## 2015-06-16 NOTE — Progress Notes (Signed)
Patient DC'd home via car with wife.  DC instructions and prescriptions given to patient.  Both fully understood.  Vital signs and assessments were stable.  

## 2015-06-16 NOTE — Progress Notes (Signed)
Physical Therapy Treatment Patient Details Name: Bobby Horn MRN: [id number] DOB: 07/29/1966 Today's Date: 06/16/2015    History of Present Illness 49 y.o. male with fall from ladder sustained L2-4FXs and Rt thumb distal phalanx Fx. s/p Thoracic Twelve-Lumbar Four Posterior lateral fusion 06/10/15.    PT Comments    Patient progressing towards PT goals, mobilizing well. Educated and simulated car transfers. Discussed activity expectations and patient with good recall of restrictions.  Follow Up Recommendations  No PT follow up;Supervision for mobility/OOB     Equipment Recommendations  Rolling walker with 5" wheels    Recommendations for Other Services       Precautions / Restrictions Precautions Precautions: Back;Fall Precaution Booklet Issued: Yes (comment) Precaution Comments: reviewed with pt and wife  Required Braces or Orthoses: Spinal Brace Spinal Brace: Thoracolumbosacral orthotic;Applied in sitting position    Mobility  Bed Mobility               General bed mobility comments: received in chair, wife assisted OOB this am with donning of brace  Transfers Overall transfer level: Needs assistance Equipment used: Rolling walker (2 wheeled) Transfers: Sit to/from Stand Sit to Stand: Supervision         General transfer comment: No physical assist required, cues for hand placement  Ambulation/Gait Ambulation/Gait assistance: Supervision Ambulation Distance (Feet): 320 Feet Assistive device: Rolling walker (2 wheeled) Gait Pattern/deviations: Step-through pattern;Decreased stride length;Narrow base of support;Trunk flexed;Scissoring Gait velocity: decreased   General Gait Details: VCs for upright posture and postioning.   Stairs            Wheelchair Mobility    Modified Rankin (Stroke Patients Only)       Balance     Sitting balance-Leahy Scale: Fair Sitting balance - Comments: guarded posture   Standing balance support: During  functional activity Standing balance-Leahy Scale: Fair                      Cognition Arousal/Alertness: Awake/alert Behavior During Therapy: WFL for tasks assessed/performed Overall Cognitive Status: Within Functional Limits for tasks assessed                      Exercises      General Comments General comments (skin integrity, edema, etc.): educated and simulated car transfer, discussed mobility expectations and pain management tips for discharge home      Pertinent Vitals/Pain Pain Assessment: 0-10 Pain Score: 7  Pain Location: back Pain Descriptors / Indicators: Aching Pain Intervention(s): Monitored during session;Repositioned    Home Living                      Prior Function            PT Goals (current goals can now be found in the care plan section) Acute Rehab PT Goals Patient Stated Goal: Be able to walk without pain PT Goal Formulation: With patient/family Time For Goal Achievement: 07/03/15 Potential to Achieve Goals: Good Progress towards PT goals: Progressing toward goals    Frequency  Min 5X/week    PT Plan Current plan remains appropriate    Co-evaluation             End of Session Equipment Utilized During Treatment: Back brace;Gait belt Activity Tolerance: Patient limited by pain Patient left: with call bell/phone within reach;with family/visitor present     Time: 0726-0742 PT Time Calculation (min) (ACUTE ONLY): 16 min  Charges:  $Self Care/Home Management: 8-22  G CodesFabio Asa:      Jeanell Mangan J 06/16/2015, 7:45 AM Charlotte Crumbevon Chinedu Agustin, PT DPT  [phone]

## 2015-06-16 NOTE — Discharge Summary (Signed)
Physician Discharge Summary  Patient ID: Bobby Horn MRN: [id number] DOB/AGE: 49-Jun-1967 49 y.o.  Admit date: 06/03/2015 Discharge date: 06/16/2015  Admission Diagnoses: L2 fracture   Discharge Diagnoses: same   Discharged Condition: stable  Hospital Course: The patient was admitted on 06/03/2015 with an L2 fracture. He was managed for days in a brace to see if we could avoid surgery, but his pain was too great and he could mobilize. He was taken to the operating room where the patient underwent lumbar fusion T10-L4 to stabilize the frcature. The patient tolerated the procedure well and was taken to the recovery room and then to the floor in stable condition. The hospital course was routine. There were no complications. The wound remained clean dry and intact. Pt had appropriate back soreness. No complaints of leg pain or new N/T/W. The patient remained afebrile with stable vital signs, and tolerated a regular diet. The patient continued to increase activities, and pain was well controlled with oral pain medications.   Consults: trauma, R thumb injury  Significant Diagnostic Studies:  Results for orders placed or performed during the hospital encounter of 06/03/15  MRSA PCR Screening  Result Value Ref Range   MRSA by PCR NEGATIVE NEGATIVE  Surgical pcr screen  Result Value Ref Range   MRSA, PCR NEGATIVE NEGATIVE   Staphylococcus aureus NEGATIVE NEGATIVE  CDS serology  Result Value Ref Range   CDS serology specimen STAT   Comprehensive metabolic panel  Result Value Ref Range   Sodium 140 135 - 145 mmol/L   Potassium 4.0 3.5 - 5.1 mmol/L   Chloride 107 101 - 111 mmol/L   CO2 23 22 - 32 mmol/L   Glucose, Bld 119 (H) 65 - 99 mg/dL   BUN 22 (H) 6 - 20 mg/dL   Creatinine, Ser 7.82 0.61 - 1.24 mg/dL   Calcium 9.3 8.9 - 95.6 mg/dL   Total Protein 6.9 6.5 - 8.1 g/dL   Albumin 4.1 3.5 - 5.0 g/dL   AST 32 15 - 41 U/L   ALT 25 17 - 63 U/L   Alkaline Phosphatase 61 38 - 126 U/L   Total  Bilirubin 0.5 0.3 - 1.2 mg/dL   GFR calc non Af Amer >60 >60 mL/min   GFR calc Af Amer >60 >60 mL/min   Anion gap 10 5 - 15  CBC  Result Value Ref Range   WBC 9.1 4.0 - 10.5 K/uL   RBC 4.70 4.22 - 5.81 MIL/uL   Hemoglobin 14.8 13.0 - 17.0 g/dL   HCT 21.3 08.6 - 57.8 %   MCV 91.1 78.0 - 100.0 fL   MCH 31.5 26.0 - 34.0 pg   MCHC 34.6 30.0 - 36.0 g/dL   RDW 46.9 62.9 - 52.8 %   Platelets 213 150 - 400 K/uL  Ethanol  Result Value Ref Range   Alcohol, Ethyl (B) <5 <5 mg/dL  Protime-INR  Result Value Ref Range   Prothrombin Time 13.7 11.6 - 15.2 seconds   INR 1.03 0.00 - 1.49  I-Stat Chem 8, ED  (not at [facility], [facility])  Result Value Ref Range   Sodium 141 135 - 145 mmol/L   Potassium 3.9 3.5 - 5.1 mmol/L   Chloride 105 101 - 111 mmol/L   BUN 26 (H) 6 - 20 mg/dL   Creatinine, Ser 4.13 0.61 - 1.24 mg/dL   Glucose, Bld 244 (H) 65 - 99 mg/dL   Calcium, Ion 0.10 2.72 - 1.23 mmol/L   TCO2 22 0 -  100 mmol/L   Hemoglobin 15.0 13.0 - 17.0 g/dL   HCT 16.1 09.6 - 04.5 %  Sample to Blood Bank  Result Value Ref Range   Blood Bank Specimen SAMPLE AVAILABLE FOR TESTING    Sample Expiration 06/04/2015     Dg Lumbar Spine 2-3 Views  06/08/2015   CLINICAL DATA:  Fall.  Back pain.  EXAM: LUMBAR SPINE - 2-3 VIEW  COMPARISON:  06/05/2015.  FINDINGS: Lumbar vertebra are numbered as per prior lumbar spine series. Compressions are again noted of L1, L2, and L3, particular prominent about L2. Retropulsion of the superior endplate of L2 again noted . No interim change. Aortoiliac atherosclerotic vascular disease.  IMPRESSION: 1. L1, L2, and L3 compression fractures, no change. 2. Aortoiliac atherosclerotic vascular disease .   Electronically Signed   By: Maisie Fus  Register   On: 06/08/2015 08:39   Dg Lumbar Spine 2-3 Views  06/05/2015   CLINICAL DATA:  Lumbar vertebral fracture  EXAM: LUMBAR SPINE - 2-3 VIEW  COMPARISON:  06/03/2015 lumbar radiographs and CT abdomen/pelvis  FINDINGS: Five non-rib-bearing lumbar  vertebra are present on this radiographic exam, and the current exam is labeled accordingly.  This represents a difference in numbering from that utilized on the prior CT exam.  Partial sacralization of the transverse processes of L5.  Superior endplate compression fracture of L1 vertebral body with stable mild anterior height loss.  More significant superior endplate compression fracture of L2 vertebral body, unchanged.  Mild retropulsion of superior L2 fragments posteriorly.  Tiny anterior superior endplate compression deformity of L3 vertebral body unchanged.  No additional fracture or subluxation.  IMPRESSION: Please note differences in numbering of the lumbar vertebral segments versus prior CT.  Superior endplate compression fractures of L2, L1, and L3 vertebral bodies as above.   Electronically Signed   By: Ulyses Southward M.D.   On: 06/05/2015 15:09   Dg Lumbar Spine Complete  06/10/2015   CLINICAL DATA:  Multiple lumbar spine compression fractures from L1-L3. Posterior lumbar fusion from T12-L4.  EXAM: DG C-ARM 61-120 MIN; LUMBAR SPINE - COMPLETE 4+ VIEW  COMPARISON:  06/08/2015  FINDINGS: Intraoperative spot radiographs show placement of pedicle screws and posterior fixation rods from levels of T12 -L4. Compression fractures of the L1, L2, and L3 vertebral bodies are again seen, most severe at L2.  IMPRESSION: Posterior lumbar fusion hardware placement from T12-L4. L1, L2, and L3 vertebral body compression fractures again noted, most severe at L2.   Electronically Signed   By: Myles Rosenthal M.D.   On: 06/10/2015 18:46   Ct Head Wo Contrast  06/03/2015   CLINICAL DATA:  Fall 30 feet. Loss of consciousness and back pain. Headache.  EXAM: CT HEAD WITHOUT CONTRAST  CT CERVICAL SPINE WITHOUT CONTRAST  TECHNIQUE: Multidetector CT imaging of the head and cervical spine was performed following the standard protocol without intravenous contrast. Multiplanar CT image reconstructions of the cervical spine were also  generated.  COMPARISON:  None.  FINDINGS: CT HEAD FINDINGS  Skull and Sinuses:Negative for fracture or destructive process.  Mild scattered inflammatory mucosal thickening in the paranasal sinuses  Orbits: No acute abnormality.  Brain: No evidence of acute infarction, hemorrhage, hydrocephalus, or mass lesion/mass effect. Dilated perivascular space below the left putamen.  CT CERVICAL SPINE FINDINGS  Negative for acute fracture or subluxation. No prevertebral edema. No gross cervical canal hematoma.  Degenerative disc narrowing and spurring focally at C5-6. There is upper cervical facet arthropathy, greatest on the right at C2-3.  IMPRESSION: No evidence of intracranial or cervical spine injury.   Electronically Signed   By: Marnee Spring M.D.   On: 06/03/2015 12:47   Ct Cervical Spine Wo Contrast  06/03/2015   CLINICAL DATA:  Fall 30 feet. Loss of consciousness and back pain. Headache.  EXAM: CT HEAD WITHOUT CONTRAST  CT CERVICAL SPINE WITHOUT CONTRAST  TECHNIQUE: Multidetector CT imaging of the head and cervical spine was performed following the standard protocol without intravenous contrast. Multiplanar CT image reconstructions of the cervical spine were also generated.  COMPARISON:  None.  FINDINGS: CT HEAD FINDINGS  Skull and Sinuses:Negative for fracture or destructive process.  Mild scattered inflammatory mucosal thickening in the paranasal sinuses  Orbits: No acute abnormality.  Brain: No evidence of acute infarction, hemorrhage, hydrocephalus, or mass lesion/mass effect. Dilated perivascular space below the left putamen.  CT CERVICAL SPINE FINDINGS  Negative for acute fracture or subluxation. No prevertebral edema. No gross cervical canal hematoma.  Degenerative disc narrowing and spurring focally at C5-6. There is upper cervical facet arthropathy, greatest on the right at C2-3.  IMPRESSION: No evidence of intracranial or cervical spine injury.   Electronically Signed   By: Marnee Spring M.D.   On:  06/03/2015 12:47   Ct Abdomen Pelvis W Contrast  06/03/2015   CLINICAL DATA:  Back pain post 30 feet fall from ladder  EXAM: CT ABDOMEN AND PELVIS WITH CONTRAST  TECHNIQUE: Multidetector CT imaging of the abdomen and pelvis was performed using the standard protocol following bolus administration of intravenous contrast.  CONTRAST:  OMNIPAQUE IOHEXOL 300 MG/ML  SOLN  COMPARISON:  None.  FINDINGS: Lung bases are unremarkable.  No lower rib fractures are noted.  Enhanced liver is unremarkable appearance of liver laceration. No calcified gallstones are noted within gallbladder. Atherosclerotic calcifications of abdominal aorta and common iliac arteries. No aortic aneurysm. No evidence of aortic injury.  Enhanced pancreas, spleen and adrenal glands are unremarkable. Enhanced kidneys are symmetrical in size. No hydronephrosis or hydroureter.  No thickened or dilated small bowel loops. No evidence of renal laceration. Delayed renal images shows bilateral renal symmetrical excretion. Bilateral visualized proximal ureter is unremarkable.  Moderate stool noted in right colon and transverse colon. No pericecal inflammation. Normal appendix is noted in axial image 60.  No evidence of urinary bladder injury. Prostate gland and seminal vesicles are unremarkable.  In axial image there is y shaped acute mild compression fracture anterior aspect of upper endplate of L2 vertebral body. There is no evidence of paraspinal hematoma. The spinal canal is intact at this level. The neural foramina are patent at this level.  In axial image 41 there is significant comminuted burst compression fracture of L3 vertebral body. The fracture line is traversing the entire vertebral body. Multiple slight anterior protruding bony fragments upper endplate. There is at least 5 mm bony protrusion in spinal canal. There is moderate spinal canal stenosis. Bilateral neural foramina are intact.  There is mild comminuted compression fracture superior  endplate of L4 vertebral body. There is no bony protrusion in spinal canal. Tiny anterior bony protrusion upper endplate of L4. The neuro foramina are patent.  No sacral fracture. There is mild widening of facet joint space bilaterally at L3 L4 and L5 level best seen on sagittal images 86 and 87.  Coronal images shows bilateral hip joint symmetrical in appearance. No hip fracture is noted. Bilateral proximal femur is unremarkable.  IMPRESSION: 1. Mild comminuted compression fracture upper endplate of L2 and L4  vertebral bodies. No spinal canal stenosis or foraminal stenosis at this level. There is significant comminuted burst compression fracture of L3 vertebral body. There is about 5 mm bony protrusion in spinal canal with moderate spinal canal stenosis. No neuroforaminal stenosis at this level. Mild symmetrical widening of facet joint space at L3, L4 and L5 level. Question fragmented osteophytes right facets at L4 level. Please see sagittal image 63. 2. No acute visceral injury within abdomen or pelvis. 3. No hydronephrosis or hydroureter. Bilateral renal symmetrical excretion. 4. Normal appendix.  No pericecal inflammation. 5. No evidence of urinary bladder injury. 6. No pelvic fractures are identified.  No lower rib fractures. These results were called by telephone at the time of interpretation on 06/03/2015 at 1:06 pm to Dr. Pricilla LovelessSCOTT GOLDSTON , who verbally acknowledged these results.   Electronically Signed   By: Natasha MeadLiviu  Pop M.D.   On: 06/03/2015 13:06   Dg Pelvis Portable  06/03/2015   CLINICAL DATA:  Patient fell 30 feet from tree  EXAM: PORTABLE PELVIS 1-2 VIEWS  COMPARISON:  None.  FINDINGS: There is no demonstrable fracture or dislocation. Joint spaces appear intact. No erosive change.  IMPRESSION: No fracture or dislocation.  No appreciable arthropathy.   Electronically Signed   By: Bretta BangWilliam  Woodruff III M.D.   On: 06/03/2015 12:31   Dg Hand 2 View Right  06/04/2015   CLINICAL DATA:  Injury  EXAM: RIGHT  HAND - 2 VIEW  COMPARISON:  None.  FINDINGS: There is no oblique view. This limits the examination. There is an acute intra-articular fracture at the base of the distal phalanx of the thumb. Degenerative changes in the IP joints are present.  IMPRESSION: Intra-articular fracture involving the distal phalanx of the thumb.   Electronically Signed   By: Jolaine ClickArthur  Hoss M.D.   On: 06/04/2015 09:51   Dg Chest Portable 1 View  06/03/2015   CLINICAL DATA:  Patient fell 30 feet off tree  EXAM: PORTABLE CHEST - 1 VIEW  COMPARISON:  None.  FINDINGS: Lungs clear. Heart size and pulmonary vascularity are normal. Mediastinal contours are within normal limits. No adenopathy. No pneumothorax. No bone lesions.  IMPRESSION: Lungs clear.  No pneumothorax apparent.   Electronically Signed   By: Bretta BangWilliam  Woodruff III M.D.   On: 06/03/2015 12:29   Dg Thoracic Spine 1vclearing  06/03/2015   CLINICAL DATA:  Status post fall from a tree. Upper lumbar pain. Initial encounter.  EXAM: Lateral views of the thoracic spine.  COMPARISON:  CT abdomen and pelvis earlier this same day.  FINDINGS: Vertebral body height appears maintained throughout the thoracic spine. L2 and L3 compression fractures seen on CT scan are noted. Anterior endplate spurring is seen at T11-12.  IMPRESSION: Negative for thoracic spine fracture.  L2 and L3 compression fractures as seen on prior CT.   Electronically Signed   By: Drusilla Kannerhomas  Dalessio M.D.   On: 06/03/2015 13:45   Dg Lumbar Spine 2-3vclearing  06/03/2015   CLINICAL DATA:  Status post fall out of a tree.  EXAM: LIMITED LUMBAR SPINE FOR TRAUMA CLEARING - 2-3 VIEW  COMPARISON:  None.  FINDINGS: Clearing cross-table lateral radiograph shows no definite evidence of lumbar spine fracture or subluxation. Note that this is not a complete radiographic evaluation.  There are 5 nonrib bearing lumbar-type vertebral bodies. There is a mild compression fracture of the L2 vertebral body. There is a mildly comminuted  compression fracture of the L4 vertebral body with approximately 50% height loss. There  is a small fracture involving the anterior superior corner of the L4 vertebral body.The alignment is anatomic. There is no spondylolysis. The disc spaces are maintained. The SI joints are unremarkable.  IMPRESSION: 1. Mild compression fracture of the L2 vertebral body. 2. Mildly comminuted compression fracture of the L4 vertebral body with approximately 50% height loss. 3. Small fracture involving the anterior superior corner of the L4 vertebral body.   Electronically Signed   By: Elige Ko   On: 06/03/2015 13:44   Dg C-arm 1-60 Min  06/10/2015   CLINICAL DATA:  Multiple lumbar spine compression fractures from L1-L3. Posterior lumbar fusion from T12-L4.  EXAM: DG C-ARM 61-120 MIN; LUMBAR SPINE - COMPLETE 4+ VIEW  COMPARISON:  06/08/2015  FINDINGS: Intraoperative spot radiographs show placement of pedicle screws and posterior fixation rods from levels of T12 -L4. Compression fractures of the L1, L2, and L3 vertebral bodies are again seen, most severe at L2.  IMPRESSION: Posterior lumbar fusion hardware placement from T12-L4. L1, L2, and L3 vertebral body compression fractures again noted, most severe at L2.   Electronically Signed   By: Myles Rosenthal M.D.   On: 06/10/2015 18:46    Antibiotics:  Anti-infectives    Start     Dose/Rate Route Frequency Ordered Stop   06/10/15 2200  ceFAZolin (ANCEF) IVPB 1 g/50 mL premix     1 g 100 mL/hr over 30 Minutes Intravenous Every 8 hours 06/10/15 2159 06/11/15 0615   06/10/15 1617  bacitracin 50,000 Units in sodium chloride irrigation 0.9 % 500 mL irrigation  Status:  Discontinued       As needed 06/10/15 1617 06/10/15 1848   06/10/15 1600  ceFAZolin (ANCEF) IVPB 2 g/50 mL premix  Status:  Discontinued     2 g 100 mL/hr over 30 Minutes Intravenous To ShortStay Surgical 06/09/15 1250 06/09/15 1254   06/10/15 1600  ceFAZolin (ANCEF) IVPB 2 g/50 mL premix     2 g 100 mL/hr  over 30 Minutes Intravenous To Surgery 06/09/15 1254 06/10/15 1550      Discharge Exam: Blood pressure 142/89, pulse 75, temperature 97.8 F (36.6 C), temperature source Oral, resp. rate 16, height 6\' 1"  (1.854 m), weight 245 lb (111.131 kg), SpO2 100 %. Neurologic: Grossly normal Incision CDI  Discharge Medications:     Medication List    STOP taking these medications        HYDROcodone-acetaminophen 10-325 MG per tablet  Commonly known as:  NORCO     ibuprofen 200 MG tablet  Commonly known as:  ADVIL,MOTRIN      TAKE these medications        alprazolam 2 MG tablet  Commonly known as:  XANAX  Take 2 mg by mouth 3 (three) times daily as needed for sleep or anxiety.     diazepam 5 MG tablet  Commonly known as:  VALIUM  Take 1 tablet (5 mg total) by mouth every 6 (six) hours as needed for muscle spasms.     HYDROmorphone 4 MG tablet  Commonly known as:  DILAUDID  Take 1 tablet (4 mg total) by mouth every 6 (six) hours as needed for severe pain.        Disposition: home, wear TLSO whenever OOB  Final Dx: L2 fracture      Discharge Instructions    Call MD for:  difficulty breathing, headache or visual disturbances    Complete by:  As directed      Call MD for:  persistant  nausea and vomiting    Complete by:  As directed      Call MD for:  redness, tenderness, or signs of infection (pain, swelling, redness, odor or green/yellow discharge around incision site)    Complete by:  As directed      Call MD for:  severe uncontrolled pain    Complete by:  As directed      Call MD for:  temperature >100.4    Complete by:  As directed      Diet - low sodium heart healthy    Complete by:  As directed      Discharge instructions    Complete by:  As directed   may shower, no heavy lifting, climbing or strenuous activity, no driving     Increase activity slowly    Complete by:  As directed            Follow-up Information    Follow up with YATES,MARK C, MD In 1 week.    Specialty:  Orthopedic Surgery   Contact information:   [address] [place] [address] [phone]       Follow up with Tia Alert, MD. Schedule an appointment as soon as possible for a visit in 2 weeks.   Specialty:  Neurosurgery   Contact information:   [address] [place] [address] [phone]        Signed: Tia Alert 06/16/2015, 8:01 AM

## 2015-06-24 ENCOUNTER — Encounter (HOSPITAL_COMMUNITY): Payer: Self-pay

## 2015-08-14 ENCOUNTER — Emergency Department (HOSPITAL_COMMUNITY)
Admission: EM | Admit: 2015-08-14 | Discharge: 2015-08-14 | Disposition: A | Discharge status: Home / Self Care | Payer: Self-pay | Attending: Emergency Medicine | Admitting: Emergency Medicine

## 2015-08-14 ENCOUNTER — Emergency Department (HOSPITAL_COMMUNITY): Payer: Self-pay

## 2015-08-14 ENCOUNTER — Encounter (HOSPITAL_COMMUNITY): Payer: Self-pay | Admitting: *Deleted

## 2015-08-14 DIAGNOSIS — I25119 Atherosclerotic heart disease of native coronary artery with unspecified angina pectoris: Secondary | ICD-10-CM | POA: Insufficient documentation

## 2015-08-14 DIAGNOSIS — I10 Essential (primary) hypertension: Secondary | ICD-10-CM | POA: Insufficient documentation

## 2015-08-14 DIAGNOSIS — Z720 Tobacco use: Secondary | ICD-10-CM | POA: Insufficient documentation

## 2015-08-14 DIAGNOSIS — M545 Low back pain, unspecified: Secondary | ICD-10-CM

## 2015-08-14 DIAGNOSIS — M109 Gout, unspecified: Secondary | ICD-10-CM | POA: Insufficient documentation

## 2015-08-14 DIAGNOSIS — F419 Anxiety disorder, unspecified: Secondary | ICD-10-CM | POA: Insufficient documentation

## 2015-08-14 DIAGNOSIS — Z87448 Personal history of other diseases of urinary system: Secondary | ICD-10-CM | POA: Insufficient documentation

## 2015-08-14 DIAGNOSIS — G894 Chronic pain syndrome: Secondary | ICD-10-CM | POA: Insufficient documentation

## 2015-08-14 DIAGNOSIS — M1990 Unspecified osteoarthritis, unspecified site: Secondary | ICD-10-CM | POA: Insufficient documentation

## 2015-08-14 DIAGNOSIS — Z8719 Personal history of other diseases of the digestive system: Secondary | ICD-10-CM | POA: Insufficient documentation

## 2015-08-14 MED ORDER — ONDANSETRON 4 MG PO TBDP
ORAL_TABLET | ORAL | Status: AC
  Filled 2015-08-14: qty 1

## 2015-08-14 MED ORDER — OXYCODONE-ACETAMINOPHEN 5-325 MG PO TABS
1.0000 | ORAL_TABLET | Freq: Once | ORAL | Status: AC
  Administered 2015-08-14: 1 via ORAL

## 2015-08-14 MED ORDER — OXYCODONE-ACETAMINOPHEN 5-325 MG PO TABS
1.0000 | ORAL_TABLET | Freq: Once | ORAL | Status: AC
  Administered 2015-08-14: 1 via ORAL
  Filled 2015-08-14: qty 1

## 2015-08-14 MED ORDER — ONDANSETRON 4 MG PO TBDP
4.0000 mg | ORAL_TABLET | Freq: Once | ORAL | Status: AC | PRN
  Administered 2015-08-14: 4 mg via ORAL

## 2015-08-14 MED ORDER — OXYCODONE-ACETAMINOPHEN 5-325 MG PO TABS
ORAL_TABLET | ORAL | Status: AC
  Filled 2015-08-14: qty 1

## 2015-08-14 NOTE — ED Notes (Signed)
Pt reports hx of back surgery, "felt something pop" two days ago and now having severe pain and nausea.

## 2015-08-14 NOTE — ED Provider Notes (Signed)
CSN: [id number]     Arrival date & time 08/14/15  1159 History   First MD Initiated Contact with Patient 08/14/15 1409     Chief Complaint  Patient presents with  . Back Pain     (Consider location/radiation/quality/duration/timing/severity/associated sxs/prior Treatment) HPI  49 year old male who had an L2 fracture on 06/03/2015, subsequently had a lumbar fusion at T10-L4 to stabilize the fracture. This procedure was done by Dr. Marikay Alar. Since patient was discharged on 06/14/2015 he endorsed persistent chronic low back pain at the surgical site. Pain is described as a sharp stabbing achy sensation, constant, only resolution is when he sleep. Despite taking pain medication prescribed patient reports no adequate improvement of symptoms. 4 days ago as he stood up from a sitting position he felt a pop to his low back and having worsening back pain. No associated bowel bladder incontinence or saddle anesthesia. He did contact his neurosurgeon and was told to come to ER if condition worsened. Otherwise patient denies any fever, chills, dysuria, hematuria, radicular leg pain. No history of IV drug use and no active cancer. Pain is currently 10 out of 10. Patient uses a walker to walk at home. He also has a TLSO back brace which he used on a regular basis.    Past Medical History  Diagnosis Date  . 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 Alcohol abuse   . Tobacco abuse   . Nephrolithiasis 04/2011    Left ureteral stone  . Chronic pain syndrome     Narcotic seeking behavior with numerous requests for pain medications, IV dilaudid  . 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 Hypertension   . Angina   . Shortness of breath   . Coronary artery disease   . GERD (gastroesophageal reflux disease)   .  Headache(784.0)   . Anxiety   . Atrial fibrillation   . Gout   . Arthritis    Past Surgical History  Procedure Laterality Date  . Cardiac catheterization  07/01/2010  . Knee arthroplasty      left   Family History  Problem Relation Age of Onset  . Diabetes Mother     No known heart disease, didn't know his father  . Other Other     grandmother-hx of palpitations  . Coronary artery disease Mother    Social History  Substance Use Topics  . Smoking status: Current Every Day Smoker -- 0.25 packs/day for 20 years    Types: Cigarettes  . Smokeless tobacco: None     Comment: Previously 2 ppd, now 0.25 ppd  . Alcohol Use: 0.0 oz/week     Comment: States he has not drank since the ablation in 2011    Review of Systems  All other systems reviewed and are negative.     Allergies  Review of patient's allergies indicates no known allergies.  Home Medications   Prior to Admission medications   Medication Sig Start Date End Date Taking? Authorizing Provider  alprazolam Prudy Feeler) 2 MG tablet Take 2 mg by mouth 3 (three) times daily as needed (pain).    Historical Provider, MD  alprazolam Prudy Feeler) 2 MG tablet Take 2 mg by mouth 3 (three) times daily as needed for sleep or anxiety.    Historical Provider, MD  diazepam (VALIUM) 5 MG tablet Take 1 tablet (  5 mg total) by mouth every 6 (six) hours as needed for muscle spasms. 06/16/15   Tia Alert, MD  HYDROcodone-acetaminophen (NORCO) 10-325 MG per tablet Take 1 tablet by mouth every 6 (six) hours as needed for moderate pain or severe pain (pain).     Historical Provider, MD  HYDROmorphone (DILAUDID) 4 MG tablet Take 1 tablet (4 mg total) by mouth every 6 (six) hours as needed for severe pain. 06/16/15   Tia Alert, MD  ibuprofen (ADVIL,MOTRIN) 200 MG tablet Take 800 mg by mouth every 6 (six) hours as needed for moderate pain (pain).    Historical Provider, MD   BP 111/66 mmHg  Pulse 84  Temp(Src) 97.6 F (36.4 C) (Oral)  Resp 18   SpO2 98% Physical Exam  Constitutional: He appears well-developed and well-nourished. No distress.  HENT:  Head: Atraumatic.  Eyes: Conjunctivae are normal.  Neck: Neck supple.  Abdominal: Soft. There is no tenderness.  Musculoskeletal: He exhibits tenderness (Tenderness along lumbar spine midline at surgical site. Well-appearing surgical scar, no signs of infection.).  Decreased strength to bilateral lower extremities secondary to poor effort.   Neurological: He is alert.  Patellar deep tendon reflex 3+ bilaterally. No foot drop. Intact distal pulses.    Skin: No rash noted.  Psychiatric: He has a normal mood and affect.  Nursing note and vitals reviewed.   ED Course  Procedures (including critical care time)  Patient presents with persistent low back pain status post L-spine surgery a month ago. He has no red flags on exam. He is able to ambulate without assist. No bowel bladder incontinence as anesthesia. The site of the back surgery shows no evidence of infection. He is afebrile with stable normal vital sign. Pain medication provided in the ED, x-ray of his L spine obtained.  L-spine x-ray shows no acute finding. I instructed patient to follow-up with his neurosurgeon and possibly with pain management for further care of his condition. At this time a do not think narcotic pain medication as appropriate as patient has history of polysubstance abuse and alcohol abuse.   The likelihood of other entities in the differential is insufficient to justify any further testing for them.  This was explained to the patient.  The patient was advised that persistent or worsening symptoms would require further evaluation.       Labs Review Labs Reviewed - No data to display  Imaging Review Dg Lumbar Spine Complete  08/14/2015   CLINICAL DATA:  Pt reports hx of back surgery, "felt something pop" two days ago and now having severe pain and nausea. Pain all over lumbar region.  EXAM: LUMBAR  SPINE - COMPLETE 4+ VIEW  COMPARISON:  07/27/2015  FINDINGS: Status post posterior fusion from T12 through L4. Orthopedic hardware is well-seated. No evidence of loosening.  Chronic moderate compression fracture of L2 with a mild compression fracture of the upper endplate of L1, both stable. No new fractures.  No spondylolisthesis. Mild loss of disc height at L1-L2. Remaining disc spaces are well preserved as are the facet joints. Soft tissues are unremarkable.  IMPRESSION: 1. No acute fracture or acute finding. 2. No evidence of loosening or malalignment of the orthopedic hardware. No change the prior study.   Electronically Signed   By: Amie Portland M.D.   On: 08/14/2015 15:13   I have personally reviewed and evaluated these images and lab results as part of my medical decision-making.   EKG Interpretation None  MDM   Final diagnoses:  Midline low back pain without sciatica    BP 111/66 mmHg  Pulse 84  Temp(Src) 97.6 F (36.4 C) (Oral)  Resp 18  SpO2 98%  I have reviewed nursing notes and vital signs. I personally viewed the imaging tests through PACS system and agrees with radiologist's intepretation I reviewed available ER/hospitalization records through the EMR     Fayrene Helper, PA-C 08/14/15 1527  Gwyneth Sprout, MD 08/14/15 1626

## 2015-08-14 NOTE — ED Notes (Signed)
Pt asked this RN for assistance with accessing his Medicare.  This RN attempted to contact CSW.  Before she could get a response from CSW, pt no longer in room, wife states he left for the lobby.  Pt stated he will follow up with PCP.

## 2015-08-14 NOTE — Discharge Instructions (Signed)
Chronic Back Pain ° When back pain lasts longer than 3 months, it is called chronic back pain. People with chronic back pain often go through certain periods that are more intense (flare-ups).  °CAUSES °Chronic back pain can be caused by wear and tear (degeneration) on different structures in your back. These structures include: °· The bones of your spine (vertebrae) and the joints surrounding your spinal cord and nerve roots (facets). °· The strong, fibrous tissues that connect your vertebrae (ligaments). °Degeneration of these structures may result in pressure on your nerves. This can lead to constant pain. °HOME CARE INSTRUCTIONS °· Avoid bending, heavy lifting, prolonged sitting, and activities which make the problem worse. °· Take brief periods of rest throughout the day to reduce your pain. Lying down or standing usually is better than sitting while you are resting. °· Take over-the-counter or prescription medicines only as directed by your caregiver. °SEEK IMMEDIATE MEDICAL CARE IF:  °· You have weakness or numbness in one of your legs or feet. °· You have trouble controlling your bladder or bowels. °· You have nausea, vomiting, abdominal pain, shortness of breath, or fainting. °Document Released: 01/12/2005 Document Revised: 02/27/2012 Document Reviewed: 11/19/2011 °ExitCare® Patient Information ©2015 [employer]. This information is not intended to replace advice given to you by your health care provider. Make sure you discuss any questions you have with your health care provider. ° ° °Emergency Department Resource Guide °1) Find a Doctor and Pay Out of Pocket °Although you won't have to find out who is covered by your insurance plan, it is a good idea to ask around and get recommendations. You will then need to call the office and see if the doctor you have chosen will accept you as a new patient and what types of options they offer for patients who are self-pay. Some doctors offer discounts or will set  up payment plans for their patients who do not have insurance, but you will need to ask so you aren't surprised when you get to your appointment. ° °2) Contact Your Local Health Department °Not all health departments have doctors that can see patients for sick visits, but many do, so it is worth a call to see if yours does. If you don't know where your local health department is, you can check in your phone book. The CDC also has a tool to help you locate your state's health department, and many state websites also have listings of all of their local health departments. ° °3) Find a Walk-in Clinic °If your illness is not likely to be very severe or complicated, you may want to try a walk in clinic. These are popping up all over the country in pharmacies, drugstores, and shopping centers. They're usually staffed by nurse practitioners or physician assistants that have been trained to treat common illnesses and complaints. They're usually fairly quick and inexpensive. However, if you have serious medical issues or chronic medical problems, these are probably not your best option. ° °No Primary Care Doctor: °- Call [facility] at  832-8000 - they can help you locate a primary care doctor that  accepts your insurance, provides certain services, etc. °- Physician Referral Service- 1-800-533-3463 ° °Chronic Pain Problems: °Organization         Address  Phone   Notes  °[facility]  (336) 297-2271 Patients need to be referred by their primary care doctor.  ° °Medication Assistance: °Organization         Address    Phone   Notes  °[place] [facility] [address] °[place], [place] [address] (336) 641-8030 --Must be a resident of [place] °-- Must have NO insurance coverage whatsoever (no Medicaid/ Medicare, etc.) °-- The pt. MUST have a primary care doctor that directs their care regularly and follows them in the community °  °[facility]  (866) 331-1348    °[employer]  (888) 892-1162   ° °Agencies that provide inexpensive medical care: °Organization         Address  Phone   Notes  °[facility]  (336) 832-8035   °[facility]    (336) 832-7272   °[facility] [address] °[place], [place] [address] (336) 832-4777   °[facility] [address], °[place] (336) 271-4999   °[facility]    (336) 373-0678   °[facility]    (336) 272-1050   °[facility] ° [address], [place] Phone:  (336) 832-4444, Fax:  (336) 832-4440 Hours of Operation:  9 am - 6 pm, M-F.  Also accepts Medicaid/Medicare and self-pay.  °[facility] ° [address], [place] Phone: (336) 832-3150, Fax: (336) 832-3151. Hours of Operation:  8:30 am - 5:30 pm, M-F.  Also accepts Medicaid and self-pay.  °[facility] [place] [address], [place] Phone: (336) 878-6027   °[facility] [address], [place], [place] (336)723-1848, Ext. 123 Mondays & Thursdays: 7-9 AM.  First 15 patients are seen on a first come, first serve basis. °  ° °Medicaid-accepting [place] Providers: ° °Organization         Address  Phone   Notes  °[facility] [address], [place] (336) 641-2100 Also accepts self-pay patients.  °[facility] [address], [place] ° (336) 856-9996   °[facility] [address], [place] (336) 288-8857   °[facility] [address] [place] [address], [place] (336) 299-7000   °Veita Bland [address], [place]  ° (336) 373-1557 Only accepts [place] Access Medicaid patients after they have their name applied to their card.  ° °Self-Pay (no insurance) in [place]: ° °Organization         Address  Phone   Notes  °Sickle Cell Patients, [facility] [address],  [place] (336) 832-1970   °[facility] [address], [place] (336) 832-4400   ° [facility] ° [address], [place] (336) 992-4800   °[facility]/Dr. Osei-Bonsu ° [address] [place] [address], [place] or [address], [place] (336) [phone] Phone number for both [place] and [place] locations is the same.  °[facility] [address], [place] (336) 299-0000   °[facility]  [address] [place] [address],  or [address] (336) 852-7530 °(336) 878-2260   °[facility] [address],  (336) 350-1642, phone; (336) 294-5005, fax Sees patients 1st and 3rd Saturday of every month.  Must not qualify for public or private insurance (i.e. Medicaid, Medicare, [place] Health Choice, Veterans' Benefits) • Household income should be no more than 200% of the poverty level •The clinic cannot treat you if you are pregnant or think you are pregnant •   Sexually transmitted diseases are not treated at the clinic.  ° ° °Dental Care: °Organization         Address  Phone  Notes  °[facility] [address], [place] (336) 641-6152 Accepts children up to age 21 who are enrolled in Medicaid or [place] Health Choice; pregnant women with a Medicaid card; and children who have applied for Medicaid or [place] Health Choice, but were declined, whose parents can pay a reduced fee at time of service.  °[facility] [place]  [address], [place] (336) 641-7733 Accepts children up to age 21 who are enrolled in Medicaid or [place] Health Choice; pregnant women with a Medicaid card; and children who have applied for Medicaid or [place] Health Choice, but were declined, whose parents can pay a reduced fee at time of service.  °[facility] ° [address], [place] (336)  641-4533 Patients are seen by appointment only. Walk-ins are not accepted. [facility] will see patients 18 years of age and older. °Monday - Tuesday (8am-5pm) °Most Wednesdays (8:30-5pm) °$30 per visit, cash only  °[facility] ° [address], [place] (336) 641-4533 Patients are seen by appointment only. Walk-ins are not accepted. [facility] will see patients 18 years of age and older. °One Wednesday Evening (Monthly: Volunteer Based).  $30 per visit, cash only  °[facility]  (919) 537-3737 for adults; Children under age 4, call Graduate Pediatric Dentistry at ([phone]) [phone]. Children aged 4-14, please call (919) [phone] to request a pediatric application. ° Dental services are provided in all areas of dental care including fillings, crowns and bridges, complete and partial dentures, implants, gum treatment, root canals, and extractions. Preventive care is also provided. Treatment is provided to both adults and children. °Patients are selected via a lottery and there is often a waiting list. °  °[facility] [address], °[place] ° (336) 763-8833 [email or web address] °  °[facility] [address], [place], [place] (336)723-1848, Ext. 123 Second and Fourth Thursday of each month, opens at 6:30 AM; Clinic ends at 9 AM.  Patients are seen on a first-come first-served basis, and a limited number are seen during each clinic.  ° °[facility] ° [address], [place], [place] (336) 723-7904   Eligibility Requirements °You must have lived in [place], [place], or [place] counties for at least the last three months. °  You cannot be eligible for state or federal sponsored healthcare insurance, including Veterans Administration, Medicaid, or Medicare. °  You generally cannot be eligible for healthcare insurance through your employer.  °  How to apply: °Eligibility screenings are held every Tuesday and Wednesday afternoon  from 1:00 pm until 4:00 pm. You do not need an appointment for the interview!  °[facility] [address], [place], [place] 336-631-2330   °[facility]  336-342-8273   °[facility]  336-703-3100   °[facility]  336-570-6415   ° °Behavioral Health Resources in the Community: °Intensive Outpatient Programs °Organization         Address  Phone  Notes  °[facility] [address], [place], [place] 336-878-6098   °[facility] [address], [place], [place] 336-832-9800   °[facility] [address], [place], [place] °   336-882-2125   °[facility] [address],  °[place], [place] 1-800-853-5163 or 336-641-4981   °Substance Abuse Resources °Organization         Address  Phone  Notes  °[facility]  336-882-2125   °[facility]  336-784-9470   °[facility]  336-285-9073   °[facility]  336-845-3988   °Residential & Outpatient Substance Abuse Program  1-800-659-3381   °Psychological Services °Organization         Address  Phone  Notes  °[facility]  336- 832-9600   °[facility]  336- 378-7881   °[facility] [address], [place] 1-800-853-5163 or 336-641-4981   ° °Mobile Crisis Teams °Organization         Address  Phone  Notes  °[facility], [place] Crisis Care Unit  1-877-626-1772   °Assertive °[facility] ° [address] [place], [place] 336-834-9664   °Sharon DeEsch [address] °[place] 336-554-5454   ° °Self-Help/Support Groups °Organization         Address  Phone             Notes  °[facility] [place] - variety of support groups  336- 373-1402 Call for more information  °Narcotics Anonymous (NA), [facility] [address], °[place]  2 meetings at this location  ° °Residential Treatment  Programs °Organization         Address  Phone  Notes  °[facility] [address],    °[place]  1-866-801-8205   °[facility] ° [address], [place], [place] 704-293-8524   °[facility] [address], [place] 336-845-3988 Admissions: 8am-3pm M-F  °[facility] [address]    °[place], [place] 336-841-1104   °[facility] [address], [place], [place] 336-379-7146   °[facility] [address]  °[place], [place] 336-285-9073   °[facility] - Intensive Outpatient [address], [place], [place] 336-852-3033   °[facility] [address]  °[place], [place] 1-877-615-2722 or 336-784-9470   °[facility] [address] [place], [place] 336-227-7417 Accepts Medicaid  °[facility] [address]  °[place] 1-800-659-3381 Substance Abuse/Addiction Treatment  ° °[place] Behavioral Health Resources °Organization         Address  Phone  Notes  °[facility]  (888) 581-9988   °Julie Brannon, PhD [address] [place], [place]   (336) 349-5553 or (336) 951-0000   °[facility]   [address] °[place], [place] (336) 349-4454   °[facility] [address], [place], [place] (336) 342-8316 Insurance/Medicaid/sponsorship through [facility]  °[facility] [address]                                    [place], [place] (336) 342-8316 Therapy/tele-psych/case  °[facility] [address]  ° [place],  (336) 349-2233    °Dr. Arfeen  (336) 349-4544   °[facility] [place]  [employer] [place] [facility] 1) [address], [place] °2) [address], [place] °3)  [address], [place] (336) 349-3220 °(336) 342-7768 ° °(336) 342-8140   °[place] [facility] (336) 342-1394 or (336) 342-3537 (  After Hours)    ° ° ° °

## 2015-10-14 ENCOUNTER — Other Ambulatory Visit: Payer: Self-pay | Admitting: Neurological Surgery

## 2015-10-14 DIAGNOSIS — S32012A Unstable burst fracture of first lumbar vertebra, initial encounter for closed fracture: Secondary | ICD-10-CM

## 2015-10-31 ENCOUNTER — Ambulatory Visit
Admission: RE | Admit: 2015-10-31 | Discharge: 2015-10-31 | Disposition: A | Discharge status: Home / Self Care | Payer: Self-pay | Source: Ambulatory Visit | Attending: Neurological Surgery | Admitting: Neurological Surgery

## 2015-10-31 DIAGNOSIS — S32012A Unstable burst fracture of first lumbar vertebra, initial encounter for closed fracture: Secondary | ICD-10-CM

## 2018-01-25 ENCOUNTER — Other Ambulatory Visit: Payer: Self-pay

## 2018-01-25 DIAGNOSIS — Z79899 Other long term (current) drug therapy: Secondary | ICD-10-CM | POA: Diagnosis not present

## 2018-01-25 DIAGNOSIS — M545 Low back pain: Secondary | ICD-10-CM | POA: Insufficient documentation

## 2018-01-25 DIAGNOSIS — Y929 Unspecified place or not applicable: Secondary | ICD-10-CM | POA: Diagnosis not present

## 2018-01-25 DIAGNOSIS — S098XXA Other specified injuries of head, initial encounter: Secondary | ICD-10-CM | POA: Diagnosis present

## 2018-01-25 DIAGNOSIS — S0282XA Fracture of other specified skull and facial bones, left side, initial encounter for closed fracture: Secondary | ICD-10-CM | POA: Diagnosis not present

## 2018-01-25 DIAGNOSIS — Y939 Activity, unspecified: Secondary | ICD-10-CM | POA: Insufficient documentation

## 2018-01-25 DIAGNOSIS — Y999 Unspecified external cause status: Secondary | ICD-10-CM | POA: Insufficient documentation

## 2018-01-25 DIAGNOSIS — I10 Essential (primary) hypertension: Secondary | ICD-10-CM | POA: Diagnosis not present

## 2018-01-25 DIAGNOSIS — I2510 Atherosclerotic heart disease of native coronary artery without angina pectoris: Secondary | ICD-10-CM | POA: Diagnosis not present

## 2018-01-25 DIAGNOSIS — M542 Cervicalgia: Secondary | ICD-10-CM | POA: Insufficient documentation

## 2018-01-25 DIAGNOSIS — F17210 Nicotine dependence, cigarettes, uncomplicated: Secondary | ICD-10-CM | POA: Insufficient documentation

## 2018-01-25 NOTE — ED Notes (Signed)
Pt given a ice pack for face

## 2018-01-26 ENCOUNTER — Emergency Department (HOSPITAL_COMMUNITY)
Admission: EM | Admit: 2018-01-26 | Discharge: 2018-01-26 | Disposition: A | Discharge status: Home / Self Care | Payer: Medicaid Other | Attending: Emergency Medicine | Admitting: Emergency Medicine

## 2018-01-26 ENCOUNTER — Emergency Department (HOSPITAL_COMMUNITY): Payer: Medicaid Other

## 2018-01-26 ENCOUNTER — Encounter (HOSPITAL_COMMUNITY): Payer: Self-pay | Admitting: Emergency Medicine

## 2018-01-26 DIAGNOSIS — S0285XA Fracture of orbit, unspecified, initial encounter for closed fracture: Secondary | ICD-10-CM

## 2018-01-26 LAB — CBC WITH DIFFERENTIAL/PLATELET
Basophils Absolute: 0 10*3/uL (ref 0.0–0.1)
Basophils Relative: 0 %
Eosinophils Absolute: 0 10*3/uL (ref 0.0–0.7)
Eosinophils Relative: 0 %
HEMATOCRIT: 40.9 % (ref 39.0–52.0)
Hemoglobin: 14.2 g/dL (ref 13.0–17.0)
Lymphocytes Relative: 7 %
Lymphs Abs: 0.7 10*3/uL (ref 0.7–4.0)
MCH: 33.2 pg (ref 26.0–34.0)
MCHC: 34.7 g/dL (ref 30.0–36.0)
MCV: 95.6 fL (ref 78.0–100.0)
Monocytes Absolute: 0.5 10*3/uL (ref 0.1–1.0)
Monocytes Relative: 5 %
NEUTROS ABS: 7.8 10*3/uL — AB (ref 1.7–7.7)
Neutrophils Relative %: 88 %
Platelets: 201 10*3/uL (ref 150–400)
RBC: 4.28 MIL/uL (ref 4.22–5.81)
RDW: 12.6 % (ref 11.5–15.5)
WBC: 8.9 10*3/uL (ref 4.0–10.5)

## 2018-01-26 LAB — BASIC METABOLIC PANEL
Anion gap: 12 (ref 5–15)
BUN: 21 mg/dL — ABNORMAL HIGH (ref 6–20)
CALCIUM: 9 mg/dL (ref 8.9–10.3)
CO2: 21 mmol/L — AB (ref 22–32)
Chloride: 105 mmol/L (ref 101–111)
Creatinine, Ser: 1.28 mg/dL — ABNORMAL HIGH (ref 0.61–1.24)
GFR calc Af Amer: >60 mL/min (ref >60)
GFR calc non Af Amer: >60 mL/min (ref >60)
GLUCOSE: 130 mg/dL — AB (ref 65–99)
Potassium: 4.3 mmol/L (ref 3.5–5.1)
Sodium: 138 mmol/L (ref 135–145)

## 2018-01-26 MED ORDER — HYDROMORPHONE HCL 1 MG/ML IJ SOLN
0.5000 mg | Freq: Once | INTRAMUSCULAR | Status: AC
  Administered 2018-01-26: 0.5 mg via INTRAVENOUS
  Filled 2018-01-26: qty 1

## 2018-01-26 MED ORDER — IBUPROFEN 800 MG PO TABS: 800.0000 mg | ORAL_TABLET | Freq: Three times a day (TID) | ORAL | 0 refills | Status: DC

## 2018-01-26 MED ORDER — TRAMADOL HCL 50 MG PO TABS: 50.0000 mg | ORAL_TABLET | Freq: Four times a day (QID) | ORAL | 0 refills | Status: DC | PRN

## 2018-01-26 MED ORDER — TETRACAINE HCL 0.5 % OP SOLN
2.0000 [drp] | Freq: Once | OPHTHALMIC | Status: AC
  Administered 2018-01-26: 2 [drp] via OPHTHALMIC

## 2018-01-26 MED ORDER — ONDANSETRON 4 MG PO TBDP
8.0000 mg | ORAL_TABLET | Freq: Once | ORAL | Status: AC
  Administered 2018-01-26: 8 mg via ORAL
  Filled 2018-01-26: qty 2
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
## 2018-01-26 NOTE — ED Notes (Signed)
Patient transported to CT 

## 2018-01-26 NOTE — ED Notes (Signed)
Patient transported to X-ray 
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
## 2018-02-03 ENCOUNTER — Other Ambulatory Visit: Payer: Self-pay

## 2018-02-03 ENCOUNTER — Emergency Department (HOSPITAL_COMMUNITY)
Admission: EM | Admit: 2018-02-03 | Discharge: 2018-02-03 | Disposition: A | Discharge status: Home / Self Care | Payer: Medicaid Other | Attending: Emergency Medicine | Admitting: Emergency Medicine

## 2018-02-03 ENCOUNTER — Encounter (HOSPITAL_COMMUNITY): Payer: Self-pay | Admitting: *Deleted

## 2018-02-03 DIAGNOSIS — I2510 Atherosclerotic heart disease of native coronary artery without angina pectoris: Secondary | ICD-10-CM | POA: Diagnosis not present

## 2018-02-03 DIAGNOSIS — F17210 Nicotine dependence, cigarettes, uncomplicated: Secondary | ICD-10-CM | POA: Diagnosis not present

## 2018-02-03 DIAGNOSIS — I10 Essential (primary) hypertension: Secondary | ICD-10-CM | POA: Insufficient documentation

## 2018-02-03 DIAGNOSIS — S0282XD Fracture of other specified skull and facial bones, left side, subsequent encounter for fracture with routine healing: Secondary | ICD-10-CM | POA: Diagnosis present

## 2018-02-03 DIAGNOSIS — S0285XD Fracture of orbit, unspecified, subsequent encounter for fracture with routine healing: Secondary | ICD-10-CM

## 2018-02-03 DIAGNOSIS — S0280XD Fracture of other specified skull and facial bones, unspecified side, subsequent encounter for fracture with routine healing: Secondary | ICD-10-CM

## 2018-02-03 MED ORDER — IBUPROFEN 800 MG PO TABS: 800.0000 mg | ORAL_TABLET | Freq: Three times a day (TID) | ORAL | 0 refills | Status: DC

## 2018-02-03 MED ORDER — AMOXICILLIN-POT CLAVULANATE 875-125 MG PO TABS: 1.0000 | ORAL_TABLET | Freq: Two times a day (BID) | ORAL | 0 refills | Status: DC

## 2018-02-03 MED ORDER — TRAMADOL HCL 50 MG PO TABS: 50.0000 mg | ORAL_TABLET | Freq: Four times a day (QID) | ORAL | 0 refills | Status: DC | PRN
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
## 2018-07-08 ENCOUNTER — Emergency Department (HOSPITAL_COMMUNITY)
Admission: EM | Admit: 2018-07-08 | Discharge: 2018-07-08 | Discharge status: Against Medical Advice | Payer: Medicaid Other

## 2018-07-08 NOTE — ED Notes (Signed)
Pt not in waiting room

## 2018-07-08 NOTE — ED Notes (Signed)
No answer in waiting room 

## 2018-12-11 ENCOUNTER — Other Ambulatory Visit: Payer: Self-pay | Admitting: Nurse Practitioner

## 2018-12-11 DIAGNOSIS — G894 Chronic pain syndrome: Secondary | ICD-10-CM

## 2018-12-29 ENCOUNTER — Other Ambulatory Visit: Payer: Medicaid Other

## 2019-01-01 ENCOUNTER — Other Ambulatory Visit: Payer: Self-pay | Admitting: Nurse Practitioner

## 2019-01-05 ENCOUNTER — Ambulatory Visit
Admission: RE | Admit: 2019-01-05 | Discharge: 2019-01-05 | Disposition: A | Discharge status: Home / Self Care | Payer: Medicaid Other | Source: Ambulatory Visit | Attending: Nurse Practitioner | Admitting: Nurse Practitioner

## 2019-01-05 DIAGNOSIS — G894 Chronic pain syndrome: Secondary | ICD-10-CM

## 2019-03-12 ENCOUNTER — Other Ambulatory Visit: Payer: Medicaid Other

## 2019-06-01 ENCOUNTER — Ambulatory Visit
Admission: RE | Admit: 2019-06-01 | Discharge: 2019-06-01 | Disposition: A | Discharge status: Home / Self Care | Payer: Medicaid Other | Source: Ambulatory Visit | Attending: Nurse Practitioner | Admitting: Nurse Practitioner

## 2019-06-01 DIAGNOSIS — G894 Chronic pain syndrome: Secondary | ICD-10-CM

## 2019-06-01 MED ORDER — GADOBENATE DIMEGLUMINE 529 MG/ML IV SOLN
20.0000 mL | Freq: Once | INTRAVENOUS | Status: AC | PRN
  Administered 2019-06-01: 20 mL via INTRAVENOUS

## 2019-07-13 ENCOUNTER — Emergency Department (HOSPITAL_COMMUNITY)
Admission: EM | Admit: 2019-07-13 | Discharge: 2019-07-14 | Disposition: A | Discharge status: Home / Self Care | Payer: Medicaid Other | Attending: Emergency Medicine | Admitting: Emergency Medicine

## 2019-07-13 ENCOUNTER — Other Ambulatory Visit: Payer: Self-pay

## 2019-07-13 ENCOUNTER — Encounter (HOSPITAL_COMMUNITY): Payer: Self-pay

## 2019-07-13 DIAGNOSIS — Z79899 Other long term (current) drug therapy: Secondary | ICD-10-CM | POA: Insufficient documentation

## 2019-07-13 DIAGNOSIS — I2510 Atherosclerotic heart disease of native coronary artery without angina pectoris: Secondary | ICD-10-CM | POA: Insufficient documentation

## 2019-07-13 DIAGNOSIS — L089 Local infection of the skin and subcutaneous tissue, unspecified: Secondary | ICD-10-CM | POA: Insufficient documentation

## 2019-07-13 DIAGNOSIS — Z23 Encounter for immunization: Secondary | ICD-10-CM | POA: Insufficient documentation

## 2019-07-13 DIAGNOSIS — I10 Essential (primary) hypertension: Secondary | ICD-10-CM | POA: Insufficient documentation

## 2019-07-13 DIAGNOSIS — F17210 Nicotine dependence, cigarettes, uncomplicated: Secondary | ICD-10-CM | POA: Diagnosis not present

## 2019-07-13 DIAGNOSIS — M79644 Pain in right finger(s): Secondary | ICD-10-CM | POA: Diagnosis present

## 2019-07-13 NOTE — ED Triage Notes (Signed)
Pt arrives POV for eval of laceration to R middle finger, states he cut it on a piece of metal 3 days ago. Finger is warm and TTP. Last Tetanus UTD

## 2019-07-14 ENCOUNTER — Emergency Department (HOSPITAL_COMMUNITY): Payer: Medicaid Other

## 2019-07-14 MED ORDER — TETANUS-DIPHTH-ACELL PERTUSSIS 5-2.5-18.5 LF-MCG/0.5 IM SUSP
0.5000 mL | Freq: Once | INTRAMUSCULAR | Status: AC
  Administered 2019-07-14: 0.5 mL via INTRAMUSCULAR
  Filled 2019-07-14: qty 0.5

## 2019-07-14 MED ORDER — ACETAMINOPHEN 500 MG PO TABS: 1000.0000 mg | ORAL_TABLET | Freq: Three times a day (TID) | ORAL | 0 refills | Status: AC

## 2019-07-14 MED ORDER — LIDOCAINE HCL (PF) 1 % IJ SOLN
INTRAMUSCULAR | Status: AC
  Filled 2019-07-14: qty 5

## 2019-07-14 MED ORDER — SULFAMETHOXAZOLE-TRIMETHOPRIM 800-160 MG PO TABS: 1.0000 | ORAL_TABLET | Freq: Two times a day (BID) | ORAL | 0 refills | Status: AC

## 2019-07-14 MED ORDER — BUPIVACAINE HCL (PF) 0.5 % IJ SOLN
5.0000 mL | Freq: Once | INTRAMUSCULAR | Status: AC
  Administered 2019-07-14: 04:00:00 5 mL
  Filled 2019-07-14: qty 10

## 2019-07-14 MED ORDER — CEFTRIAXONE SODIUM 1 G IJ SOLR
1.0000 g | Freq: Once | INTRAMUSCULAR | Status: AC
  Administered 2019-07-14: 06:00:00 1 g via INTRAMUSCULAR
  Filled 2019-07-14: qty 10

## 2019-07-14 MED ORDER — ACETAMINOPHEN 500 MG PO TABS
1000.0000 mg | ORAL_TABLET | Freq: Once | ORAL | Status: AC
  Administered 2019-07-14: 1000 mg via ORAL
  Filled 2019-07-14: qty 2

## 2019-07-14 MED ORDER — KETOROLAC TROMETHAMINE 60 MG/2ML IM SOLN
30.0000 mg | Freq: Once | INTRAMUSCULAR | Status: AC
  Administered 2019-07-14: 30 mg via INTRAMUSCULAR
  Filled 2019-07-14: qty 2

## 2019-07-14 NOTE — ED Provider Notes (Signed)
[facility] EMERGENCY DEPARTMENT Provider Note  CSN: [id number] Arrival date & time: 07/13/19 2154  Chief Complaint(s) Finger Infection  HPI Bobby Horn is a 53 y.o. male here for right middle finger pain. Reports that he cut his finger on metal 6 days ago and tried to manage it at home. 3 days ago it began to swell and become painful. He tried to open the wound with nail clippers. Pain is throbbing and severe. Worse with movement and palpation. No alleviating factors. He also notes surrounding redness. No other complaints  HPI  Past Medical History Past Medical History:  Diagnosis Date  . Alcohol abuse   . Angina   . Anxiety   . Arthritis   . Atrial fibrillation ([place])   . Chronic pain syndrome    Narcotic seeking behavior with numerous requests for pain medications, IV dilaudid  . Coronary artery disease   . GERD (gastroesophageal reflux disease)   . 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 Gout   . Headache(784.0)   . Hypertension   . Hypertension   . Nephrolithiasis 04/2011   Left ureteral stone  . Shortness of breath   . 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 Tobacco abuse    Patient Active Problem List   Diagnosis Date Noted  . S/P lumbar fusion 06/10/2015  . Fracture of thumb 06/04/2015  . Lumbar vertebral fracture ([place]) 06/03/2015  . Fall 06/03/2015  . Chest pain, musculoskeletal 11/06/2011  . Chest pain 11/04/2011  . Hypertension 11/04/2011  . Chronic pain syndrome   . Supraventricular tachycardia ([place])   . Alcohol abuse   . Tobacco abuse   . Gout   . Nephrolithiasis 04/19/2011  . ALCOHOL ABUSE 06/17/2009   Home Medication(s) Prior to Admission medications   Medication Sig Start Date End Date Taking? Authorizing Provider  acetaminophen  (TYLENOL) 500 MG tablet Take 2 tablets (1,000 mg total) by mouth every 8 (eight) hours for 5 days. Do not take more than 4000 mg of acetaminophen (Tylenol) in a 24-hour period. Please note that other medicines that you may be prescribed may have Tylenol as well. 07/14/19 07/19/19  Fatima Blank, MD  amoxicillin-clavulanate (AUGMENTIN) 875-125 MG tablet Take 1 tablet by mouth every 12 (twelve) hours. 02/03/18   Fransico Meadow, PA-C  ibuprofen (ADVIL,MOTRIN) 800 MG tablet Take 1 tablet (800 mg total) by mouth 3 (three) times daily. 02/03/18   Fransico Meadow, PA-C  sulfamethoxazole-trimethoprim (BACTRIM DS) 800-160 MG tablet Take 1 tablet by mouth 2 (two) times daily for 7 days. 07/14/19 07/21/19  Fatima Blank, MD  traMADol (ULTRAM) 50 MG tablet Take 1 tablet (50 mg total) by mouth every 6 (six) hours as needed. 02/03/18   Fransico Meadow, PA-C  metoprolol (LOPRESSOR) 50 MG tablet Take 50 mg by mouth 2 (two) times daily. 11/06/11 03/13/12  Verlee Monte, MD  Past Surgical History Past Surgical History:  Procedure Laterality Date  . CARDIAC CATHETERIZATION  07/01/2010  . KNEE ARTHROPLASTY     left   Family History Family History  Problem Relation Age of Onset  . Diabetes Mother        No known heart disease, didn't know his father  . Coronary artery disease Mother   . Other Other        grandmother-hx of palpitations    Social History Social History   Tobacco Use  . Smoking status: Current Every Day Smoker    Packs/day: 0.25    Years: 20.00    Pack years: 5.00    Types: Cigarettes  . Smokeless tobacco: Never Used  . Tobacco comment: Previously 2 ppd, now 0.25 ppd  Substance Use Topics  . Alcohol use: Yes    Comment: States he has not drank since the ablation in 2011  . Drug use: No   Allergies Patient has no known allergies.  Review of  Systems Review of Systems All other systems are reviewed and are negative for acute change except as noted in the HPI  Physical Exam Vital Signs  I have reviewed the triage vital signs BP (!) 150/103 (BP Location: Right Arm)   Pulse 78   Temp 98.2 F (36.8 C) (Oral)   Resp 17   Ht 6\' 1"  (1.854 m)   Wt 102.1 kg   SpO2 99%   BMI 29.69 kg/m   Physical Exam Vitals signs reviewed.  Constitutional:      General: He is not in acute distress.    Appearance: He is well-developed. He is not diaphoretic.  HENT:     Head: Normocephalic and atraumatic.     Jaw: No trismus.     Right Ear: External ear normal.     Left Ear: External ear normal.     Nose: Nose normal.  Eyes:     General: No scleral icterus.    Conjunctiva/sclera: Conjunctivae normal.  Neck:     Musculoskeletal: Normal range of motion.     Trachea: Phonation normal.  Cardiovascular:     Rate and Rhythm: Normal rate and regular rhythm.  Pulmonary:     Effort: Pulmonary effort is normal. No respiratory distress.     Breath sounds: No stridor.  Abdominal:     General: There is no distension.  Musculoskeletal: Normal range of motion.       Hands:  Neurological:     Mental Status: He is alert and oriented to person, place, and time.  Psychiatric:        Behavior: Behavior normal.       ED Results and Treatments Labs (all labs ordered are listed, but only abnormal results are displayed) Labs Reviewed - No data to display                                                                                                                       EKG  EKG Interpretation  Date/Time:    Ventricular Rate:    PR Interval:    QRS Duration:   QT Interval:    QTC Calculation:   R Axis:     Text Interpretation:        Radiology Dg Hand Complete Right  Result Date: 07/14/2019 CLINICAL DATA:  53 year old male with laceration of the right middle finger. EXAM: RIGHT HAND - COMPLETE 3+ VIEW COMPARISON:  Right hand  radiograph dated 06/04/2015 FINDINGS: There is no acute fracture or dislocation. Mild arthritic changes of the MCP joints. No radiopaque foreign object or soft tissue gas. IMPRESSION: No acute fracture or dislocation. Electronically Signed   By: Elgie CollardArash  Radparvar M.D.   On: 07/14/2019 04:01    Pertinent labs & imaging results that were available during my care of the patient were reviewed by me and considered in my medical decision making (see chart for details).  Medications Ordered in ED Medications  Tdap (BOOSTRIX) injection 0.5 mL (0.5 mLs Intramuscular Given 07/14/19 0405)  bupivacaine (MARCAINE) 0.5 % injection 5 mL (5 mLs Infiltration Given by Other 07/14/19 0409)  acetaminophen (TYLENOL) tablet 1,000 mg (1,000 mg Oral Given 07/14/19 0410)  ketorolac (TORADOL) injection 30 mg (30 mg Intramuscular Given 07/14/19 0533)  cefTRIAXone (ROCEPHIN) injection 1 g (1 g Intramuscular Given 07/14/19 0548)                                                                                                                                    Procedures .Marland Kitchen.Incision and Drainage  Date/Time: 07/14/2019 5:59 PM Performed by: Nira Connardama, Nehal Witting Eduardo, MD Authorized by: Nira Connardama, Jencarlo Bonadonna Eduardo, MD   Consent:    Consent obtained:  Verbal   Consent given by:  Patient   Risks discussed:  Infection, incomplete drainage, pain and bleeding   Alternatives discussed:  Alternative treatment and referral Location:    Indications for incision and drainage: purulent wound infection.   Location:  Upper extremity   Upper extremity location:  Finger   Finger location:  R long finger Pre-procedure details:    Skin preparation:  Betadine Anesthesia (see MAR for exact dosages):    Anesthesia method:  Local infiltration and nerve block   Local anesthetic:  Bupivacaine 0.25% w/o epi   Block location:  Digital   Block needle gauge:  27 G   Block anesthetic:  Bupivacaine 0.25% w/o epi   Block injection procedure:  Anatomic  landmarks identified, introduced needle, negative aspiration for blood and anatomic landmarks palpated   Block outcome:  Incomplete block Procedure type:    Complexity:  Complex Procedure details:    Incision types:  Single straight (wound extended)   Incision depth:  Subcutaneous   Scalpel blade:  11   Wound management:  Probed and deloculated and irrigated with saline   Drainage:  Bloody and purulent   Drainage amount:  Moderate   Wound treatment:  Wound left open   Packing materials:  None Post-procedure details:    Patient tolerance of procedure:  Tolerated well, no immediate complications    (including critical care time)  Medical Decision Making / ED Course I have reviewed the nursing notes for this encounter and the patient's prior records (if available in EHR or on provided paperwork).   Bobby Horn was evaluated in Emergency Department on 07/14/2019 for the symptoms described in the history of present illness. He was evaluated in the context of the global COVID-19 pandemic, which necessitated consideration that the patient might be at risk for infection with the SARS-CoV-2 virus that causes COVID-19. Institutional protocols and algorithms that pertain to the evaluation of patients at risk for COVID-19 are in a state of rapid change based on information released by regulatory bodies including the CDC and federal and state organizations. These policies and algorithms were followed during the patient's care in the ED.  Infected wound. Plain film w/o FB. Wound extended with I&D, irrigated. Tetanus updated. Abx given and Rx'd. Hand surgery follow up.      Final Clinical Impression(s) / ED Diagnoses Final diagnoses:  Finger infection     The patient appears reasonably screened and/or stabilized for discharge and I doubt any other medical condition or other [facility] requiring further screening, evaluation, or treatment in the ED at this time prior to discharge.  Disposition:  Discharge  Condition: Good  I have discussed the results, Dx and Tx plan with the patient who expressed understanding and agree(s) with the plan. Discharge instructions discussed at great length. The patient was given strict return precautions who verbalized understanding of the instructions. No further questions at time of discharge.    ED Discharge Orders         Ordered    acetaminophen (TYLENOL) 500 MG tablet  Every 8 hours     07/14/19 0610    sulfamethoxazole-trimethoprim (BACTRIM DS) 800-160 MG tablet  2 times daily     07/14/19 [id number]            Follow Up: Ernest Mallickreighton, James J III, MD [address] [place] [id number] [phone]  Schedule an appointment as soon as possible for a visit  in 3-5 days, For close follow up to assess for finger infection     This chart was dictated using voice recognition software.  Despite best efforts to proofread,  errors can occur which can change the documentation meaning.   Nira Connardama, Yvone Slape Eduardo, MD 07/14/19 [phone]

## 2019-09-16 ENCOUNTER — Other Ambulatory Visit: Payer: Self-pay

## 2019-09-16 ENCOUNTER — Encounter (HOSPITAL_COMMUNITY): Payer: Self-pay

## 2019-09-16 ENCOUNTER — Emergency Department (HOSPITAL_COMMUNITY): Payer: Medicaid Other

## 2019-09-16 ENCOUNTER — Emergency Department (HOSPITAL_COMMUNITY)
Admission: EM | Admit: 2019-09-16 | Discharge: 2019-09-16 | Disposition: A | Discharge status: Home / Self Care | Payer: Medicaid Other | Attending: Emergency Medicine | Admitting: Emergency Medicine

## 2019-09-16 DIAGNOSIS — Z791 Long term (current) use of non-steroidal anti-inflammatories (NSAID): Secondary | ICD-10-CM | POA: Diagnosis not present

## 2019-09-16 DIAGNOSIS — F17210 Nicotine dependence, cigarettes, uncomplicated: Secondary | ICD-10-CM | POA: Diagnosis not present

## 2019-09-16 DIAGNOSIS — I10 Essential (primary) hypertension: Secondary | ICD-10-CM | POA: Insufficient documentation

## 2019-09-16 DIAGNOSIS — M25562 Pain in left knee: Secondary | ICD-10-CM | POA: Diagnosis present

## 2019-09-16 DIAGNOSIS — Z79899 Other long term (current) drug therapy: Secondary | ICD-10-CM | POA: Insufficient documentation

## 2019-09-16 DIAGNOSIS — M25462 Effusion, left knee: Secondary | ICD-10-CM

## 2019-09-16 MED ORDER — NAPROXEN 250 MG PO TABS
500.0000 mg | ORAL_TABLET | Freq: Once | ORAL | Status: AC
  Administered 2019-09-16: 21:00:00 500 mg via ORAL
  Filled 2019-09-16: qty 2

## 2019-09-16 MED ORDER — ETODOLAC 300 MG PO CAPS: 300.0000 mg | ORAL_CAPSULE | Freq: Three times a day (TID) | ORAL | 0 refills | Status: DC

## 2019-09-16 NOTE — Discharge Instructions (Signed)
Use the crutches and knee immobilizer, follow-up with an orthopedic doctor for further evaluation.

## 2019-09-16 NOTE — ED Triage Notes (Signed)
Pt reports falling 3 days ago, injuring his left knee, states he heard a pop. Significant swelling noted. Pt ambulatory

## 2019-09-16 NOTE — Progress Notes (Signed)
Orthopedic Tech Progress Note Patient Details:  Bobby Horn April 05, 1966 [id number]  Ortho Devices Type of Ortho Device: Crutches, Knee Immobilizer Ortho Device/Splint Location: lle Ortho Device/Splint Interventions: Ordered, Application   Post Interventions Patient Tolerated: Well Instructions Provided: Care of device, Adjustment of device   [name] 09/16/2019, 10:42 PM

## 2019-09-16 NOTE — ED Notes (Signed)
No answer for vitals recheck x1 

## 2019-09-16 NOTE — ED Provider Notes (Signed)
[facility] EMERGENCY DEPARTMENT Provider Note   CSN: [id number] Arrival date & time: 09/16/19  1639     History   Chief Complaint Chief Complaint  Patient presents with  . Knee Pain    HPI Bobby Horn is a 53 y.o. male.     HPI Presents to the emergency room for evaluation of knee pain.  Patient was walking 3 days ago when he twisted his knee and heard a pop.  At that time the patient has developed significant swelling.  Patient is concerned that he may have broken something or tore 1 of the ligaments.  He denies any fevers or chills.  No other injuries. Past Medical History:  Diagnosis Date  . Alcohol abuse   . Angina   . Anxiety   . Arthritis   . Atrial fibrillation (HCC)   . Chronic pain syndrome    Narcotic seeking behavior with numerous requests for pain medications, IV dilaudid  . Coronary artery disease   . GERD (gastroesophageal reflux disease)   . 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 Gout   . Headache(784.0)   . Hypertension   . Hypertension   . Nephrolithiasis 04/2011   Left ureteral stone  . Shortness of breath   . 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 Tobacco abuse     Patient Active Problem List   Diagnosis Date Noted  . S/P lumbar fusion 06/10/2015  . Fracture of thumb 06/04/2015  . Lumbar vertebral fracture (HCC) 06/03/2015  . Fall 06/03/2015  . Chest pain, musculoskeletal 11/06/2011  . Chest pain 11/04/2011  . Hypertension 11/04/2011  . Chronic pain syndrome   . Supraventricular tachycardia (HCC)   . Alcohol abuse   . Tobacco abuse   . Gout   . Nephrolithiasis 04/19/2011  . ALCOHOL ABUSE 06/17/2009    Past Surgical History:  Procedure Laterality Date  . CARDIAC CATHETERIZATION  07/01/2010  . KNEE ARTHROPLASTY     left         Home Medications    Prior to Admission medications   Medication Sig Start Date End Date Taking? Authorizing Provider  amoxicillin-clavulanate (AUGMENTIN) 875-125 MG tablet Take 1 tablet by mouth every 12 (twelve) hours. 02/03/18   Elson Areas, PA-C  etodolac (LODINE) 300 MG capsule Take 1 capsule (300 mg total) by mouth every 8 (eight) hours. 09/16/19   Linwood Dibbles, MD  ibuprofen (ADVIL,MOTRIN) 800 MG tablet Take 1 tablet (800 mg total) by mouth 3 (three) times daily. 02/03/18   Elson Areas, PA-C  traMADol (ULTRAM) 50 MG tablet Take 1 tablet (50 mg total) by mouth every 6 (six) hours as needed. 02/03/18   Elson Areas, PA-C  metoprolol (LOPRESSOR) 50 MG tablet Take 50 mg by mouth 2 (two) times daily. 11/06/11 03/13/12  Clydia Llano, MD    Family History Family History  Problem Relation Age of Onset  . Diabetes Mother        No known heart disease, didn't know his father  . Coronary artery disease Mother   . Other Other        grandmother-hx of palpitations    Social History Social History   Tobacco Use  . Smoking status: Current Every Day Smoker    Packs/day: 0.25    Years: 20.00    Pack  years: 5.00    Types: Cigarettes  . Smokeless tobacco: Never Used  . Tobacco comment: Previously 2 ppd, now 0.25 ppd  Substance Use Topics  . Alcohol use: Yes    Comment: States he has not drank since the ablation in 2011  . Drug use: No     Allergies   Patient has no known allergies.   Review of Systems Review of Systems  All other systems reviewed and are negative.    Physical Exam Updated Vital Signs BP (!) 146/89   Pulse 75   Temp 99.7 F (37.6 C) (Oral)   Resp 18   SpO2 100%   Physical Exam Vitals signs and nursing note reviewed.  Constitutional:      General: He is not in acute distress.    Appearance: He is well-developed.  HENT:     Head: Normocephalic and atraumatic.     Right Ear: External ear normal.     Left Ear: External ear normal.   Eyes:     General: No scleral icterus.       Right eye: No discharge.        Left eye: No discharge.     Conjunctiva/sclera: Conjunctivae normal.  Neck:     Musculoskeletal: Neck supple.     Trachea: No tracheal deviation.  Cardiovascular:     Rate and Rhythm: Normal rate.  Pulmonary:     Effort: Pulmonary effort is normal. No respiratory distress.     Breath sounds: No stridor.  Abdominal:     General: There is no distension.  Musculoskeletal:        General: No swelling or deformity.     Left knee: He exhibits swelling and effusion. He exhibits no erythema. Tenderness found.  Skin:    General: Skin is warm and dry.     Findings: No rash.  Neurological:     Mental Status: He is alert.     Cranial Nerves: Cranial nerve deficit: no gross deficits.      ED Treatments / Results  Labs (all labs ordered are listed, but only abnormal results are displayed) Labs Reviewed - No data to display  EKG None  Radiology Dg Knee Complete 4 Views Left  Result Date: 09/16/2019 CLINICAL DATA:  Fall EXAM: LEFT KNEE - COMPLETE 4+ VIEW COMPARISON:  04/12/2012 FINDINGS: No fracture or malalignment. Mild to moderate degenerative change of the medial and patellofemoral compartments. Large knee effusion. Sclerosis and articular depression at the medial tibial plateau is chronic. IMPRESSION: 1. No acute osseous abnormality. 2. Arthritis.  Large knee effusion Electronically Signed   By: Jasmine PangKim  Fujinaga M.D.   On: 09/16/2019 18:44    Procedures Procedures (including critical care time)  Medications Ordered in ED Medications - No data to display   Initial Impression / Assessment and Plan / ED Course  I have reviewed the triage vital signs and the nursing notes.  Pertinent labs & imaging results that were available during my care of the patient were reviewed by me and considered in my medical decision making (see chart for details).      Patient's x-rays show a large knee effusion.  He also  has chronic evidence of articular depression of the medial plateau.  Patient is not having any fevers.  There is no increased warmth.  I doubt infection.  I doubt gout.  I suspect patient's effusion is posttraumatic.  This may be associated with arthritis and a loose body versus some type of ligamentous injury.  Discussed immobilization and crutches.   Discussed arthrocentesis with pt in order to draw off fluid if not improving.  Pt not interested in that at this time.  Dc ortho follow up  Final Clinical Impressions(s) / ED Diagnoses   Final diagnoses:  Effusion of left knee    ED Discharge Orders         Ordered    etodolac (LODINE) 300 MG capsule  Every 8 hours    Note to Pharmacy: As needed for pain   09/16/19 2053           Dorie Rank, MD 09/16/19 2055

## 2020-02-26 ENCOUNTER — Emergency Department (HOSPITAL_COMMUNITY)
Admission: EM | Admit: 2020-02-26 | Discharge: 2020-02-27 | Disposition: A | Discharge status: Home / Self Care | Payer: Medicaid Other | Attending: Emergency Medicine | Admitting: Emergency Medicine

## 2020-02-26 ENCOUNTER — Encounter (HOSPITAL_COMMUNITY): Payer: Self-pay | Admitting: Emergency Medicine

## 2020-02-26 ENCOUNTER — Other Ambulatory Visit: Payer: Self-pay

## 2020-02-26 DIAGNOSIS — Z79899 Other long term (current) drug therapy: Secondary | ICD-10-CM | POA: Insufficient documentation

## 2020-02-26 DIAGNOSIS — L539 Erythematous condition, unspecified: Secondary | ICD-10-CM | POA: Diagnosis present

## 2020-02-26 DIAGNOSIS — I2510 Atherosclerotic heart disease of native coronary artery without angina pectoris: Secondary | ICD-10-CM | POA: Diagnosis not present

## 2020-02-26 DIAGNOSIS — L03211 Cellulitis of face: Secondary | ICD-10-CM | POA: Diagnosis not present

## 2020-02-26 DIAGNOSIS — I10 Essential (primary) hypertension: Secondary | ICD-10-CM | POA: Diagnosis not present

## 2020-02-26 DIAGNOSIS — I4891 Unspecified atrial fibrillation: Secondary | ICD-10-CM | POA: Diagnosis not present

## 2020-02-26 DIAGNOSIS — F17210 Nicotine dependence, cigarettes, uncomplicated: Secondary | ICD-10-CM | POA: Insufficient documentation

## 2020-02-26 NOTE — ED Triage Notes (Signed)
Patient reports skin abscess at right face onset this week from an infected pimple that he squeezed with drainage , no oral swelling /airway intact , denies fever or chills .

## 2020-02-27 ENCOUNTER — Encounter (HOSPITAL_COMMUNITY): Payer: Self-pay | Admitting: Radiology

## 2020-02-27 ENCOUNTER — Emergency Department (HOSPITAL_COMMUNITY): Payer: Medicaid Other

## 2020-02-27 LAB — CBC WITH DIFFERENTIAL/PLATELET
Abs Immature Granulocytes: 0.02 10*3/uL (ref 0.00–0.07)
Basophils Absolute: 0 10*3/uL (ref 0.0–0.1)
Basophils Relative: 1 %
Eosinophils Absolute: 0.1 10*3/uL (ref 0.0–0.5)
Eosinophils Relative: 2 %
HCT: 38.4 % — ABNORMAL LOW (ref 39.0–52.0)
Hemoglobin: 13 g/dL (ref 13.0–17.0)
Immature Granulocytes: 0 %
Lymphocytes Relative: 19 %
Lymphs Abs: 1.1 10*3/uL (ref 0.7–4.0)
MCH: 32.3 pg (ref 26.0–34.0)
MCHC: 33.9 g/dL (ref 30.0–36.0)
MCV: 95.3 fL (ref 80.0–100.0)
Monocytes Absolute: 0.4 10*3/uL (ref 0.1–1.0)
Monocytes Relative: 8 %
Neutro Abs: 4.2 10*3/uL (ref 1.7–7.7)
Neutrophils Relative %: 70 %
Platelets: 183 10*3/uL (ref 150–400)
RBC: 4.03 MIL/uL — ABNORMAL LOW (ref 4.22–5.81)
RDW: 12.2 % (ref 11.5–15.5)
WBC: 5.9 10*3/uL (ref 4.0–10.5)
nRBC: 0 % (ref 0.0–0.2)

## 2020-02-27 LAB — BASIC METABOLIC PANEL
Anion gap: 11 (ref 5–15)
BUN: 13 mg/dL (ref 6–20)
CO2: 25 mmol/L (ref 22–32)
Calcium: 8.7 mg/dL — ABNORMAL LOW (ref 8.9–10.3)
Chloride: 101 mmol/L (ref 98–111)
Creatinine, Ser: 0.87 mg/dL (ref 0.61–1.24)
GFR calc Af Amer: >60 mL/min (ref >60)
GFR calc non Af Amer: >60 mL/min (ref >60)
Glucose, Bld: 99 mg/dL (ref 70–99)
Potassium: 3.3 mmol/L — ABNORMAL LOW (ref 3.5–5.1)
Sodium: 137 mmol/L (ref 135–145)

## 2020-02-27 LAB — LACTIC ACID, PLASMA: Lactic Acid, Venous: 0.7 mmol/L (ref 0.5–1.9)

## 2020-02-27 MED ORDER — CLINDAMYCIN HCL 150 MG PO CAPS: 300.0000 mg | ORAL_CAPSULE | Freq: Four times a day (QID) | ORAL | 0 refills | Status: DC

## 2020-02-27 MED ORDER — CLINDAMYCIN PHOSPHATE 900 MG/50ML IV SOLN
900.0000 mg | Freq: Once | INTRAVENOUS | Status: AC
  Administered 2020-02-27: 900 mg via INTRAVENOUS
  Filled 2020-02-27: qty 50

## 2020-02-27 MED ORDER — MORPHINE SULFATE (PF) 4 MG/ML IV SOLN
4.0000 mg | Freq: Once | INTRAVENOUS | Status: AC
  Administered 2020-02-27: 4 mg via INTRAVENOUS
  Filled 2020-02-27: qty 1

## 2020-02-27 MED ORDER — ONDANSETRON HCL 4 MG/2ML IJ SOLN
4.0000 mg | Freq: Once | INTRAMUSCULAR | Status: AC
  Administered 2020-02-27: 03:00:00 4 mg via INTRAVENOUS
  Filled 2020-02-27: qty 2

## 2020-02-27 MED ORDER — IOHEXOL 300 MG/ML  SOLN
75.0000 mL | Freq: Once | INTRAMUSCULAR | Status: AC | PRN
  Administered 2020-02-27: 75 mL via INTRAVENOUS

## 2020-02-27 NOTE — ED Provider Notes (Signed)
[facility] EMERGENCY DEPARTMENT Provider Note   CSN: [id number] Arrival date & time: 02/26/20  2226     History Chief Complaint  Patient presents with  . Abscess    Bobby Horn is a 54 y.o. male.  Patient presents to the emergency department for evaluation of facial swelling.  Patient reports that earlier in the week he had a small bump on his cheek that he tried to squeeze.  There was a small amount of drainage at that time.  Since then he has had progressive swelling and redness of the right side of his face.        Past Medical History:  Diagnosis Date  . Alcohol abuse   . Angina   . Anxiety   . Arthritis   . Atrial fibrillation (HCC)   . Chronic pain syndrome    Narcotic seeking behavior with numerous requests for pain medications, IV dilaudid  . Coronary artery disease   . GERD (gastroesophageal reflux disease)   . 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 Gout   . Headache(784.0)   . Hypertension   . Hypertension   . Nephrolithiasis 04/2011   Left ureteral stone  . Shortness of breath   . 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 Tobacco abuse     Patient Active Problem List   Diagnosis Date Noted  . S/P lumbar fusion 06/10/2015  . Fracture of thumb 06/04/2015  . Lumbar vertebral fracture (HCC) 06/03/2015  . Fall 06/03/2015  . Chest pain, musculoskeletal 11/06/2011  . Chest pain 11/04/2011  . Hypertension 11/04/2011  . Chronic pain syndrome   . Supraventricular tachycardia (HCC)   . Alcohol abuse   . Tobacco abuse   . Gout   . Nephrolithiasis 04/19/2011  . ALCOHOL ABUSE 06/17/2009    Past Surgical History:  Procedure Laterality Date  . CARDIAC CATHETERIZATION  07/01/2010  . KNEE ARTHROPLASTY     left       Family History  Problem  Relation Age of Onset  . Diabetes Mother        No known heart disease, didn't know his father  . Coronary artery disease Mother   . Other Other        grandmother-hx of palpitations    Social History   Tobacco Use  . Smoking status: Current Every Day Smoker    Packs/day: 0.25    Years: 20.00    Pack years: 5.00    Types: Cigarettes  . Smokeless tobacco: Never Used  . Tobacco comment: Previously 2 ppd, now 0.25 ppd  Substance Use Topics  . Alcohol use: Yes    Comment: States he has not drank since the ablation in 2011  . Drug use: No    Home Medications Prior to Admission medications   Medication Sig Start Date End Date Taking? Authorizing Provider  amoxicillin-clavulanate (AUGMENTIN) 875-125 MG tablet Take 1 tablet by mouth every 12 (twelve) hours. 02/03/18   Elson Areas, PA-C  clindamycin (CLEOCIN) 150 MG capsule Take 2 capsules (300 mg total) by mouth 4 (four) times daily. 02/27/20   Gilda Crease, MD  etodolac (LODINE) 300 MG capsule Take 1 capsule (300 mg total) by mouth every 8 (eight) hours. 09/16/19   Linwood Dibbles, MD  ibuprofen (ADVIL,MOTRIN) 800 MG tablet Take 1 tablet (800 mg  total) by mouth 3 (three) times daily. 02/03/18   Fransico Meadow, PA-C  traMADol (ULTRAM) 50 MG tablet Take 1 tablet (50 mg total) by mouth every 6 (six) hours as needed. 02/03/18   Fransico Meadow, PA-C  metoprolol (LOPRESSOR) 50 MG tablet Take 50 mg by mouth 2 (two) times daily. 11/06/11 03/13/12  Verlee Monte, MD    Allergies    Patient has no known allergies.  Review of Systems   Review of Systems  HENT: Positive for facial swelling.   Skin: Positive for color change.  All other systems reviewed and are negative.   Physical Exam Updated Vital Signs BP 136/88   Pulse 80   Temp 98.1 F (36.7 C) (Oral)   Resp 15   Ht 6\' 1"  (1.854 m)   Wt 100 kg   SpO2 100%   BMI 29.09 kg/m   Physical Exam Vitals and nursing note reviewed.  Constitutional:      General: He is not in  acute distress.    Appearance: Normal appearance. He is well-developed.  HENT:     Head: Normocephalic and atraumatic.     Comments: Significant and diffuse swelling of the right side of the face with overlying erythema, no induration or fluctuance    Right Ear: Hearing normal.     Left Ear: Hearing normal.     Nose: Nose normal.  Eyes:     Conjunctiva/sclera: Conjunctivae normal.     Pupils: Pupils are equal, round, and reactive to light.  Cardiovascular:     Rate and Rhythm: Regular rhythm.     Heart sounds: S1 normal and S2 normal. No murmur. No friction rub. No gallop.   Pulmonary:     Effort: Pulmonary effort is normal. No respiratory distress.     Breath sounds: Normal breath sounds.  Chest:     Chest wall: No tenderness.  Abdominal:     General: Bowel sounds are normal.     Palpations: Abdomen is soft.     Tenderness: There is no abdominal tenderness. There is no guarding or rebound. Negative signs include Murphy's sign and McBurney's sign.     Hernia: No hernia is present.  Musculoskeletal:        General: Normal range of motion.     Cervical back: Normal range of motion and neck supple.  Skin:    General: Skin is warm and dry.     Findings: No rash.  Neurological:     Mental Status: He is alert and oriented to person, place, and time.     GCS: GCS eye subscore is 4. GCS verbal subscore is 5. GCS motor subscore is 6.     Cranial Nerves: No cranial nerve deficit.     Sensory: No sensory deficit.     Coordination: Coordination normal.  Psychiatric:        Speech: Speech normal.        Behavior: Behavior normal.        Thought Content: Thought content normal.     ED Results / Procedures / Treatments   Labs (all labs ordered are listed, but only abnormal results are displayed) Labs Reviewed  CBC WITH DIFFERENTIAL/PLATELET - Abnormal; Notable for the following components:      Result Value   RBC 4.03 (*)    HCT 38.4 (*)    All other components within normal  limits  BASIC METABOLIC PANEL - Abnormal; Notable for the following components:   Potassium 3.3 (*)  Calcium 8.7 (*)    All other components within normal limits  LACTIC ACID, PLASMA    EKG None  Radiology CT Maxillofacial W Contrast  Result Date: 02/27/2020 CLINICAL DATA:  Pain and redness to the right cheek for 1 week EXAM: CT MAXILLOFACIAL WITH CONTRAST TECHNIQUE: Multidetector CT imaging of the maxillofacial structures was performed with intravenous contrast. Multiplanar CT image reconstructions were also generated. CONTRAST:  96mL OMNIPAQUE IOHEXOL 300 MG/ML  SOLN COMPARISON:  01/26/2018 FINDINGS: Osseous: Diffuse dental caries with with periapical erosion primarily affecting the remaining right lower molar where there is also osteitis. There is prominent swelling in the upper lip and anterior maxilla, likely odontogenic. There is no discrete rim enhancing fluid collection - low-density anterior to the bilateral maxilla is symmetric and not clearly organized, most likely asymmetric fascial plane. Orbits: No postseptal inflammation. Remote blowout fracture of the medial wall left orbit. Sinuses: Mild patchy mucosal thickening.  No primary sinusitis. Soft tissues: Cellulitic changes to the right face as noted above. Limited intracranial: Negative IMPRESSION: Right face cellulitis, likely odontogenic given the degree of dental disease. A specific offending tooth is not identified and there is no organized subperiosteal collection. Electronically Signed   By: Marnee Spring M.D.   On: 02/27/2020 05:34    Procedures Procedures (including critical care time)  Medications Ordered in ED Medications  clindamycin (CLEOCIN) IVPB 900 mg (has no administration in time range)  morphine 4 MG/ML injection 4 mg (4 mg Intravenous Given 02/27/20 0316)  ondansetron (ZOFRAN) injection 4 mg (4 mg Intravenous Given 02/27/20 0316)  morphine 4 MG/ML injection 4 mg (4 mg Intravenous Given 02/27/20 0454)    iohexol (OMNIPAQUE) 300 MG/ML solution 75 mL (75 mLs Intravenous Contrast Given 02/27/20 0453)    ED Course  I have reviewed the triage vital signs and the nursing notes.  Pertinent labs & imaging results that were available during my care of the patient were reviewed by me and considered in my medical decision making (see chart for details).    MDM Rules/Calculators/A&P                      Patient presents to the emergency department for evaluation of facial pain and swelling.  Patient reports that he had a small bump on his cheek that he squeezed approximately a week ago.  Since then he has had progressively worsening swelling and redness.  He reports that he has kept trying to squeeze the area because he thought there was more to get out.  It appears that he has caused himself to have a fairly extensive facial cellulitis.  He does not have any dental complaints.  Examination does reveal widespread dental decay but there is no gingival swelling, no intraoral swelling, no tenderness with percussion on any of the teeth.  Lab work is normal.  Patient underwent CT maxillofacial with IV contrast.  He has extensive cellulitis but no abscess.  Patient initiated on clindamycin to cover for possible odontogenic infection as well as simple cellulitis.  I recommended admission to the patient.  He does not want to be admitted at this time.  He understands that IV antibiotics would be superior and he could worsen.  I explained to him the possibility of becoming septic and even experiencing life-threatening infection and death.  He understands this but does not want to be in the hospital at this time.  He wants oral medication and reports that he will return if symptoms worsen.  Final Clinical Impression(s) / ED Diagnoses Final diagnoses:  Facial cellulitis    Rx / DC Orders ED Discharge Orders         Ordered    clindamycin (CLEOCIN) 150 MG capsule  4 times daily     02/27/20 0548            Gilda Crease, MD 02/27/20 [phone]

## 2020-02-27 NOTE — ED Notes (Signed)
Patient verbalizes understanding of discharge instructions. Opportunity for questioning and answers were provided. Armband removed by staff, pt discharged from ED.  

## 2021-01-21 ENCOUNTER — Encounter (HOSPITAL_COMMUNITY): Payer: Self-pay | Admitting: *Deleted

## 2021-01-21 ENCOUNTER — Emergency Department (HOSPITAL_COMMUNITY)
Admission: EM | Admit: 2021-01-21 | Discharge: 2021-01-21 | Disposition: A | Discharge status: Home / Self Care | Payer: Medicaid Other | Attending: Emergency Medicine | Admitting: Emergency Medicine

## 2021-01-21 ENCOUNTER — Emergency Department (HOSPITAL_COMMUNITY): Payer: Medicaid Other

## 2021-01-21 ENCOUNTER — Other Ambulatory Visit: Payer: Self-pay

## 2021-01-21 DIAGNOSIS — I25119 Atherosclerotic heart disease of native coronary artery with unspecified angina pectoris: Secondary | ICD-10-CM | POA: Insufficient documentation

## 2021-01-21 DIAGNOSIS — F17210 Nicotine dependence, cigarettes, uncomplicated: Secondary | ICD-10-CM | POA: Insufficient documentation

## 2021-01-21 DIAGNOSIS — K047 Periapical abscess without sinus: Secondary | ICD-10-CM | POA: Insufficient documentation

## 2021-01-21 DIAGNOSIS — L03211 Cellulitis of face: Secondary | ICD-10-CM | POA: Insufficient documentation

## 2021-01-21 DIAGNOSIS — R220 Localized swelling, mass and lump, head: Secondary | ICD-10-CM | POA: Diagnosis present

## 2021-01-21 DIAGNOSIS — Z96652 Presence of left artificial knee joint: Secondary | ICD-10-CM | POA: Insufficient documentation

## 2021-01-21 DIAGNOSIS — I10 Essential (primary) hypertension: Secondary | ICD-10-CM | POA: Insufficient documentation

## 2021-01-21 LAB — BASIC METABOLIC PANEL
Anion gap: 9 (ref 5–15)
BUN: 19 mg/dL (ref 6–20)
CO2: 23 mmol/L (ref 22–32)
Calcium: 8.5 mg/dL — ABNORMAL LOW (ref 8.9–10.3)
Chloride: 103 mmol/L (ref 98–111)
Creatinine, Ser: 0.99 mg/dL (ref 0.61–1.24)
GFR, Estimated: >60 mL/min (ref >60)
Glucose, Bld: 94 mg/dL (ref 70–99)
Potassium: 4.4 mmol/L (ref 3.5–5.1)
Sodium: 135 mmol/L (ref 135–145)

## 2021-01-21 LAB — CBC WITH DIFFERENTIAL/PLATELET
Abs Immature Granulocytes: 0.01 10*3/uL (ref 0.00–0.07)
Basophils Absolute: 0 10*3/uL (ref 0.0–0.1)
Basophils Relative: 1 %
Eosinophils Absolute: 0.2 10*3/uL (ref 0.0–0.5)
Eosinophils Relative: 4 %
HCT: 49.7 % (ref 39.0–52.0)
Hemoglobin: 16.7 g/dL (ref 13.0–17.0)
Immature Granulocytes: 0 %
Lymphocytes Relative: 23 %
Lymphs Abs: 1.2 10*3/uL (ref 0.7–4.0)
MCH: 31.8 pg (ref 26.0–34.0)
MCHC: 33.6 g/dL (ref 30.0–36.0)
MCV: 94.7 fL (ref 80.0–100.0)
Monocytes Absolute: 0.5 10*3/uL (ref 0.1–1.0)
Monocytes Relative: 10 %
Neutro Abs: 3.4 10*3/uL (ref 1.7–7.7)
Neutrophils Relative %: 62 %
Platelets: 153 10*3/uL (ref 150–400)
RBC: 5.25 MIL/uL (ref 4.22–5.81)
RDW: 11.8 % (ref 11.5–15.5)
WBC: 5.4 10*3/uL (ref 4.0–10.5)
nRBC: 0 % (ref 0.0–0.2)

## 2021-01-21 MED ORDER — MORPHINE SULFATE (PF) 4 MG/ML IV SOLN
4.0000 mg | Freq: Once | INTRAVENOUS | Status: AC
  Administered 2021-01-21: 4 mg via INTRAVENOUS
  Filled 2021-01-21: qty 1

## 2021-01-21 MED ORDER — CLINDAMYCIN PHOSPHATE 600 MG/50ML IV SOLN
600.0000 mg | Freq: Once | INTRAVENOUS | Status: AC
  Administered 2021-01-21: 600 mg via INTRAVENOUS
  Filled 2021-01-21: qty 50

## 2021-01-21 MED ORDER — CLINDAMYCIN HCL 150 MG PO CAPS: 300.0000 mg | ORAL_CAPSULE | Freq: Three times a day (TID) | ORAL | 0 refills | Status: AC

## 2021-01-21 MED ORDER — IOHEXOL 300 MG/ML  SOLN
75.0000 mL | Freq: Once | INTRAMUSCULAR | Status: AC | PRN
  Administered 2021-01-21: 75 mL via INTRAVENOUS

## 2021-01-21 MED ORDER — AMOXICILLIN-POT CLAVULANATE 875-125 MG PO TABS: 1.0000 | ORAL_TABLET | Freq: Two times a day (BID) | ORAL | 0 refills | Status: DC

## 2021-01-21 NOTE — ED Triage Notes (Signed)
The pt has redness and swelling to the lt side of his face  And lt  Eye for 3-4 it was a pumple

## 2021-01-21 NOTE — Discharge Instructions (Signed)
Take antibiotics as prescribed.  Take entire course, even if your symptoms improve. Your infection is likely coming from a tooth infection.  Once this infection is cleared, I recommend you follow-up with a dentist for further management of your teeth. Take Tylenol or ibuprofen as needed for pain. Take home medications as prescribed. Return to the emergency room if you develop fevers after being on antibiotics for more than 24 hours, vision changes, inability move the eye, any new, worsening, or concerning symptoms.

## 2021-01-21 NOTE — ED Provider Notes (Signed)
[facility] EMERGENCY DEPARTMENT Provider Note   CSN: [id number] Arrival date & time: 01/21/21  0444     History Chief Complaint  Patient presents with  . Facial Swelling    Bobby Horn is a 55 y.o. male presenting for evaluation of left-sided facial pain and swelling.  Patient states that the past 3 to 4 days, he has had pain and swelling in his left face.  It began as a small area, is gradually spread toward his eye.  He now has severe pain, including behind his eye.  He is able to move his left eye, but reports pain with this.  He also reports dental pain.  He reports a history of similar several months ago, this was treated successfully with antibiotics.  He denies trauma or injury.  No fevers, chills, nausea, vomiting.  No difficulty swallowing or opening his mouth.  He is not taking anything for it including Tylenol ibuprofen.  He is not currently on an antibiotics.  Additional history taken chart reviewed.  Patient with a history of alcohol use, GERD, CAD, hypertension.  HPI     Past Medical History:  Diagnosis Date  . Alcohol abuse   . Angina   . Anxiety   . Arthritis   . Atrial fibrillation (HCC)   . Chronic pain syndrome    Narcotic seeking behavior with numerous requests for pain medications, IV dilaudid  . Coronary artery disease   . GERD (gastroesophageal reflux disease)   . 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 Gout   . Headache(784.0)   . Hypertension   . Hypertension   . Nephrolithiasis 04/2011   Left ureteral stone  . Shortness of breath   . 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 Tobacco abuse     Patient Active Problem List   Diagnosis Date Noted  . S/P lumbar fusion 06/10/2015  . Fracture of thumb 06/04/2015  . Lumbar vertebral  fracture (HCC) 06/03/2015  . Fall 06/03/2015  . Chest pain, musculoskeletal 11/06/2011  . Chest pain 11/04/2011  . Hypertension 11/04/2011  . Chronic pain syndrome   . Supraventricular tachycardia (HCC)   . Alcohol abuse   . Tobacco abuse   . Gout   . Nephrolithiasis 04/19/2011  . ALCOHOL ABUSE 06/17/2009    Past Surgical History:  Procedure Laterality Date  . CARDIAC CATHETERIZATION  07/01/2010  . KNEE ARTHROPLASTY     left       Family History  Problem Relation Age of Onset  . Diabetes Mother        No known heart disease, didn't know his father  . Coronary artery disease Mother   . Other Other        grandmother-hx of palpitations    Social History   Tobacco Use  . Smoking status: Current Every Day Smoker    Packs/day: 0.25    Years: 20.00    Pack years: 5.00    Types: Cigarettes  . Smokeless tobacco: Never Used  . Tobacco comment: Previously 2 ppd, now 0.25 ppd  Substance Use Topics  . Alcohol use: Yes    Comment: States he has not drank since the ablation in 2011  . Drug use: No    Home Medications Prior to Admission medications   Medication Sig Start Date End Date Taking? Authorizing  Provider  amoxicillin-clavulanate (AUGMENTIN) 875-125 MG tablet Take 1 tablet by mouth every 12 (twelve) hours. 01/21/21  Yes Aubra Pappalardo, PA-C  clindamycin (CLEOCIN) 150 MG capsule Take 2 capsules (300 mg total) by mouth 3 (three) times daily for 7 days. 01/21/21 01/28/21 Yes Makendra Vigeant, PA-C  etodolac (LODINE) 300 MG capsule Take 1 capsule (300 mg total) by mouth every 8 (eight) hours. 09/16/19   Linwood Dibbles, MD  ibuprofen (ADVIL,MOTRIN) 800 MG tablet Take 1 tablet (800 mg total) by mouth 3 (three) times daily. 02/03/18   Elson Areas, PA-C  traMADol (ULTRAM) 50 MG tablet Take 1 tablet (50 mg total) by mouth every 6 (six) hours as needed. 02/03/18   Elson Areas, PA-C  metoprolol (LOPRESSOR) 50 MG tablet Take 50 mg by mouth 2 (two) times daily. 11/06/11 03/13/12   Clydia Llano, MD    Allergies    Patient has no known allergies.  Review of Systems   Review of Systems  HENT: Positive for dental problem and facial swelling.   Eyes: Positive for pain.  All other systems reviewed and are negative.   Physical Exam Updated Vital Signs BP (!) 138/92 (BP Location: Right Arm)   Pulse 75   Temp 98.9 F (37.2 C) (Oral)   Resp 17   Ht 6\' 1"  (1.854 m)   Wt 100 kg   SpO2 97%   BMI 29.09 kg/m   Physical Exam Vitals and nursing note reviewed.  Constitutional:      General: He is not in acute distress.    Appearance: He is well-developed and well-nourished.     Comments: Appears uncomfortable due to pain  HENT:     Head: Normocephalic and atraumatic.     Comments: Extensive left-sided facial swelling and erythema extending to the left eye. Left upper tooth tender with surrounding gingival edema.  No palpable abscess.    Mouth/Throat:     Dentition: Abnormal dentition. Dental tenderness, gingival swelling and dental caries present.   Eyes:     Extraocular Movements: EOM normal.     Conjunctiva/sclera: Conjunctivae normal.     Pupils: Pupils are equal, round, and reactive to light.     Comments: EOMI, although patient reports pain with movement.  Cardiovascular:     Rate and Rhythm: Normal rate and regular rhythm.     Pulses: Intact distal pulses.  Pulmonary:     Effort: Pulmonary effort is normal. No respiratory distress.     Breath sounds: Normal breath sounds. No wheezing.  Abdominal:     General: Bowel sounds are normal. There is no distension.     Palpations: Abdomen is soft.     Tenderness: There is no abdominal tenderness.  Musculoskeletal:        General: Normal range of motion.     Cervical back: Normal range of motion and neck supple.  Skin:    General: Skin is warm and dry.     Capillary Refill: Capillary refill takes less than 2 seconds.  Neurological:     Mental Status: He is alert and oriented to person, place, and time.   Psychiatric:        Mood and Affect: Mood and affect normal.     ED Results / Procedures / Treatments   Labs (all labs ordered are listed, but only abnormal results are displayed) Labs Reviewed  BASIC METABOLIC PANEL - Abnormal; Notable for the following components:      Result Value   Calcium 8.5 (*)  All other components within normal limits  CBC WITH DIFFERENTIAL/PLATELET    EKG None  Radiology CT Orbits W Contrast  Result Date: 01/21/2021 CLINICAL DATA:  Orbital cellulitis.  Facial injury while shaving. EXAM: CT ORBITS WITH CONTRAST TECHNIQUE: Multidetector CT images was performed according to the standard protocol following intravenous contrast administration. CONTRAST:  66mL OMNIPAQUE IOHEXOL 300 MG/ML  SOLN COMPARISON:  02/27/2020 maxillofacial CT. FINDINGS: Orbits: No postseptal inflammation. Unremarkable appearance of the globes, extraocular muscles, nerve sheath complexes. Visualized sinuses: Focal mucosal thickening in the anterior left maxillary sinus which is likely odontogenic given constellation of findings. This opacity is new from prior. Soft tissues: Soft tissue thickening involving the upper lip and pre maxillary soft tissues asymmetric to the left. The inflammation is centered in the deep soft tissues with limited skin thickening. There are multiple dental caries with a subtle periapical erosion involving the left upper canine. No visible subperiosteal abscess. Limited intracranial: Negative IMPRESSION: Left facial cellulitis, favored odontogenic. No abscess or orbital inflammation. Electronically Signed   By: Marnee Spring M.D.   On: 01/21/2021 10:26    Procedures Procedures   Medications Ordered in ED Medications  clindamycin (CLEOCIN) IVPB 600 mg (0 mg Intravenous Stopped 01/21/21 0847)  morphine 4 MG/ML injection 4 mg (4 mg Intravenous Given 01/21/21 0818)  iohexol (OMNIPAQUE) 300 MG/ML solution 75 mL (75 mLs Intravenous Contrast Given 01/21/21 1015)    ED  Course  I have reviewed the triage vital signs and the nursing notes.  Pertinent labs & imaging results that were available during my care of the patient were reviewed by me and considered in my medical decision making (see chart for details).    MDM Rules/Calculators/A&P                          Patient presented for evaluation of facial pain and swelling.  On exam, patient appears uncomfortable due to pain.  He has erythema, swelling, tenderness of the left side of the face.  Likely dental source, as patient has dental tenderness with surrounding gingival edema.  Also consider folliculitis as cause.  Likely cellulitis, as patient has had in the past, however due to to his pain behind his eye and pain with movement of the eye, will obtain CT to rule out orbital cellulitis.  IV antibiotics started.  Labs interpreted by me, overall reassuring. No leukocytosis.   CT shows cellulitis, but no significant abscess.  Likely.  [place] in nature.  No concerning signs around the orbits.  Discussed with patient.  He is feeling better after pain medication and first dose of antibiotics.  I discussed continued p.o. antibiotics at home, prompt return to the ER with any worsening symptoms.  Encouraged follow-up with dentistry once infection has cleared to prevent recurrence of symptoms.  At this time, patient appears safe for discharge. return precautions given.  Patient states he understands and agrees to plan.  Final Clinical Impression(s) / ED Diagnoses Final diagnoses:  Facial cellulitis  Dental infection    Rx / DC Orders ED Discharge Orders         Ordered    clindamycin (CLEOCIN) 150 MG capsule  3 times daily        01/21/21 1038    amoxicillin-clavulanate (AUGMENTIN) 875-125 MG tablet  Every 12 hours        01/21/21 1038           Malayia Spizzirri, PA-C 01/21/21 1107    [place],  Lorin Picket, MD 01/23/21 [phone]

## 2021-04-10 ENCOUNTER — Emergency Department (HOSPITAL_COMMUNITY)
Admission: EM | Admit: 2021-04-10 | Discharge: 2021-04-10 | Discharge status: Against Medical Advice | Payer: Medicaid Other

## 2021-04-10 NOTE — ED Notes (Signed)
Pt seen walking out of the waiting room lobby and did not return.

## 2021-07-01 ENCOUNTER — Other Ambulatory Visit: Payer: Self-pay

## 2021-07-01 ENCOUNTER — Encounter (HOSPITAL_COMMUNITY): Payer: Self-pay | Admitting: *Deleted

## 2021-07-01 ENCOUNTER — Emergency Department (HOSPITAL_COMMUNITY)
Admission: EM | Admit: 2021-07-01 | Discharge: 2021-07-01 | Disposition: A | Discharge status: Home / Self Care | Payer: Medicaid Other | Attending: Emergency Medicine | Admitting: Emergency Medicine

## 2021-07-01 ENCOUNTER — Emergency Department (HOSPITAL_COMMUNITY): Payer: Medicaid Other

## 2021-07-01 DIAGNOSIS — Z96652 Presence of left artificial knee joint: Secondary | ICD-10-CM | POA: Diagnosis not present

## 2021-07-01 DIAGNOSIS — N132 Hydronephrosis with renal and ureteral calculous obstruction: Secondary | ICD-10-CM | POA: Diagnosis not present

## 2021-07-01 DIAGNOSIS — I10 Essential (primary) hypertension: Secondary | ICD-10-CM | POA: Insufficient documentation

## 2021-07-01 DIAGNOSIS — R1031 Right lower quadrant pain: Secondary | ICD-10-CM | POA: Diagnosis present

## 2021-07-01 DIAGNOSIS — Z79899 Other long term (current) drug therapy: Secondary | ICD-10-CM | POA: Diagnosis not present

## 2021-07-01 DIAGNOSIS — F17210 Nicotine dependence, cigarettes, uncomplicated: Secondary | ICD-10-CM | POA: Insufficient documentation

## 2021-07-01 DIAGNOSIS — K219 Gastro-esophageal reflux disease without esophagitis: Secondary | ICD-10-CM | POA: Diagnosis not present

## 2021-07-01 DIAGNOSIS — I4891 Unspecified atrial fibrillation: Secondary | ICD-10-CM | POA: Diagnosis not present

## 2021-07-01 DIAGNOSIS — I2510 Atherosclerotic heart disease of native coronary artery without angina pectoris: Secondary | ICD-10-CM | POA: Diagnosis not present

## 2021-07-01 DIAGNOSIS — Z955 Presence of coronary angioplasty implant and graft: Secondary | ICD-10-CM | POA: Insufficient documentation

## 2021-07-01 DIAGNOSIS — N23 Unspecified renal colic: Secondary | ICD-10-CM

## 2021-07-01 LAB — LIPASE, BLOOD: Lipase: 23 U/L (ref 11–51)

## 2021-07-01 LAB — URINALYSIS, ROUTINE W REFLEX MICROSCOPIC
Bacteria, UA: NONE SEEN
Bilirubin Urine: NEGATIVE
Glucose, UA: NEGATIVE mg/dL
Ketones, ur: NEGATIVE mg/dL
Leukocytes,Ua: NEGATIVE
Nitrite: NEGATIVE
Protein, ur: 30 mg/dL — AB
RBC / HPF: >50 RBC/hpf — ABNORMAL HIGH (ref 0–5)
Specific Gravity, Urine: 1.028 (ref 1.005–1.030)
pH: 5 (ref 5.0–8.0)

## 2021-07-01 LAB — CBC WITH DIFFERENTIAL/PLATELET
Abs Immature Granulocytes: 0.04 10*3/uL (ref 0.00–0.07)
Basophils Absolute: 0.1 10*3/uL (ref 0.0–0.1)
Basophils Relative: 1 %
Eosinophils Absolute: 0.4 10*3/uL (ref 0.0–0.5)
Eosinophils Relative: 5 %
HCT: 51.8 % (ref 39.0–52.0)
Hemoglobin: 17.4 g/dL — ABNORMAL HIGH (ref 13.0–17.0)
Immature Granulocytes: 0 %
Lymphocytes Relative: 25 %
Lymphs Abs: 2.3 10*3/uL (ref 0.7–4.0)
MCH: 31.8 pg (ref 26.0–34.0)
MCHC: 33.6 g/dL (ref 30.0–36.0)
MCV: 94.7 fL (ref 80.0–100.0)
Monocytes Absolute: 0.7 10*3/uL (ref 0.1–1.0)
Monocytes Relative: 7 %
Neutro Abs: 5.7 10*3/uL (ref 1.7–7.7)
Neutrophils Relative %: 62 %
Platelets: 208 10*3/uL (ref 150–400)
RBC: 5.47 MIL/uL (ref 4.22–5.81)
RDW: 13 % (ref 11.5–15.5)
WBC: 9.2 10*3/uL (ref 4.0–10.5)
nRBC: 0 % (ref 0.0–0.2)

## 2021-07-01 LAB — COMPREHENSIVE METABOLIC PANEL
ALT: 19 U/L (ref 0–44)
AST: 16 U/L (ref 15–41)
Albumin: 4.3 g/dL (ref 3.5–5.0)
Alkaline Phosphatase: 58 U/L (ref 38–126)
Anion gap: 11 (ref 5–15)
BUN: 34 mg/dL — ABNORMAL HIGH (ref 6–20)
CO2: 25 mmol/L (ref 22–32)
Calcium: 9.5 mg/dL (ref 8.9–10.3)
Chloride: 101 mmol/L (ref 98–111)
Creatinine, Ser: 1.51 mg/dL — ABNORMAL HIGH (ref 0.61–1.24)
GFR, Estimated: 55 mL/min — ABNORMAL LOW (ref >60)
Glucose, Bld: 115 mg/dL — ABNORMAL HIGH (ref 70–99)
Potassium: 4.3 mmol/L (ref 3.5–5.1)
Sodium: 137 mmol/L (ref 135–145)
Total Bilirubin: 0.9 mg/dL (ref 0.3–1.2)
Total Protein: 7 g/dL (ref 6.5–8.1)

## 2021-07-01 MED ORDER — HYDROMORPHONE HCL 1 MG/ML IJ SOLN
0.5000 mg | Freq: Once | INTRAMUSCULAR | Status: AC
Start: 2021-07-01 — End: 2021-07-01
  Administered 2021-07-01: 0.5 mg via INTRAVENOUS
  Filled 2021-07-01: qty 1

## 2021-07-01 MED ORDER — HYDROMORPHONE HCL 1 MG/ML IJ SOLN
1.0000 mg | Freq: Once | INTRAMUSCULAR | Status: AC
  Administered 2021-07-01: 1 mg via INTRAVENOUS
  Filled 2021-07-01: qty 1

## 2021-07-01 MED ORDER — IBUPROFEN 400 MG PO TABS: 400.0000 mg | ORAL_TABLET | Freq: Three times a day (TID) | ORAL | 0 refills | Status: DC | PRN

## 2021-07-01 MED ORDER — KETOROLAC TROMETHAMINE 30 MG/ML IJ SOLN
30.0000 mg | Freq: Once | INTRAMUSCULAR | Status: AC
  Administered 2021-07-01: 30 mg via INTRAVENOUS
  Filled 2021-07-01: qty 1

## 2021-07-01 MED ORDER — LACTATED RINGERS IV BOLUS
1000.0000 mL | Freq: Once | INTRAVENOUS | Status: AC
  Administered 2021-07-01: 1000 mL via INTRAVENOUS

## 2021-07-01 NOTE — ED Notes (Signed)
Oxygen saturation not accurate d/t pt sweating and moving around in triage

## 2021-07-01 NOTE — ED Notes (Signed)
Pt given cold wet cloth

## 2021-07-01 NOTE — ED Notes (Signed)
Patient transported to CT 

## 2021-07-01 NOTE — ED Notes (Signed)
Pt vomiting  Will reach out for further medication orders

## 2021-07-01 NOTE — ED Provider Notes (Signed)
Emergency Medicine Provider Triage Evaluation Note  Bobby Horn , a 55 y.o. male  was evaluated in triage.  Pt complains of right sided abdominal pain.  Sudden onset yesterday around 1700 but got acutely worse this morning.  Feels pain all through right side but worse along right lower abdomen.  Some nausea/vomiting.  Denies testicle pain/swelling.  Urine has been dark, denies frank blood.  fentanyl with EMS but no change in pain.    Review of Systems  Positive: Right abdominal pain, vomiting, dark urine Negative: Fever, chills  Physical Exam  BP (!) 152/101 (BP Location: Right Arm)   Pulse 78   Temp 98.9 F (37.2 C) (Oral)   Resp (!) 22   SpO2 (!) 87%   Gen:   Awake, appears uncomfortable, writhing in chair, sweating Resp:  Normal effort  MSK:   Moves extremities without difficulty  Other:    Medical Decision Making  Medically screening exam initiated at 4:52 AM.  Appropriate orders placed.  KIP CROPP was informed that the remainder of the evaluation will be completed by another provider, this initial triage assessment does not replace that evaluation, and the importance of remaining in the ED until their evaluation is complete.  Concerns for kidney stone. Per chart review has history of same but patient denies.  Will get labs, UA, CT renal stone study.   Garlon Hatchet, PA-C 07/01/21 2330    Benjiman Core, MD 07/01/21 [phone]

## 2021-07-01 NOTE — ED Notes (Signed)
Pt quiet in bed
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
## 2021-07-01 NOTE — ED Triage Notes (Signed)
Presents to ed via [facility] c/o severe right lower abd. Pain onset 5 pm, pain is clammy, c/o nausea.

## 2021-12-02 NOTE — H&P (Signed)
° ° °  Patient referred by general dentist for extraction all remaining teeth  CC: Painful teeth  Past Medical History:  Smoker, HTN, Gout, GERD, A fib (in remission), SVT (in  remission),ETOH      Medications: None    Allergies:     NKDA    Surgeries:   Heart Surgery-Cath in 2011, back surgery     Social History       Smoking: 1 ppd           Alcohol: Drug use:                             Exam: BMI 30. Multiple carious teeth # 1, 2, 3, 4, 5, 6, 7, 9, 10, 11, 12, 13, 15, 19, 21, 22, 23, 24, 25, 26, 27, 28, 29, 30. Right lingual torus, palatal torus.  No purulence, edema, fluctuance, trismus. Oral cancer screening negative. Pharynx clear. No lymphadenopathy.  Panorex:Multiple carious teeth # 1, 2, 3, 4, 5, 6, 7, 9, 10, 11, 12, 13, 15, 19, 21, 22, 23, 24, 25, 26, 27, 28, 29, 30.   Assessment: ASA 2. Non-restorable     teeth # 1, 2, 3, 4, 5, 6, 7, 9, 10, 11, 12, 13, 15, 19, 21, 22, 23, 24, 25, 26, 27, 28, 29, 30. Right lingual torus. Palatal torus.            Plan: Extraction Teeth # 1, 2, 3, 4, 5, 6, 7, 9, 10, 11, 12, 13, 15, 19, 21, 22, 23, 24, 25, 26, 27, 28, 29, 30. Alveoloplasty. Removal lingual torus right. Removal palatal torus.    Hospital Day surgery.                 Rx: n               Risks and complications explained. Questions answered.   Georgia Lopes, DMD

## 2021-12-22 ENCOUNTER — Encounter (HOSPITAL_COMMUNITY): Payer: Self-pay | Admitting: Oral Surgery

## 2021-12-22 ENCOUNTER — Other Ambulatory Visit: Payer: Self-pay

## 2021-12-22 NOTE — Progress Notes (Signed)
PCP - Dr Benna DunksBarber Cardiologist - Denies  PPM/ICD - Denies   Chest x-ray - 07/01/21 EKG - DOS Stress Test - 2011 ECHO - 06/30/10 Cardiac Cath - 2008   ERAS Protcol - NPO  COVID TEST- N/A Ambulatory  Anesthesia review: N  Patient verbally denies any shortness of breath, fever, cough and chest pain during phone call   -------------  SDW INSTRUCTIONS given:  Your procedure is scheduled on 12/24/21.  [facility]" at 0615 A.M., and check in at the Admitting office.  Call this number if you have problems the morning of surgery:  [phone]   Remember:  Do not eat after midnight the night before your surgery  Take these medicines the morning of surgery with A SIP OF WATER: Amlodipine  As of today, STOP taking any Suboxone, Aspirin (unless otherwise instructed by your surgeon) Aleve, Naproxen, Ibuprofen, Motrin, Advil, Goody's, BC's, all herbal medications, fish oil, and all vitamins.                      Do not wear jewelry, make up, or nail polish            Do not wear lotions, powders, perfumes/colognes, or deodorant.            Do not shave 48 hours prior to surgery.  Men may shave face and neck.            Do not bring valuables to the hospital.            [facility] is not responsible for any belongings or valuables.  Do NOT Smoke (Tobacco/Vaping) or drink Alcohol 24 hours prior to your procedure If you use a CPAP at night, you may bring all equipment for your overnight stay.   Contacts, glasses, dentures or bridgework may not be worn into surgery.      For patients admitted to the hospital, discharge time will be determined by your treatment team.   Patients discharged the day of surgery will not be allowed to drive home, and someone needs to stay with them for 24 hours.    Special instructions:   [place]- Preparing For Surgery  Before surgery, you can play an important role. Because skin is not sterile, your skin needs to be as free  of germs as possible. You can reduce the number of germs on your skin by washing with CHG (chlorahexidine gluconate) Soap before surgery.  CHG is an antiseptic cleaner which kills germs and bonds with the skin to continue killing germs even after washing.    Oral Hygiene is also important to reduce your risk of infection.  Remember - BRUSH YOUR TEETH THE MORNING OF SURGERY WITH YOUR REGULAR TOOTHPASTE  Please do not use if you have an allergy to CHG or antibacterial soaps. If your skin becomes reddened/irritated stop using the CHG.  Do not shave (including legs and underarms) for at least 48 hours prior to first CHG shower. It is OK to shave your face.  Please follow these instructions carefully.   Shower the NIGHT BEFORE SURGERY and the MORNING OF SURGERY with DIAL Soap.   Pat yourself dry with a CLEAN TOWEL.  Wear CLEAN PAJAMAS to bed the night before surgery  Place CLEAN SHEETS on your bed the night of your first shower and DO NOT SLEEP WITH PETS.   Day of Surgery: Please shower morning of surgery  Wear Clean/Comfortable clothing the morning of surgery  Do not apply any deodorants/lotions.   Remember to brush your teeth WITH YOUR REGULAR TOOTHPASTE.   Questions were answered. Patient verbalized understanding of instructions.

## 2021-12-24 ENCOUNTER — Encounter (HOSPITAL_COMMUNITY): Payer: Self-pay | Admitting: Oral Surgery

## 2021-12-24 ENCOUNTER — Encounter (HOSPITAL_COMMUNITY)
Admission: RE | Disposition: A | Discharge status: Home / Self Care | Payer: Self-pay | Source: Home / Self Care | Attending: Oral Surgery

## 2021-12-24 ENCOUNTER — Ambulatory Visit (HOSPITAL_COMMUNITY)
Admission: RE | Admit: 2021-12-24 | Discharge: 2021-12-24 | Disposition: A | Discharge status: Home / Self Care | Payer: Medicaid Other | Attending: Oral Surgery | Admitting: Oral Surgery

## 2021-12-24 ENCOUNTER — Ambulatory Visit (HOSPITAL_COMMUNITY): Payer: Medicaid Other | Admitting: Certified Registered Nurse Anesthetist

## 2021-12-24 ENCOUNTER — Other Ambulatory Visit: Payer: Self-pay

## 2021-12-24 DIAGNOSIS — M270 Developmental disorders of jaws: Secondary | ICD-10-CM | POA: Diagnosis not present

## 2021-12-24 DIAGNOSIS — F17200 Nicotine dependence, unspecified, uncomplicated: Secondary | ICD-10-CM | POA: Diagnosis not present

## 2021-12-24 DIAGNOSIS — K029 Dental caries, unspecified: Secondary | ICD-10-CM | POA: Insufficient documentation

## 2021-12-24 DIAGNOSIS — F1010 Alcohol abuse, uncomplicated: Secondary | ICD-10-CM

## 2021-12-24 DIAGNOSIS — I10 Essential (primary) hypertension: Secondary | ICD-10-CM

## 2021-12-24 DIAGNOSIS — Z01818 Encounter for other preprocedural examination: Secondary | ICD-10-CM

## 2021-12-24 HISTORY — PX: TOOTH EXTRACTION: SHX859

## 2021-12-24 LAB — SURGICAL PCR SCREEN
MRSA, PCR: NEGATIVE
Staphylococcus aureus: POSITIVE — AB

## 2021-12-24 LAB — CBC
HCT: 47.3 % (ref 39.0–52.0)
Hemoglobin: 16.6 g/dL (ref 13.0–17.0)
MCH: 32.3 pg (ref 26.0–34.0)
MCHC: 35.1 g/dL (ref 30.0–36.0)
MCV: 92 fL (ref 80.0–100.0)
Platelets: 172 10*3/uL (ref 150–400)
RBC: 5.14 MIL/uL (ref 4.22–5.81)
RDW: 12.5 % (ref 11.5–15.5)
WBC: 5.2 10*3/uL (ref 4.0–10.5)
nRBC: 0 % (ref 0.0–0.2)

## 2021-12-24 LAB — COMPREHENSIVE METABOLIC PANEL
ALT: 16 U/L (ref 0–44)
AST: 22 U/L (ref 15–41)
Albumin: 3.8 g/dL (ref 3.5–5.0)
Alkaline Phosphatase: 58 U/L (ref 38–126)
Anion gap: 7 (ref 5–15)
BUN: 19 mg/dL (ref 6–20)
CO2: 26 mmol/L (ref 22–32)
Calcium: 8.5 mg/dL — ABNORMAL LOW (ref 8.9–10.3)
Chloride: 102 mmol/L (ref 98–111)
Creatinine, Ser: 0.89 mg/dL (ref 0.61–1.24)
GFR, Estimated: >60 mL/min (ref >60)
Glucose, Bld: 88 mg/dL (ref 70–99)
Potassium: 4 mmol/L (ref 3.5–5.1)
Sodium: 135 mmol/L (ref 135–145)
Total Bilirubin: 1.2 mg/dL (ref 0.3–1.2)
Total Protein: 6.4 g/dL — ABNORMAL LOW (ref 6.5–8.1)

## 2021-12-24 SURGERY — DENTAL RESTORATION/EXTRACTIONS
Anesthesia: General | Site: Mouth

## 2021-12-24 MED ORDER — FENTANYL CITRATE (PF) 250 MCG/5ML IJ SOLN
INTRAMUSCULAR | Status: AC
  Filled 2021-12-24: qty 5

## 2021-12-24 MED ORDER — CEFAZOLIN SODIUM-DEXTROSE 2-4 GM/100ML-% IV SOLN
INTRAVENOUS | Status: AC
  Filled 2021-12-24: qty 100

## 2021-12-24 MED ORDER — LIDOCAINE 2% (20 MG/ML) 5 ML SYRINGE
INTRAMUSCULAR | Status: AC
  Filled 2021-12-24: qty 5

## 2021-12-24 MED ORDER — SODIUM CHLORIDE 0.9 % IR SOLN
Status: DC | PRN
  Administered 2021-12-24: 250 mL

## 2021-12-24 MED ORDER — ONDANSETRON HCL 4 MG/2ML IJ SOLN
INTRAMUSCULAR | Status: AC
  Filled 2021-12-24: qty 2

## 2021-12-24 MED ORDER — CHLORHEXIDINE GLUCONATE 0.12 % MT SOLN
OROMUCOSAL | Status: AC
  Administered 2021-12-24: 15 mL via OROMUCOSAL
  Filled 2021-12-24: qty 15

## 2021-12-24 MED ORDER — PHENYLEPHRINE 40 MCG/ML (10ML) SYRINGE FOR IV PUSH (FOR BLOOD PRESSURE SUPPORT)
PREFILLED_SYRINGE | INTRAVENOUS | Status: DC | PRN
Start: 2021-12-24 — End: 2021-12-24
  Administered 2021-12-24 (×3): 80 ug via INTRAVENOUS

## 2021-12-24 MED ORDER — 0.9 % SODIUM CHLORIDE (POUR BTL) OPTIME
TOPICAL | Status: DC | PRN
  Administered 2021-12-24: 1000 mL

## 2021-12-24 MED ORDER — PROPOFOL 10 MG/ML IV BOLUS
INTRAVENOUS | Status: AC
  Filled 2021-12-24: qty 20

## 2021-12-24 MED ORDER — DEXMEDETOMIDINE (PRECEDEX) IN NS 20 MCG/5ML (4 MCG/ML) IV SYRINGE
PREFILLED_SYRINGE | INTRAVENOUS | Status: DC | PRN
  Administered 2021-12-24: 12 ug via INTRAVENOUS

## 2021-12-24 MED ORDER — CHLORHEXIDINE GLUCONATE 0.12 % MT SOLN: 15.0000 mL | Freq: Once | OROMUCOSAL | Status: AC

## 2021-12-24 MED ORDER — LIDOCAINE-EPINEPHRINE 2 %-1:100000 IJ SOLN
INTRAMUSCULAR | Status: DC | PRN
  Administered 2021-12-24: 20 mL via INTRADERMAL

## 2021-12-24 MED ORDER — LIDOCAINE 2% (20 MG/ML) 5 ML SYRINGE
INTRAMUSCULAR | Status: DC | PRN
Start: 2021-12-24 — End: 2021-12-24
  Administered 2021-12-24: 100 mg via INTRAVENOUS

## 2021-12-24 MED ORDER — ORAL CARE MOUTH RINSE: 15.0000 mL | Freq: Once | OROMUCOSAL | Status: AC

## 2021-12-24 MED ORDER — LIDOCAINE-EPINEPHRINE 2 %-1:100000 IJ SOLN
INTRAMUSCULAR | Status: AC
  Filled 2021-12-24: qty 1

## 2021-12-24 MED ORDER — OXYMETAZOLINE HCL 0.05 % NA SOLN
NASAL | Status: DC | PRN
  Administered 2021-12-24: 2 via NASAL

## 2021-12-24 MED ORDER — FENTANYL CITRATE (PF) 100 MCG/2ML IJ SOLN: 25.0000 ug | INTRAMUSCULAR | Status: DC | PRN

## 2021-12-24 MED ORDER — OXYCODONE-ACETAMINOPHEN 5-325 MG PO TABS: 1.0000 | ORAL_TABLET | ORAL | 0 refills | Status: AC | PRN

## 2021-12-24 MED ORDER — MIDAZOLAM HCL 2 MG/2ML IJ SOLN
INTRAMUSCULAR | Status: AC
  Filled 2021-12-24: qty 2

## 2021-12-24 MED ORDER — PHENYLEPHRINE HCL-NACL 20-0.9 MG/250ML-% IV SOLN
INTRAVENOUS | Status: DC | PRN
  Administered 2021-12-24: 25 ug/min via INTRAVENOUS

## 2021-12-24 MED ORDER — CEFAZOLIN SODIUM-DEXTROSE 2-4 GM/100ML-% IV SOLN
2.0000 g | INTRAVENOUS | Status: AC
  Administered 2021-12-24: 2 g via INTRAVENOUS

## 2021-12-24 MED ORDER — SUGAMMADEX SODIUM 200 MG/2ML IV SOLN
INTRAVENOUS | Status: DC | PRN
Start: 2021-12-24 — End: 2021-12-24
  Administered 2021-12-24: 200 mg via INTRAVENOUS

## 2021-12-24 MED ORDER — DEXMEDETOMIDINE (PRECEDEX) IN NS 20 MCG/5ML (4 MCG/ML) IV SYRINGE
PREFILLED_SYRINGE | INTRAVENOUS | Status: AC
  Filled 2021-12-24: qty 5

## 2021-12-24 MED ORDER — PROPOFOL 10 MG/ML IV BOLUS
INTRAVENOUS | Status: DC | PRN
Start: 2021-12-24 — End: 2021-12-24
  Administered 2021-12-24: 200 mg via INTRAVENOUS

## 2021-12-24 MED ORDER — ACETAMINOPHEN 10 MG/ML IV SOLN
INTRAVENOUS | Status: DC | PRN
Start: 2021-12-24 — End: 2021-12-24
  Administered 2021-12-24: 1000 mg via INTRAVENOUS

## 2021-12-24 MED ORDER — LACTATED RINGERS IV SOLN: INTRAVENOUS | Status: DC

## 2021-12-24 MED ORDER — ROCURONIUM BROMIDE 10 MG/ML (PF) SYRINGE
PREFILLED_SYRINGE | INTRAVENOUS | Status: AC
  Filled 2021-12-24: qty 10

## 2021-12-24 MED ORDER — ONDANSETRON HCL 4 MG/2ML IJ SOLN: 4.0000 mg | Freq: Once | INTRAMUSCULAR | Status: DC | PRN

## 2021-12-24 MED ORDER — ROCURONIUM BROMIDE 10 MG/ML (PF) SYRINGE
PREFILLED_SYRINGE | INTRAVENOUS | Status: DC | PRN
  Administered 2021-12-24: 60 mg via INTRAVENOUS

## 2021-12-24 MED ORDER — MIDAZOLAM HCL 2 MG/2ML IJ SOLN
INTRAMUSCULAR | Status: DC | PRN
Start: 2021-12-24 — End: 2021-12-24
  Administered 2021-12-24: 2 mg via INTRAVENOUS

## 2021-12-24 MED ORDER — ACETAMINOPHEN 325 MG PO TABS
325.0000 mg | ORAL_TABLET | ORAL | Status: DC | PRN
Start: 2021-12-24 — End: 2021-12-24

## 2021-12-24 MED ORDER — DEXAMETHASONE SODIUM PHOSPHATE 10 MG/ML IJ SOLN
INTRAMUSCULAR | Status: DC | PRN
  Administered 2021-12-24: 10 mg via INTRAVENOUS

## 2021-12-24 MED ORDER — ACETAMINOPHEN 160 MG/5ML PO SOLN: 325.0000 mg | ORAL | Status: DC | PRN

## 2021-12-24 MED ORDER — ONDANSETRON HCL 4 MG/2ML IJ SOLN
INTRAMUSCULAR | Status: DC | PRN
  Administered 2021-12-24: 4 mg via INTRAVENOUS

## 2021-12-24 MED ORDER — AMOXICILLIN 500 MG PO CAPS: 500.0000 mg | ORAL_CAPSULE | Freq: Three times a day (TID) | ORAL | 0 refills | Status: DC

## 2021-12-24 MED ORDER — PHENYLEPHRINE 40 MCG/ML (10ML) SYRINGE FOR IV PUSH (FOR BLOOD PRESSURE SUPPORT)
PREFILLED_SYRINGE | INTRAVENOUS | Status: AC
  Filled 2021-12-24: qty 10

## 2021-12-24 MED ORDER — ACETAMINOPHEN 10 MG/ML IV SOLN
INTRAVENOUS | Status: AC
  Filled 2021-12-24: qty 100

## 2021-12-24 MED ORDER — OXYCODONE HCL 5 MG PO TABS: 5.0000 mg | ORAL_TABLET | Freq: Once | ORAL | Status: DC | PRN

## 2021-12-24 MED ORDER — OXYCODONE HCL 5 MG/5ML PO SOLN: 5.0000 mg | Freq: Once | ORAL | Status: DC | PRN

## 2021-12-24 MED ORDER — FENTANYL CITRATE (PF) 250 MCG/5ML IJ SOLN
INTRAMUSCULAR | Status: DC | PRN
  Administered 2021-12-24: 100 ug via INTRAVENOUS

## 2021-12-24 MED ORDER — OXYMETAZOLINE HCL 0.05 % NA SOLN
NASAL | Status: AC
  Filled 2021-12-24: qty 30

## 2021-12-24 MED ORDER — MEPERIDINE HCL 25 MG/ML IJ SOLN: 6.2500 mg | INTRAMUSCULAR | Status: DC | PRN

## 2021-12-24 MED ORDER — DEXAMETHASONE SODIUM PHOSPHATE 10 MG/ML IJ SOLN
INTRAMUSCULAR | Status: AC
  Filled 2021-12-24: qty 1

## 2021-12-24 SURGICAL SUPPLY — 34 items
BAG COUNTER SPONGE SURGICOUNT (BAG) IMPLANT
BAG SPNG CNTER NS LX DISP (BAG)
BLADE SURG 15 STRL LF DISP TIS (BLADE) ×1 IMPLANT
BLADE SURG 15 STRL SS (BLADE) ×4
BUR CROSS CUT FISSURE 1.6 (BURR) ×2 IMPLANT
BUR EGG ELITE 4.0 (BURR) ×2 IMPLANT
CANISTER SUCT 3000ML PPV (MISCELLANEOUS) ×2 IMPLANT
COVER SURGICAL LIGHT HANDLE (MISCELLANEOUS) ×2 IMPLANT
DRAPE U-SHAPE 76X120 STRL (DRAPES) ×2 IMPLANT
GAUZE PACKING FOLDED 2  STR (GAUZE/BANDAGES/DRESSINGS) ×2
GAUZE PACKING FOLDED 2 STR (GAUZE/BANDAGES/DRESSINGS) ×1 IMPLANT
GLOVE SURG ENC MOIS LTX SZ8 (GLOVE) ×2 IMPLANT
GLOVE SURG UNDER POLY LF SZ7 (GLOVE) IMPLANT
GOWN STRL REUS W/ TWL LRG LVL3 (GOWN DISPOSABLE) ×1 IMPLANT
GOWN STRL REUS W/ TWL XL LVL3 (GOWN DISPOSABLE) ×1 IMPLANT
GOWN STRL REUS W/TWL LRG LVL3 (GOWN DISPOSABLE) ×2
GOWN STRL REUS W/TWL XL LVL3 (GOWN DISPOSABLE) ×2
IV NS 1000ML (IV SOLUTION) ×2
IV NS 1000ML BAXH (IV SOLUTION) ×1 IMPLANT
KIT BASIN OR (CUSTOM PROCEDURE TRAY) ×2 IMPLANT
KIT TURNOVER KIT B (KITS) ×2 IMPLANT
NDL HYPO 25GX1X1/2 BEV (NEEDLE) ×2 IMPLANT
NEEDLE HYPO 25GX1X1/2 BEV (NEEDLE) ×4 IMPLANT
NS IRRIG 1000ML POUR BTL (IV SOLUTION) ×2 IMPLANT
PAD ARMBOARD 7.5X6 YLW CONV (MISCELLANEOUS) ×2 IMPLANT
SLEEVE IRRIGATION ELITE 7 (MISCELLANEOUS) ×2 IMPLANT
SPONGE SURGIFOAM ABS GEL 12-7 (HEMOSTASIS) IMPLANT
SUT CHROMIC 3 0 PS 2 (SUTURE) ×3 IMPLANT
SUT CHROMIC 4 0 RB 1X27 (SUTURE) ×1 IMPLANT
SYR BULB IRRIG 60ML STRL (SYRINGE) ×2 IMPLANT
SYR CONTROL 10ML LL (SYRINGE) ×2 IMPLANT
TRAY ENT MC OR (CUSTOM PROCEDURE TRAY) ×2 IMPLANT
TUBING IRRIGATION (MISCELLANEOUS) ×2 IMPLANT
YANKAUER SUCT BULB TIP NO VENT (SUCTIONS) ×2 IMPLANT

## 2021-12-24 NOTE — H&P (Signed)
H&P documentation  -History and Physical Reviewed  -Patient has been re-examined  -No change in the plan of care  Bobby Horn  

## 2021-12-24 NOTE — Anesthesia Preprocedure Evaluation (Addendum)
Anesthesia Evaluation  Patient identified by MRN, date of birth, ID band Patient awake    Reviewed: Allergy & Precautions, NPO status , Patient's Chart, lab work & pertinent test results  Airway Mallampati: II  TM Distance: >3 FB Neck ROM: Full    Dental no notable dental hx. (+) Poor Dentition, Chipped, Missing, Dental Advisory Given,    Pulmonary Current Smoker and Patient abstained from smoking.,    Pulmonary exam normal breath sounds clear to auscultation       Cardiovascular hypertension, + angina + CAD  + dysrhythmias Atrial Fibrillation and Supra Ventricular Tachycardia  Rhythm:Regular Rate:Normal     Neuro/Psych  Headaches, Anxiety    GI/Hepatic GERD  Medicated,  Endo/Other    Renal/GU      Musculoskeletal  (+) Arthritis ,   Abdominal   Peds  Hematology   Anesthesia Other Findings gout  Reproductive/Obstetrics                            Anesthesia Physical  Anesthesia Plan  ASA: 3  Anesthesia Plan: General   Post-op Pain Management: Ofirmev IV (intra-op)   Induction: Intravenous  PONV Risk Score and Plan: 2 and Ondansetron and Dexamethasone  Airway Management Planned: Oral ETT and Nasal ETT  Additional Equipment: None  Intra-op Plan:   Post-operative Plan: Extubation in OR  Informed Consent: I have reviewed the patients History and Physical, chart, labs and discussed the procedure including the risks, benefits and alternatives for the proposed anesthesia with the patient or authorized representative who has indicated his/her understanding and acceptance.       Plan Discussed with: CRNA, Anesthesiologist and Surgeon  Anesthesia Plan Comments: (  )        Anesthesia Quick Evaluation

## 2021-12-24 NOTE — Op Note (Signed)
12/24/2021  11:21 AM  PATIENT:  Bobby Horn  56 y.o. male  PRE-OPERATIVE DIAGNOSIS:  NONRESTORABLE TEETH # 1, 2, 3, 4, 5, 6, 7, 9, 10, 11, 12, 13, 15, 17, 19, 21, 22, 23, 2,4 25, 26, 27, 28, 29, 30, RIGHT MANDIBULAR LINGUAL TORUS, PALATAL TORUS  POST-OPERATIVE DIAGNOSIS:  SAME  PROCEDURE:  Procedure(s): EXTRACTION TEETH # 1, 2, 3, 4, 5, 6, 7, 9, 10, 11, 12, 13, 15, 17, 19, 21, 22, 23, 24, 25, 26, 27, 28, 29, 30, ALVELOPLASTY RIGHT AND LEFT MAXILLA AND MANDIBLE, REMOVAL RIGHT MANDIBULAR LINGUAL TORUS, REMOVAL PALATAL TORUS  SURGEON:  Surgeon(s): Ocie Doyne, DMD  ANESTHESIA:   local and general  EBL:  minimal  DRAINS: none   SPECIMEN:  No Specimen  COUNTS:  YES  PLAN OF CARE: Discharge to home after PACU  PATIENT DISPOSITION:  PACU - hemodynamically stable.   PROCEDURE DETAILS: Dictation #[id number]  Georgia Lopes, DMD 12/24/2021 11:21 AM

## 2021-12-24 NOTE — Transfer of Care (Signed)
Immediate Anesthesia Transfer of Care Note  Patient: Bobby Horn  Procedure(s) Performed: DENTAL RESTORATION/EXTRACTIONS (Mouth)  Patient Location: PACU  Anesthesia Type:General  Level of Consciousness: awake and alert   Airway & Oxygen Therapy: Patient Spontanous Breathing and Patient connected to face mask oxygen  Post-op Assessment: Report given to RN and Post -op Vital signs reviewed and stable  Post vital signs: Reviewed and stable  Last Vitals:  Vitals Value Taken Time  BP 143/99 12/24/21 1152  Temp    Pulse 72 12/24/21 1154  Resp 12 12/24/21 1154  SpO2 100 % 12/24/21 1154  Vitals shown include unvalidated device data.  Last Pain:  Vitals:   12/24/21 0728  TempSrc:   PainSc: 0-No pain         Complications: No notable events documented.

## 2021-12-24 NOTE — Anesthesia Procedure Notes (Signed)
Procedure Name: Intubation Date/Time: 12/24/2021 9:54 AM Performed by: Reece Agar, CRNA Pre-anesthesia Checklist: Patient identified, Emergency Drugs available, Suction available and Patient being monitored Patient Re-evaluated:Patient Re-evaluated prior to induction Oxygen Delivery Method: Circle System Utilized Preoxygenation: Pre-oxygenation with 100% oxygen Induction Type: IV induction Ventilation: Mask ventilation without difficulty Laryngoscope Size: Mac and 4 Grade View: Grade I Nasal Tubes: Nasal Rae, Nasal prep performed, Right and Magill forceps- large, utilized Tube size: 7.5 mm Number of attempts: 1 Placement Confirmation: ETT inserted through vocal cords under direct vision, positive ETCO2 and breath sounds checked- equal and bilateral Secured at: 29 cm Tube secured with: Tape Dental Injury: Teeth and Oropharynx as per pre-operative assessment

## 2021-12-24 NOTE — Op Note (Signed)
NAME: Bobby Horn, Bobby Horn MEDICAL RECORD NO: [id number] ACCOUNT NO: [id number] DATE OF BIRTH: Dec 10, 1966 FACILITY: [facility] LOCATION: [facility] PHYSICIAN: Georgia Lopes, DDS  Operative Report   DATE OF PROCEDURE: 12/24/2021  PREOPERATIVE DIAGNOSES:  Nonrestorable teeth numbers 1, 2, 3, 4, 5, 6, 7, 9, 10, 11, 12, 13, 15, 17, 19, 21, 22, 23, 24, 25, 26, 27, 28, 29, 30.  Right mandibular lingual torus and palatal torus.  POSTOPERATIVE DIAGNOSES:  Nonrestorable teeth numbers 1, 2, 3, 4, 5, 6, 7, 9, 10, 11, 12, 13, 15, 17, 19, 21, 22, 23, 24, 25, 26, 27, 28, 29, 30.  Right mandibular lingual torus and palatal torus.  PROCEDURE:  Extraction teeth numbers 1, 2, 3, 4, 5, 6, 7, 9, 10, 11, 12, 13, 15, 19, 21, 22, 23, 24, 25, 26, 27, 28, 29, 30.  Alveoloplasty right and left maxilla and mandible.  Removal of right mandibular lingual torus. Removal of palatal torus.  SURGEON: Georgia Lopes, DDS  ANESTHESIA:  General, nasal intubation.  Dr. Tacy Dura attending.  DESCRIPTION OF PROCEDURE:  The patient was taken to the operating room and placed on the table in supine position.  General anesthesia was administered.  A nasal endotracheal tube was placed and secured.  The eyes were protected and the patient was  draped for surgery.  Timeout was performed.  The posterior pharynx was suctioned and a throat pack was placed.  2% lidocaine 1:100,000 epinephrine was infiltrated in an inferior alveolar block on the right and left sides and buccally and anterior  mandible as well as buccal and palatal infiltration of the maxilla around the teeth removed. A bite block was placed in the right side of the mouth.  A sweetheart retractor was used to retract the tongue.  A #15 blade used to make an incision beginning  at tooth #19, which was used to make an incision around tooth #17 in the gingival sulcus, carried forward to tooth #19 and then to 21 in both buccal and lingual sulcus.  Then, the incision was carried across the  midline in the gingival sulcus both  buccally and lingually until tooth #27 was encountered.  The periosteum was reflected from around these teeth.  Teeth #17 and 19 were elevated with 301 elevator and removed with dental forceps.  Teeth #23, 24, 25 and 26 were removed in a similar fashion.  Teeth #21 and 22 fractured upon attempted removal, necessitating removal of circumferential bone with a Stryker handpiece and fissure bur under irrigation. The roots of the teeth were elevated and removed with dental forceps and rongeurs.  The sockets  were curetted.  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 Alveoplasty was indicated and it was done using the egg bur under irrigation and the fissure bur, followed by  the bone file.  Then this area was irrigated and closed with 3-0 chromic.  The left maxilla was operated next. The 15 blade was used to make an incision in the sulcus of tooth #15, carried forward to tooth #13, 12, 11, 10, 9 in the buccal and palatal  sulcus.  The periosteum was reflected from around these teeth.  The teeth were elevated, but only 9 and 10 could be removed with the forceps. Teeth #15, 13, 12, 11 were nonmobile and the Stryker handpiece with fissure bur under irrigation was used to  remove bone from around these teeth and then the teeth  were elevated and removed with dental forceps and rongeurs.  The sockets were curetted.  The periosteum was reflected to expose the alveolar crest.  The bone was irregular owing to the ankylosis of  buccal bone to multiple roots causing sharp edges necessitating alveoplasty.  The egg bur and bone file were used to perform alveoplasty in the left maxilla and then the area was irrigated and closed with 3-0 chromic.  The bite block and sweetheart  retractor were repositioned to the other side of the mouth and the 15 blade was used to make an incision around teeth #27, 28, 29  and 30  in the mandible and around teeth #1, 2, 3, 4, 5, 6, 7 in the maxilla.  The periosteum was reflected.  The teeth were  elevated. Teeth #27 and 28 could not be mobilized and circumferential bone and interproximal bone was removed from around these teeth.  The teeth were elevated and removed from the mouth with a forceps.  Teeth #29 and 30 were removed with dental forceps  and the sockets were curetted.  The periosteum was reflected to expose the alveolar bone, which was irregular on the buccal aspect.  Alveoplasty was performed using the egg bur followed by the bone file.  Then, the lingual tissue was dissected  subperiosteally to expose the lingual torus. It was removed using a Seldin retractor to protect the lingual tissues and an egg bur under irrigation.  Then, the area was filed with a bone file to further smooth it and the right mandible was closed with  3-0 chromic.  Then, the right maxillary teeth were extracted:  Teeth #1, 2, 3, and 4 removed using the 301 elevator and dental forceps. Teeth #5 and 6 were removed after interproximal and circumferential bone was removed as the teeth were nonmobile  during attempted elevation. The teeth were eventually removed using the 301 elevator, rongeur, and dental forceps.  Tooth #7 was removed with a forceps in routine fashion.  Then the sockets were curetted.  The periosteum was reflected.  The bone was  irregular requiring alveoloplasty.  This was done with the egg bur and bone filed, then the area was irrigated and closed with 3-0 chromic.  Next, the palatal torus was operated.  The 15 blade was used to make a double Y incision, one proximal and one  distal along the midline of the palate, it was used to make a longitudinal incision from proximal to distal over the midline of the torus and at the ends of the torus a double Y incision was created.  The periosteum was reflected to expose the torus.  A  Seldin elevator was used to retract the lingual  tissues, protect the palatal tissues.  Then, the egg bur was used to reduce the torus by recontouring and the bone file was used to further smooth this area and then the area was irrigated and closed with  3-0 chromic and then additional local anesthetic was administered.  The oral cavity was irrigated and suctioned.  The throat packing was removed.  The patient was left under the care of anesthesia for extubation with plans for transport to recovery room  and discharged home through day surgery.  ESTIMATED BLOOD LOSS:  Minimum.  SPECIMENS:  None.  COMPLICATIONS:  None.   VAI D: 12/24/2021 11:29:32 am T: 12/24/2021 10:21:00 pm  JOB: [id number]/ [id number]

## 2021-12-26 ENCOUNTER — Encounter (HOSPITAL_COMMUNITY): Payer: Self-pay | Admitting: Oral Surgery

## 2022-01-03 ENCOUNTER — Encounter (HOSPITAL_COMMUNITY): Payer: Self-pay | Admitting: Oral Surgery

## 2022-01-09 NOTE — Anesthesia Postprocedure Evaluation (Signed)
Anesthesia Post Note  Patient: Bobby Horn  Procedure(s) Performed: DENTAL RESTORATION/EXTRACTIONS (Mouth)     Patient location during evaluation: PACU Anesthesia Type: General Level of consciousness: awake and alert Pain management: pain level controlled Vital Signs Assessment: post-procedure vital signs reviewed and stable Respiratory status: spontaneous breathing, nonlabored ventilation, respiratory function stable and patient connected to nasal cannula oxygen Cardiovascular status: blood pressure returned to baseline and stable Postop Assessment: no apparent nausea or vomiting Anesthetic complications: no   No notable events documented.  Last Vitals:  Vitals:   12/24/21 1205 12/24/21 1220  BP: 136/90 (!) 146/86  Pulse: 73   Resp: 12 10  Temp:  36.4 C  SpO2: 100%     Last Pain:  Vitals:   12/24/21 1220  TempSrc:   PainSc: 0-No pain                 Jaxsyn Catalfamo

## 2022-11-12 DIAGNOSIS — F19988 Other psychoactive substance use, unspecified with other psychoactive substance-induced disorder: Secondary | ICD-10-CM | POA: Diagnosis not present

## 2022-11-12 DIAGNOSIS — F1129 Opioid dependence with unspecified opioid-induced disorder: Secondary | ICD-10-CM | POA: Diagnosis not present

## 2022-11-12 DIAGNOSIS — F1123 Opioid dependence with withdrawal: Secondary | ICD-10-CM | POA: Diagnosis not present

## 2022-12-10 DIAGNOSIS — F1123 Opioid dependence with withdrawal: Secondary | ICD-10-CM | POA: Diagnosis not present

## 2022-12-10 DIAGNOSIS — F19980 Other psychoactive substance use, unspecified with psychoactive substance-induced anxiety disorder: Secondary | ICD-10-CM | POA: Diagnosis not present

## 2022-12-10 DIAGNOSIS — F1129 Opioid dependence with unspecified opioid-induced disorder: Secondary | ICD-10-CM | POA: Diagnosis not present

## 2022-12-24 DIAGNOSIS — F19980 Other psychoactive substance use, unspecified with psychoactive substance-induced anxiety disorder: Secondary | ICD-10-CM | POA: Diagnosis not present

## 2022-12-24 DIAGNOSIS — F1129 Opioid dependence with unspecified opioid-induced disorder: Secondary | ICD-10-CM | POA: Diagnosis not present

## 2022-12-24 DIAGNOSIS — F1123 Opioid dependence with withdrawal: Secondary | ICD-10-CM | POA: Diagnosis not present

## 2023-07-01 DIAGNOSIS — F19980 Other psychoactive substance use, unspecified with psychoactive substance-induced anxiety disorder: Secondary | ICD-10-CM | POA: Diagnosis not present

## 2023-07-01 DIAGNOSIS — F1129 Opioid dependence with unspecified opioid-induced disorder: Secondary | ICD-10-CM | POA: Diagnosis not present

## 2023-07-01 DIAGNOSIS — F1123 Opioid dependence with withdrawal: Secondary | ICD-10-CM | POA: Diagnosis not present

## 2023-07-01 DIAGNOSIS — I10 Essential (primary) hypertension: Secondary | ICD-10-CM | POA: Diagnosis not present

## 2024-01-19 DIAGNOSIS — F1123 Opioid dependence with withdrawal: Secondary | ICD-10-CM | POA: Diagnosis not present

## 2024-01-19 DIAGNOSIS — F19980 Other psychoactive substance use, unspecified with psychoactive substance-induced anxiety disorder: Secondary | ICD-10-CM | POA: Diagnosis not present

## 2024-01-19 DIAGNOSIS — F1129 Opioid dependence with unspecified opioid-induced disorder: Secondary | ICD-10-CM | POA: Diagnosis not present

## 2024-02-19 DIAGNOSIS — I10 Essential (primary) hypertension: Secondary | ICD-10-CM | POA: Diagnosis not present

## 2024-02-19 DIAGNOSIS — F1123 Opioid dependence with withdrawal: Secondary | ICD-10-CM | POA: Diagnosis not present

## 2024-02-19 DIAGNOSIS — F1129 Opioid dependence with unspecified opioid-induced disorder: Secondary | ICD-10-CM | POA: Diagnosis not present

## 2024-02-19 DIAGNOSIS — E291 Testicular hypofunction: Secondary | ICD-10-CM | POA: Diagnosis not present

## 2024-02-19 DIAGNOSIS — E119 Type 2 diabetes mellitus without complications: Secondary | ICD-10-CM | POA: Diagnosis not present

## 2024-02-19 DIAGNOSIS — F19980 Other psychoactive substance use, unspecified with psychoactive substance-induced anxiety disorder: Secondary | ICD-10-CM | POA: Diagnosis not present

## 2024-02-19 DIAGNOSIS — E785 Hyperlipidemia, unspecified: Secondary | ICD-10-CM | POA: Diagnosis not present

## 2024-02-19 DIAGNOSIS — Z125 Encounter for screening for malignant neoplasm of prostate: Secondary | ICD-10-CM | POA: Diagnosis not present

## 2024-03-15 DIAGNOSIS — F1129 Opioid dependence with unspecified opioid-induced disorder: Secondary | ICD-10-CM | POA: Diagnosis not present

## 2024-03-15 DIAGNOSIS — I10 Essential (primary) hypertension: Secondary | ICD-10-CM | POA: Diagnosis not present

## 2024-03-15 DIAGNOSIS — F19980 Other psychoactive substance use, unspecified with psychoactive substance-induced anxiety disorder: Secondary | ICD-10-CM | POA: Diagnosis not present

## 2024-03-15 DIAGNOSIS — F1123 Opioid dependence with withdrawal: Secondary | ICD-10-CM | POA: Diagnosis not present

## 2024-04-08 ENCOUNTER — Encounter (HOSPITAL_COMMUNITY): Payer: Self-pay | Admitting: *Deleted

## 2024-04-08 ENCOUNTER — Emergency Department (HOSPITAL_COMMUNITY)
Admission: EM | Admit: 2024-04-08 | Discharge: 2024-04-08 | Disposition: A | Discharge status: Home / Self Care | Attending: Emergency Medicine | Admitting: Emergency Medicine

## 2024-04-08 ENCOUNTER — Emergency Department (HOSPITAL_COMMUNITY)

## 2024-04-08 ENCOUNTER — Other Ambulatory Visit: Payer: Self-pay

## 2024-04-08 DIAGNOSIS — I2510 Atherosclerotic heart disease of native coronary artery without angina pectoris: Secondary | ICD-10-CM | POA: Insufficient documentation

## 2024-04-08 DIAGNOSIS — F17200 Nicotine dependence, unspecified, uncomplicated: Secondary | ICD-10-CM | POA: Insufficient documentation

## 2024-04-08 DIAGNOSIS — R1030 Lower abdominal pain, unspecified: Secondary | ICD-10-CM | POA: Insufficient documentation

## 2024-04-08 DIAGNOSIS — R109 Unspecified abdominal pain: Secondary | ICD-10-CM | POA: Diagnosis not present

## 2024-04-08 DIAGNOSIS — R110 Nausea: Secondary | ICD-10-CM | POA: Diagnosis not present

## 2024-04-08 DIAGNOSIS — M545 Low back pain, unspecified: Secondary | ICD-10-CM | POA: Insufficient documentation

## 2024-04-08 DIAGNOSIS — N3289 Other specified disorders of bladder: Secondary | ICD-10-CM | POA: Diagnosis not present

## 2024-04-08 LAB — COMPREHENSIVE METABOLIC PANEL WITH GFR
ALT: 89 U/L — ABNORMAL HIGH (ref 0–44)
AST: 33 U/L (ref 15–41)
Albumin: 3.4 g/dL — ABNORMAL LOW (ref 3.5–5.0)
Alkaline Phosphatase: 71 U/L (ref 38–126)
Anion gap: 10 (ref 5–15)
BUN: 18 mg/dL (ref 6–20)
CO2: 21 mmol/L — ABNORMAL LOW (ref 22–32)
Calcium: 8.6 mg/dL — ABNORMAL LOW (ref 8.9–10.3)
Chloride: 106 mmol/L (ref 98–111)
Creatinine, Ser: 1.23 mg/dL (ref 0.61–1.24)
GFR, Estimated: >60 mL/min (ref >60)
Glucose, Bld: 143 mg/dL — ABNORMAL HIGH (ref 70–99)
Potassium: 3.7 mmol/L (ref 3.5–5.1)
Sodium: 137 mmol/L (ref 135–145)
Total Bilirubin: 0.6 mg/dL (ref 0.0–1.2)
Total Protein: 5.9 g/dL — ABNORMAL LOW (ref 6.5–8.1)

## 2024-04-08 LAB — CBC
HCT: 47.5 % (ref 39.0–52.0)
Hemoglobin: 16.2 g/dL (ref 13.0–17.0)
MCH: 33.6 pg (ref 26.0–34.0)
MCHC: 34.1 g/dL (ref 30.0–36.0)
MCV: 98.5 fL (ref 80.0–100.0)
Platelets: 216 10*3/uL (ref 150–400)
RBC: 4.82 MIL/uL (ref 4.22–5.81)
RDW: 13.3 % (ref 11.5–15.5)
WBC: 8.1 10*3/uL (ref 4.0–10.5)
nRBC: 0 % (ref 0.0–0.2)

## 2024-04-08 LAB — URINALYSIS, ROUTINE W REFLEX MICROSCOPIC
Bilirubin Urine: NEGATIVE
Glucose, UA: NEGATIVE mg/dL
Hgb urine dipstick: NEGATIVE
Ketones, ur: 5 mg/dL — AB
Leukocytes,Ua: NEGATIVE
Nitrite: NEGATIVE
Protein, ur: NEGATIVE mg/dL
Specific Gravity, Urine: 1.035 — ABNORMAL HIGH (ref 1.005–1.030)
pH: 5 (ref 5.0–8.0)

## 2024-04-08 LAB — LIPASE, BLOOD: Lipase: 35 U/L (ref 11–51)

## 2024-04-08 MED ORDER — IOHEXOL 350 MG/ML SOLN
75.0000 mL | Freq: Once | INTRAVENOUS | Status: AC | PRN
  Administered 2024-04-08: 75 mL via INTRAVENOUS

## 2024-04-08 MED ORDER — PANTOPRAZOLE SODIUM 40 MG IV SOLR
40.0000 mg | Freq: Once | INTRAVENOUS | Status: AC
  Administered 2024-04-08: 40 mg via INTRAVENOUS
  Filled 2024-04-08: qty 10

## 2024-04-08 MED ORDER — PANTOPRAZOLE SODIUM 20 MG PO TBEC: 20.0000 mg | DELAYED_RELEASE_TABLET | Freq: Every day | ORAL | 0 refills | Status: DC

## 2024-04-08 MED ORDER — DICYCLOMINE HCL 20 MG PO TABS: 20.0000 mg | ORAL_TABLET | Freq: Two times a day (BID) | ORAL | 0 refills | Status: DC

## 2024-04-08 NOTE — ED Triage Notes (Signed)
 The pt is c/o abd pain that he has had for 4-5 years  he is c/o more pain today and he is coughing up black  stuff

## 2024-04-08 NOTE — ED Notes (Signed)
 2 attempts made to gain iv access without success.

## 2024-04-08 NOTE — Discharge Instructions (Addendum)
 As discussed, your labs and imaging are reassuring.  Take Protonix  in the morning for the next 2 weeks.  Take Bentyl  twice a day as needed for stomach pains.  Dilatory referral for GI.  They should call in the next several days schedule an appointment.  Follow-up with them for further evaluation with your stomach pains.  Get help right away if: You cannot stop vomiting. Your pain is only in one part of your belly, like on the right side. You have bloody or black poop, or poop that looks like tar. You have trouble breathing. You have chest pain.

## 2024-04-08 NOTE — ED Provider Notes (Signed)
 [place] [facility] DEPARTMENT AT [facility] Provider Note   CSN: [id number] Arrival date & time: 04/08/24  0034     History  Chief Complaint  Patient presents with   Abdominal Pain    Bobby Horn is a 58 y.o. male with history of alcohol abuse, tobacco abuse, and CAD who presents the ED today for abdominal pain.  Patient reports abdominal pain that worsens with eating over the past several years with associated nausea without vomiting.  States that pain has been worsening for the past couple of days and he has also been coughing and "black stuff." Denies any known foods that trigger these symptoms. Endorses right low back pain as well.  No fevers, diarrhea, or dysuria.  Denies history of abdominal surgeries in the past.  No additional complaints or concerns at this time.    Home Medications Prior to Admission medications   Medication Sig Start Date End Date Taking? Authorizing Provider  dicyclomine  (BENTYL ) 20 MG tablet Take 1 tablet (20 mg total) by mouth 2 (two) times daily. 04/08/24 05/08/24 Yes Sonnie Dusky, PA-C  pantoprazole  (PROTONIX ) 20 MG tablet Take 1 tablet (20 mg total) by mouth daily for 14 days. 04/08/24 04/22/24 Yes Sonnie Dusky, PA-C  amLODipine (NORVASC) 10 MG tablet Take 10 mg by mouth daily.    [provider]  amoxicillin  (AMOXIL ) 500 MG capsule Take 1 capsule (500 mg total) by mouth 3 (three) times daily. 12/24/21   Ascencion Lava, DMD  SUBOXONE 8-2 MG FILM Take 1 Film by mouth in the morning, at noon, and at bedtime. 06/25/21   [provider]  metoprolol  (LOPRESSOR ) 50 MG tablet Take 50 mg by mouth 2 (two) times daily. 11/06/11 03/13/12  Harden Leyden, MD      Allergies    Patient has no known allergies.    Review of Systems   Review of Systems  Gastrointestinal:  Positive for abdominal pain.  All other systems reviewed and are negative.   Physical Exam Updated Vital Signs BP (!) 147/106 (BP Location: Left Arm)   Pulse 74    Temp 97.7 F (36.5 C) (Oral)   Resp 19   Ht 6\' 1"  (1.854 m)   Wt 108.9 kg   SpO2 100%   BMI 31.67 kg/m  Physical Exam Vitals and nursing note reviewed.  Constitutional:      General: He is not in acute distress.    Appearance: Normal appearance.  HENT:     Head: Normocephalic and atraumatic.     Mouth/Throat:     Mouth: Mucous membranes are moist.  Eyes:     Conjunctiva/sclera: Conjunctivae normal.     Pupils: Pupils are equal, round, and reactive to light.  Cardiovascular:     Rate and Rhythm: Normal rate and regular rhythm.     Pulses: Normal pulses.     Heart sounds: Normal heart sounds.  Pulmonary:     Effort: Pulmonary effort is normal.     Breath sounds: Normal breath sounds.  Abdominal:     Palpations: Abdomen is soft.     Tenderness: There is abdominal tenderness.     Comments: TTP of the lower abdomen  Musculoskeletal:        General: Tenderness present. Normal range of motion.     Cervical back: Normal range of motion.     Comments: Right lower flank tenderness to palpation without midline tenderness  Skin:    General: Skin is warm and dry.  Findings: No rash.  Neurological:     General: No focal deficit present.     Mental Status: He is alert.  Psychiatric:        Mood and Affect: Mood normal.        Behavior: Behavior normal.    ED Results / Procedures / Treatments   Labs (all labs ordered are listed, but only abnormal results are displayed) Labs Reviewed  COMPREHENSIVE METABOLIC PANEL WITH GFR - Abnormal; Notable for the following components:      Result Value   CO2 21 (*)    Glucose, Bld 143 (*)    Calcium 8.6 (*)    Total Protein 5.9 (*)    Albumin 3.4 (*)    ALT 89 (*)    All other components within normal limits  URINALYSIS, ROUTINE W REFLEX MICROSCOPIC - Abnormal; Notable for the following components:   Color, Urine AMBER (*)    APPearance CLOUDY (*)    Specific Gravity, Urine 1.035 (*)    Ketones, ur 5 (*)    All other components  within normal limits  LIPASE, BLOOD  CBC    EKG None  Radiology CT ABDOMEN PELVIS W CONTRAST Result Date: 04/08/2024 CLINICAL DATA:  Abdominal pain, acute, nonlocalized EXAM: CT ABDOMEN AND PELVIS WITH CONTRAST TECHNIQUE: Multidetector CT imaging of the abdomen and pelvis was performed using the standard protocol following bolus administration of intravenous contrast. RADIATION DOSE REDUCTION: This exam was performed according to the departmental dose-optimization program which includes automated exposure control, adjustment of the mA and/or kV according to patient size and/or use of iterative reconstruction technique. CONTRAST:  75mL OMNIPAQUE  IOHEXOL  350 MG/ML SOLN COMPARISON:  July 01, 2021 FINDINGS: Lower chest: No focal airspace consolidation or pleural effusion.Posterior bibasilar dependent atelectasis. Hepatobiliary: No mass.No radiopaque stones or wall thickening of the gallbladder.No intrahepatic or extrahepatic biliary ductal dilation.The portal veins are patent. Pancreas: No mass or main ductal dilation.No peripancreatic inflammation or fluid collection. Spleen: Normal size. No mass. Adrenals/Urinary Tract: No adrenal masses. No renal mass. No nephrolithiasis or hydronephrosis. The urinary bladder is distended without focal abnormality. Stomach/Bowel: The stomach is decompressed without focal abnormality. No small bowel wall thickening or inflammation. No small bowel obstruction. Normal appendix. Vascular/Lymphatic: No aortic aneurysm. Diffuse aortoiliac atherosclerosis. No intraabdominal or pelvic lymphadenopathy. Reproductive: No prostatomegaly.No free pelvic fluid. Other: No pneumoperitoneum, ascites, or mesenteric inflammation. Musculoskeletal: No acute fracture or destructive lesion.Lumbarization of the S1 vertebral body. L1 through L5 posterior fusion hardware again noted with fractures of both pedicle screws at L5. similar compression fractures of L2-L3. IMPRESSION: No acute  intra-abdominal or pelvic abnormality. Electronically Signed   By: Rance Burrows M.D.   On: 04/08/2024 09:58    Procedures Procedures: not indicated.   Medications Ordered in ED Medications  pantoprazole  (PROTONIX ) injection 40 mg (40 mg Intravenous Given 04/08/24 1045)  iohexol  (OMNIPAQUE ) 350 MG/ML injection 75 mL (75 mLs Intravenous Contrast Given 04/08/24 0824)    ED Course/ Medical Decision Making/ A&P                                 Medical Decision Making Amount and/or Complexity of Data Reviewed Labs: ordered. Radiology: ordered.  Risk Prescription drug management.   This patient presents to the ED for concern of abdominal pain, this involves an extensive number of treatment options, and is a complaint that carries with it a high risk of complications and morbidity.  Differential diagnosis includes: gastritis, gastroenteritis, pancreatitis, cholecystitis, cholangitis, choledocholithiasis, IBS, IBD, diverticulitis, UTI, bowel obstruction, bowel perforation, bowel obstruction, diverticulitis, appendicitis, etc.   Comorbidities  See HPI above   Additional History  Additional history obtained from prior records   Lab Tests  I ordered and personally interpreted labs.  The pertinent results include:   Glucose of 143, ALT 89, otherwise CMP is within normal limits Lipase is unremarkable CBC is unremarkable UA shows ketones but no signs of infection   Imaging Studies  I ordered imaging studies including CT abdomen/pelvis  I independently visualized and interpreted imaging which showed:  No acute intra-abdominal or pelvic abnormalities. I agree with the radiologist interpretation   Problem List / ED Course / Critical Interventions / Medication Management  Patient reports abdominal pain for the past 3 to 4 years.  States that he has pain in his lower abdomen that radiates to the epigastrium with eating.  He has never seen a gastroenterologist for this before.   He has not taken anything for his symptoms.  Endorses nausea, when he gets this pain but denies any vomiting.  No diarrhea or constipation.  No changes to urinary habits.  No fevers.  Denies any prior surgeries to the abdomen. Patient has a history of alcohol and tobacco abuse.  Symptoms could be associated with drinking. Coughing up "black stuff" most likely related to longstanding history of tobacco abuse.  Denies any shortness of breath or chest pains.  Lungs are clear to auscultation.  X-ray of the chest not ordered.  The lower chest visualized on the CT abdomen pelvis shows bibasilar atelectasis without signs of consolidation. I ordered medications including: Protonix  for abdominal pain - given prior to discharge I have reviewed the patients home medicines and have made adjustments as needed   Social Determinants of Health  Tobacco use   Test / Admission - Considered  Discussed findings with patient.  All questions were answered. Ambulatory referral for GI provided. He is stable and safe discharge home. Return precautions given.       Final Clinical Impression(s) / ED Diagnoses Final diagnoses:  Lower abdominal pain    Rx / DC Orders ED Discharge Orders          Ordered    pantoprazole  (PROTONIX ) 20 MG tablet  Daily        04/08/24 1007    dicyclomine  (BENTYL ) 20 MG tablet  2 times daily        04/08/24 1007    Ambulatory referral to Gastroenterology        04/08/24 1010              Sonnie Dusky, PA-C 04/08/24 1049    Russella Courts A, DO 04/09/24 0745

## 2024-04-15 DIAGNOSIS — F1129 Opioid dependence with unspecified opioid-induced disorder: Secondary | ICD-10-CM | POA: Diagnosis not present

## 2024-04-15 DIAGNOSIS — I10 Essential (primary) hypertension: Secondary | ICD-10-CM | POA: Diagnosis not present

## 2024-04-15 DIAGNOSIS — F1123 Opioid dependence with withdrawal: Secondary | ICD-10-CM | POA: Diagnosis not present

## 2024-04-15 DIAGNOSIS — F19980 Other psychoactive substance use, unspecified with psychoactive substance-induced anxiety disorder: Secondary | ICD-10-CM | POA: Diagnosis not present

## 2024-04-19 ENCOUNTER — Encounter: Payer: Self-pay | Admitting: Physician Assistant

## 2024-05-14 DIAGNOSIS — F19980 Other psychoactive substance use, unspecified with psychoactive substance-induced anxiety disorder: Secondary | ICD-10-CM | POA: Diagnosis not present

## 2024-05-14 DIAGNOSIS — F1129 Opioid dependence with unspecified opioid-induced disorder: Secondary | ICD-10-CM | POA: Diagnosis not present

## 2024-05-14 DIAGNOSIS — I10 Essential (primary) hypertension: Secondary | ICD-10-CM | POA: Diagnosis not present

## 2024-05-14 DIAGNOSIS — Z79899 Other long term (current) drug therapy: Secondary | ICD-10-CM | POA: Diagnosis not present

## 2024-05-14 DIAGNOSIS — F1123 Opioid dependence with withdrawal: Secondary | ICD-10-CM | POA: Diagnosis not present

## 2024-06-11 NOTE — Progress Notes (Deleted)
 Ellouise Console, PA-C [address] [place], [place]  [address] Phone: [phone]   Gastroenterology Consultation  Referring Provider:     Waddell Sluder, PA-C Primary Care Physician:  Freddrick, No Primary Gastroenterologist:  Ellouise Console, PA-C / *** Reason for Consultation:     ED F/U Low Abdominal Pain        HPI:   Bobby Horn is a 58 y.o. y/o male referred for consultation & management  by Pcp, No.    Patient went to [facility] ED 04/08/2024 to evaluate abdominal pain.  Abdominal pelvic CT with contrast showed no acute abnormality.  CBC Normal (WBC 8.1, Hgb 16.2).  Urinalysis positive for ketones.  Mild elevated glucose 143, low albumin 3.4, elevated ALT liver test 89.  All other liver test normal.  Low total protein 5.9.  Normal lipase.  He was given pantoprazole  20 Mg once daily and dicyclomine  20 Mg twice daily.  Currently on Suboxone.  Current symptoms: Constipation?  PMH: Alcohol abuse, chronic pain syndrome, atrial fibrillation, hypertension, SVT, tobacco abuse.  Lumbar vertebral fracture s/p lumbar fusion.  No previous colonoscopy, EGD, or GI evaluation.  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 Authorizing Provider  amLODipine (NORVASC) 10 MG tablet Take 10 mg by mouth daily.    [provider]  amoxicillin  (AMOXIL ) 500 MG capsule Take 1 capsule (500 mg total) by mouth 3 (three) times daily. 12/24/21   Sheryle Hamilton, DMD  dicyclomine  (BENTYL ) 20 MG tablet Take 1 tablet (20 mg total) by mouth 2 (two) times daily. 04/08/24 05/08/24  Waddell Sluder, PA-C  pantoprazole  (PROTONIX ) 20 MG tablet Take 1 tablet (20 mg total) by mouth daily for 14 days. 04/08/24 04/22/24  Waddell Sluder, PA-C  SUBOXONE 8-2 MG FILM Take 1 Film by mouth in the morning, at noon, and at bedtime. 06/25/21   [provider]  metoprolol  (LOPRESSOR ) 50 MG tablet Take 50 mg by mouth 2 (two) times daily. 11/06/11 03/13/12  Gabriel Noa, MD    Family History  Problem Relation Age of Onset   Diabetes Mother        No known heart disease, didn't know his father   Coronary artery disease Mother    Other Other        grandmother-hx of palpitations  Social History   Tobacco Use   Smoking status: Every Day    Current packs/day: 0.25    Average packs/day: 0.3 packs/day for 20.0 years (5.0 ttl pk-yrs)    Types: Cigarettes   Smokeless tobacco: Never   Tobacco comments:    Previously 2 ppd, now 0.25 ppd  Vaping Use   Vaping status: Never Used  Substance Use Topics   Alcohol use: Yes    Comment: States he has not drank since the ablation in 2011   Drug use: No    Allergies as of 06/12/2024   (No Known Allergies)    Review of Systems:    All systems reviewed and negative except where noted in HPI.   Physical Exam:  There were no vitals taken for this visit. No LMP for male patient.  General:   Alert,  Well-developed,  well-nourished, pleasant and cooperative in NAD Lungs:  Respirations even and unlabored.  Clear throughout to auscultation.   No wheezes, crackles, or rhonchi. No acute distress. Heart:  Regular rate and rhythm; no murmurs, clicks, rubs, or gallops. Abdomen:  Normal bowel sounds.  No bruits.  Soft, and non-distended without masses, hepatosplenomegaly or hernias noted.  No Tenderness.  No guarding or rebound tenderness.    Neurologic:  Alert and oriented x3;  grossly normal neurologically. Psych:  Alert and cooperative. Normal mood and affect.  Imaging Studies: No results found.  Labs: CBC    Component Value Date/Time   WBC 8.1 04/08/2024 0106   RBC 4.82 04/08/2024 0106   HGB 16.2 04/08/2024 0106   HCT 47.5 04/08/2024 0106   PLT 216 04/08/2024 0106   MCV 98.5 04/08/2024 0106   MCH 33.6 04/08/2024 0106   MCHC 34.1 04/08/2024 0106   RDW 13.3 04/08/2024 0106   LYMPHSABS 2.3 07/01/2021 0443   MONOABS 0.7 07/01/2021 0443   EOSABS 0.4 07/01/2021 0443   BASOSABS 0.1 07/01/2021 0443    CMP     Component Value Date/Time   NA 137 04/08/2024 0106   K 3.7 04/08/2024 0106   CL 106 04/08/2024 0106   CO2 21 (L) 04/08/2024 0106   GLUCOSE 143 (H) 04/08/2024 0106   BUN 18 04/08/2024 0106   CREATININE 1.23 04/08/2024 0106   CALCIUM 8.6 (L) 04/08/2024 0106   PROT 5.9 (L) 04/08/2024 0106   ALBUMIN 3.4 (L) 04/08/2024 0106   AST 33 04/08/2024 0106   ALT 89 (H) 04/08/2024 0106   ALKPHOS 71 04/08/2024 0106   BILITOT 0.6 04/08/2024 0106   GFRNONAA >60 04/08/2024 0106   GFRAA >60 02/27/2020 0404    Assessment and Plan:   Bobby Horn is a 58 y.o. y/o male has been referred for ***  Follow up ***  Ellouise Console, PA-C

## 2024-06-12 ENCOUNTER — Ambulatory Visit: Admitting: Physician Assistant

## 2024-06-14 DIAGNOSIS — I10 Essential (primary) hypertension: Secondary | ICD-10-CM | POA: Diagnosis not present

## 2024-06-14 DIAGNOSIS — F1129 Opioid dependence with unspecified opioid-induced disorder: Secondary | ICD-10-CM | POA: Diagnosis not present

## 2024-06-14 DIAGNOSIS — F19980 Other psychoactive substance use, unspecified with psychoactive substance-induced anxiety disorder: Secondary | ICD-10-CM | POA: Diagnosis not present

## 2024-06-14 DIAGNOSIS — F1123 Opioid dependence with withdrawal: Secondary | ICD-10-CM | POA: Diagnosis not present

## 2024-06-14 DIAGNOSIS — Z79899 Other long term (current) drug therapy: Secondary | ICD-10-CM | POA: Diagnosis not present

## 2024-07-10 DIAGNOSIS — F1129 Opioid dependence with unspecified opioid-induced disorder: Secondary | ICD-10-CM | POA: Diagnosis not present

## 2024-07-10 DIAGNOSIS — F1123 Opioid dependence with withdrawal: Secondary | ICD-10-CM | POA: Diagnosis not present

## 2024-07-10 DIAGNOSIS — Z79899 Other long term (current) drug therapy: Secondary | ICD-10-CM | POA: Diagnosis not present

## 2024-07-10 DIAGNOSIS — F19980 Other psychoactive substance use, unspecified with psychoactive substance-induced anxiety disorder: Secondary | ICD-10-CM | POA: Diagnosis not present

## 2024-08-08 ENCOUNTER — Emergency Department (HOSPITAL_COMMUNITY)
Admission: EM | Admit: 2024-08-08 | Discharge: 2024-08-09 | Disposition: A | Discharge status: Home / Self Care | Attending: Emergency Medicine | Admitting: Emergency Medicine

## 2024-08-08 ENCOUNTER — Other Ambulatory Visit: Payer: Self-pay

## 2024-08-08 DIAGNOSIS — M722 Plantar fascial fibromatosis: Secondary | ICD-10-CM | POA: Diagnosis not present

## 2024-08-08 DIAGNOSIS — M79672 Pain in left foot: Secondary | ICD-10-CM | POA: Diagnosis not present

## 2024-08-08 DIAGNOSIS — M19011 Primary osteoarthritis, right shoulder: Secondary | ICD-10-CM | POA: Diagnosis not present

## 2024-08-08 DIAGNOSIS — M25511 Pain in right shoulder: Secondary | ICD-10-CM | POA: Insufficient documentation

## 2024-08-08 NOTE — ED Notes (Signed)
 Patient going outside.
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
## 2024-08-09 ENCOUNTER — Emergency Department (HOSPITAL_COMMUNITY)

## 2024-08-09 DIAGNOSIS — M79672 Pain in left foot: Secondary | ICD-10-CM | POA: Diagnosis not present

## 2024-08-09 DIAGNOSIS — M19011 Primary osteoarthritis, right shoulder: Secondary | ICD-10-CM | POA: Diagnosis not present

## 2024-08-09 DIAGNOSIS — M25511 Pain in right shoulder: Secondary | ICD-10-CM | POA: Diagnosis not present

## 2024-08-09 MED ORDER — IBUPROFEN 800 MG PO TABS: 800.0000 mg | ORAL_TABLET | Freq: Three times a day (TID) | ORAL | 0 refills | Status: DC

## 2024-08-09 MED ORDER — OXYCODONE-ACETAMINOPHEN 5-325 MG PO TABS
2.0000 | ORAL_TABLET | Freq: Once | ORAL | Status: AC
  Administered 2024-08-09: 2 via ORAL
  Filled 2024-08-09: qty 2

## 2024-08-09 NOTE — ED Notes (Signed)
 Patient did not answer. Patient went outside and did not come back in this time.

## 2024-08-09 NOTE — ED Notes (Signed)
 Called patient name 3x no answer
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
## 2024-08-09 NOTE — Progress Notes (Signed)
 Orthopedic Tech Progress Note Patient Details:  Bobby Horn 07-30-1966 [id number]  Ortho Devices Type of Ortho Device: CAM walker Ortho Device/Splint Location: LLE Ortho Device/Splint Interventions: Ordered, Application   Post Interventions Patient Tolerated: Well Instructions Provided: Care of device   [name] L Tally Mattox 08/09/2024, 4:08 AM

## 2024-08-09 NOTE — ED Notes (Signed)
 Patient never returned back to lobby.

## 2024-08-23 DIAGNOSIS — F19980 Other psychoactive substance use, unspecified with psychoactive substance-induced anxiety disorder: Secondary | ICD-10-CM | POA: Diagnosis not present

## 2024-08-23 DIAGNOSIS — F1129 Opioid dependence with unspecified opioid-induced disorder: Secondary | ICD-10-CM | POA: Diagnosis not present

## 2024-08-23 DIAGNOSIS — F1123 Opioid dependence with withdrawal: Secondary | ICD-10-CM | POA: Diagnosis not present

## 2024-08-23 DIAGNOSIS — Z79899 Other long term (current) drug therapy: Secondary | ICD-10-CM | POA: Diagnosis not present

## 2024-09-20 DIAGNOSIS — Z6828 Body mass index (BMI) 28.0-28.9, adult: Secondary | ICD-10-CM | POA: Diagnosis not present

## 2024-09-20 DIAGNOSIS — F19980 Other psychoactive substance use, unspecified with psychoactive substance-induced anxiety disorder: Secondary | ICD-10-CM | POA: Diagnosis not present

## 2024-09-20 DIAGNOSIS — Z79899 Other long term (current) drug therapy: Secondary | ICD-10-CM | POA: Diagnosis not present

## 2024-09-20 DIAGNOSIS — F1123 Opioid dependence with withdrawal: Secondary | ICD-10-CM | POA: Diagnosis not present

## 2024-09-20 DIAGNOSIS — F1129 Opioid dependence with unspecified opioid-induced disorder: Secondary | ICD-10-CM | POA: Diagnosis not present

## 2024-10-20 ENCOUNTER — Emergency Department (HOSPITAL_COMMUNITY)

## 2024-10-20 ENCOUNTER — Encounter (HOSPITAL_COMMUNITY)
Admission: EM | Disposition: A | Discharge status: Home / Self Care | Payer: Self-pay | Source: Home / Self Care | Attending: Family Medicine

## 2024-10-20 ENCOUNTER — Inpatient Hospital Stay (HOSPITAL_COMMUNITY)
Admission: EM | Admit: 2024-10-20 | Discharge: 2024-10-22 | DRG: 280 | Disposition: A | Discharge status: Home / Self Care | Attending: Family Medicine | Admitting: Family Medicine

## 2024-10-20 ENCOUNTER — Other Ambulatory Visit: Payer: Self-pay

## 2024-10-20 ENCOUNTER — Encounter (HOSPITAL_COMMUNITY): Payer: Self-pay

## 2024-10-20 DIAGNOSIS — J9601 Acute respiratory failure with hypoxia: Secondary | ICD-10-CM | POA: Diagnosis not present

## 2024-10-20 DIAGNOSIS — I5021 Acute systolic (congestive) heart failure: Secondary | ICD-10-CM | POA: Diagnosis not present

## 2024-10-20 DIAGNOSIS — F1010 Alcohol abuse, uncomplicated: Secondary | ICD-10-CM | POA: Diagnosis not present

## 2024-10-20 DIAGNOSIS — Z716 Tobacco abuse counseling: Secondary | ICD-10-CM | POA: Diagnosis not present

## 2024-10-20 DIAGNOSIS — K219 Gastro-esophageal reflux disease without esophagitis: Secondary | ICD-10-CM | POA: Diagnosis present

## 2024-10-20 DIAGNOSIS — D751 Secondary polycythemia: Secondary | ICD-10-CM | POA: Diagnosis not present

## 2024-10-20 DIAGNOSIS — I517 Cardiomegaly: Secondary | ICD-10-CM | POA: Diagnosis not present

## 2024-10-20 DIAGNOSIS — M109 Gout, unspecified: Secondary | ICD-10-CM | POA: Diagnosis not present

## 2024-10-20 DIAGNOSIS — Z833 Family history of diabetes mellitus: Secondary | ICD-10-CM

## 2024-10-20 DIAGNOSIS — F17290 Nicotine dependence, other tobacco product, uncomplicated: Secondary | ICD-10-CM | POA: Diagnosis present

## 2024-10-20 DIAGNOSIS — Z720 Tobacco use: Secondary | ICD-10-CM

## 2024-10-20 DIAGNOSIS — I252 Old myocardial infarction: Secondary | ICD-10-CM

## 2024-10-20 DIAGNOSIS — Z79890 Hormone replacement therapy (postmenopausal): Secondary | ICD-10-CM | POA: Diagnosis not present

## 2024-10-20 DIAGNOSIS — I493 Ventricular premature depolarization: Secondary | ICD-10-CM | POA: Diagnosis present

## 2024-10-20 DIAGNOSIS — I509 Heart failure, unspecified: Secondary | ICD-10-CM

## 2024-10-20 DIAGNOSIS — Z7902 Long term (current) use of antithrombotics/antiplatelets: Secondary | ICD-10-CM | POA: Diagnosis not present

## 2024-10-20 DIAGNOSIS — M1990 Unspecified osteoarthritis, unspecified site: Secondary | ICD-10-CM | POA: Diagnosis present

## 2024-10-20 DIAGNOSIS — Z87442 Personal history of urinary calculi: Secondary | ICD-10-CM

## 2024-10-20 DIAGNOSIS — I2510 Atherosclerotic heart disease of native coronary artery without angina pectoris: Secondary | ICD-10-CM | POA: Diagnosis present

## 2024-10-20 DIAGNOSIS — F1490 Cocaine use, unspecified, uncomplicated: Secondary | ICD-10-CM | POA: Diagnosis present

## 2024-10-20 DIAGNOSIS — Z789 Other specified health status: Secondary | ICD-10-CM

## 2024-10-20 DIAGNOSIS — I214 Non-ST elevation (NSTEMI) myocardial infarction: Secondary | ICD-10-CM

## 2024-10-20 DIAGNOSIS — Z96652 Presence of left artificial knee joint: Secondary | ICD-10-CM | POA: Diagnosis present

## 2024-10-20 DIAGNOSIS — R079 Chest pain, unspecified: Secondary | ICD-10-CM | POA: Diagnosis not present

## 2024-10-20 DIAGNOSIS — I480 Paroxysmal atrial fibrillation: Secondary | ICD-10-CM | POA: Diagnosis not present

## 2024-10-20 DIAGNOSIS — I4719 Other supraventricular tachycardia: Secondary | ICD-10-CM | POA: Diagnosis not present

## 2024-10-20 DIAGNOSIS — Z8249 Family history of ischemic heart disease and other diseases of the circulatory system: Secondary | ICD-10-CM | POA: Diagnosis not present

## 2024-10-20 DIAGNOSIS — Z79899 Other long term (current) drug therapy: Secondary | ICD-10-CM

## 2024-10-20 DIAGNOSIS — R0602 Shortness of breath: Secondary | ICD-10-CM | POA: Diagnosis not present

## 2024-10-20 DIAGNOSIS — R918 Other nonspecific abnormal finding of lung field: Secondary | ICD-10-CM | POA: Diagnosis not present

## 2024-10-20 DIAGNOSIS — G894 Chronic pain syndrome: Secondary | ICD-10-CM | POA: Diagnosis not present

## 2024-10-20 DIAGNOSIS — Z7982 Long term (current) use of aspirin: Secondary | ICD-10-CM

## 2024-10-20 DIAGNOSIS — N179 Acute kidney failure, unspecified: Secondary | ICD-10-CM | POA: Diagnosis not present

## 2024-10-20 DIAGNOSIS — F419 Anxiety disorder, unspecified: Secondary | ICD-10-CM | POA: Diagnosis present

## 2024-10-20 DIAGNOSIS — I110 Hypertensive heart disease with heart failure: Secondary | ICD-10-CM | POA: Diagnosis present

## 2024-10-20 DIAGNOSIS — I5022 Chronic systolic (congestive) heart failure: Secondary | ICD-10-CM | POA: Diagnosis not present

## 2024-10-20 HISTORY — PX: LEFT HEART CATH AND CORONARY ANGIOGRAPHY: CATH118249

## 2024-10-20 LAB — ECHOCARDIOGRAM COMPLETE
Area-P 1/2: 6.32 cm2
Height: 75 in
S' Lateral: 4.8 cm
Weight: 3536 [oz_av]

## 2024-10-20 LAB — BASIC METABOLIC PANEL WITH GFR
Anion gap: 14 (ref 5–15)
BUN: 20 mg/dL (ref 6–20)
CO2: 20 mmol/L — ABNORMAL LOW (ref 22–32)
Calcium: 8.6 mg/dL — ABNORMAL LOW (ref 8.9–10.3)
Chloride: 103 mmol/L (ref 98–111)
Creatinine, Ser: 1.5 mg/dL — ABNORMAL HIGH (ref 0.61–1.24)
GFR, Estimated: 54 mL/min — ABNORMAL LOW (ref >60)
Glucose, Bld: 120 mg/dL — ABNORMAL HIGH (ref 70–99)
Potassium: 4.6 mmol/L (ref 3.5–5.1)
Sodium: 137 mmol/L (ref 135–145)

## 2024-10-20 LAB — HEPATIC FUNCTION PANEL
ALT: 22 U/L (ref 0–44)
AST: 49 U/L — ABNORMAL HIGH (ref 15–41)
Albumin: 3.6 g/dL (ref 3.5–5.0)
Alkaline Phosphatase: 73 U/L (ref 38–126)
Bilirubin, Direct: 0.3 mg/dL — ABNORMAL HIGH (ref 0.0–0.2)
Indirect Bilirubin: 1.2 mg/dL — ABNORMAL HIGH (ref 0.3–0.9)
Total Bilirubin: 1.5 mg/dL — ABNORMAL HIGH (ref 0.0–1.2)
Total Protein: 7 g/dL (ref 6.5–8.1)

## 2024-10-20 LAB — TROPONIN I (HIGH SENSITIVITY)
Troponin I (High Sensitivity): 1655 ng/L (ref <18)
Troponin I (High Sensitivity): 1520 ng/L
Troponin I (High Sensitivity): 4132 ng/L (ref <18)
Troponin I (High Sensitivity): 3595 ng/L (ref <18)

## 2024-10-20 LAB — I-STAT CHEM 8, ED
BUN: 25 mg/dL — ABNORMAL HIGH (ref 6–20)
Calcium, Ion: 0.99 mmol/L — ABNORMAL LOW (ref 1.15–1.40)
Chloride: 103 mmol/L (ref 98–111)
Creatinine, Ser: 1.6 mg/dL — ABNORMAL HIGH (ref 0.61–1.24)
Glucose, Bld: 120 mg/dL — ABNORMAL HIGH (ref 70–99)
HCT: 65 % — ABNORMAL HIGH (ref 39.0–52.0)
Hemoglobin: 22.1 g/dL (ref 13.0–17.0)
Potassium: 4.5 mmol/L (ref 3.5–5.1)
Sodium: 136 mmol/L (ref 135–145)
TCO2: 21 mmol/L — ABNORMAL LOW (ref 22–32)

## 2024-10-20 LAB — RAPID URINE DRUG SCREEN, HOSP PERFORMED
Amphetamines: NOT DETECTED
Barbiturates: NOT DETECTED
Benzodiazepines: NOT DETECTED
Cocaine: POSITIVE — AB
Opiates: NOT DETECTED
Tetrahydrocannabinol: NOT DETECTED

## 2024-10-20 LAB — CBC
HCT: 62.9 % — ABNORMAL HIGH (ref 39.0–52.0)
Hemoglobin: 21.6 g/dL (ref 13.0–17.0)
MCH: 34.7 pg — ABNORMAL HIGH (ref 26.0–34.0)
MCHC: 34.3 g/dL (ref 30.0–36.0)
MCV: 101 fL — ABNORMAL HIGH (ref 80.0–100.0)
Platelets: 205 K/uL (ref 150–400)
RBC: 6.23 MIL/uL — ABNORMAL HIGH (ref 4.22–5.81)
RDW: 12.7 % (ref 11.5–15.5)
WBC: 13.3 K/uL — ABNORMAL HIGH (ref 4.0–10.5)
nRBC: 0 % (ref 0.0–0.2)

## 2024-10-20 LAB — GLUCOSE, CAPILLARY: Glucose-Capillary: 121 mg/dL — ABNORMAL HIGH (ref 70–99)

## 2024-10-20 LAB — HEMOGLOBIN AND HEMATOCRIT, BLOOD
HCT: 55.5 % — ABNORMAL HIGH (ref 39.0–52.0)
Hemoglobin: 19.1 g/dL — ABNORMAL HIGH (ref 13.0–17.0)

## 2024-10-20 LAB — HEPARIN LEVEL (UNFRACTIONATED): Heparin Unfractionated: 0.18 [IU]/mL — ABNORMAL LOW (ref 0.30–0.70)

## 2024-10-20 LAB — HIV ANTIBODY (ROUTINE TESTING W REFLEX): HIV Screen 4th Generation wRfx: NONREACTIVE

## 2024-10-20 LAB — CK: Total CK: 395 U/L (ref 49–397)

## 2024-10-20 LAB — BRAIN NATRIURETIC PEPTIDE: B Natriuretic Peptide: 921.4 pg/mL — ABNORMAL HIGH (ref 0.0–100.0)

## 2024-10-20 SURGERY — LEFT HEART CATH AND CORONARY ANGIOGRAPHY
Anesthesia: LOCAL

## 2024-10-20 MED ORDER — LORAZEPAM 1 MG PO TABS
1.0000 mg | ORAL_TABLET | ORAL | Status: DC | PRN
  Administered 2024-10-20 – 2024-10-21 (×2): 1 mg via ORAL
  Administered 2024-10-22: 2 mg via ORAL
  Filled 2024-10-20: qty 3
  Filled 2024-10-20: qty 1
  Filled 2024-10-20: qty 2

## 2024-10-20 MED ORDER — FENTANYL CITRATE (PF) 100 MCG/2ML IJ SOLN
INTRAMUSCULAR | Status: DC | PRN
  Administered 2024-10-20: 25 ug via INTRAVENOUS

## 2024-10-20 MED ORDER — ASPIRIN 81 MG PO CHEW
81.0000 mg | CHEWABLE_TABLET | Freq: Every day | ORAL | Status: DC
  Administered 2024-10-21 – 2024-10-22 (×2): 81 mg via ORAL
  Filled 2024-10-20 (×2): qty 1

## 2024-10-20 MED ORDER — CHLORDIAZEPOXIDE HCL 25 MG PO CAPS: 25.0000 mg | ORAL_CAPSULE | ORAL | Status: DC

## 2024-10-20 MED ORDER — SODIUM CHLORIDE 0.9 % IV SOLN: 250.0000 mL | INTRAVENOUS | Status: AC | PRN

## 2024-10-20 MED ORDER — FREE WATER
500.0000 mL | Freq: Once | Status: AC
  Administered 2024-10-20: 500 mL via ORAL

## 2024-10-20 MED ORDER — FOLIC ACID 1 MG PO TABS
1.0000 mg | ORAL_TABLET | Freq: Every day | ORAL | Status: DC
  Administered 2024-10-20 – 2024-10-22 (×3): 1 mg via ORAL
  Filled 2024-10-20 (×3): qty 1

## 2024-10-20 MED ORDER — HEPARIN (PORCINE) 25000 UT/250ML-% IV SOLN
1300.0000 [IU]/h | INTRAVENOUS | Status: DC
  Administered 2024-10-20: 1300 [IU]/h via INTRAVENOUS
  Filled 2024-10-20: qty 250

## 2024-10-20 MED ORDER — FUROSEMIDE 10 MG/ML IJ SOLN
80.0000 mg | Freq: Once | INTRAMUSCULAR | Status: AC
  Administered 2024-10-20: 80 mg via INTRAVENOUS
  Filled 2024-10-20: qty 8

## 2024-10-20 MED ORDER — ACETAMINOPHEN 650 MG RE SUPP: 650.0000 mg | Freq: Four times a day (QID) | RECTAL | Status: DC | PRN

## 2024-10-20 MED ORDER — CHLORDIAZEPOXIDE HCL 25 MG PO CAPS: 25.0000 mg | ORAL_CAPSULE | Freq: Three times a day (TID) | ORAL | Status: DC

## 2024-10-20 MED ORDER — LORAZEPAM 2 MG/ML IJ SOLN
1.0000 mg | INTRAMUSCULAR | Status: DC | PRN
  Administered 2024-10-20: 2 mg via INTRAVENOUS
  Administered 2024-10-21 (×2): 3 mg via INTRAVENOUS
  Filled 2024-10-20: qty 2
  Filled 2024-10-20: qty 1
  Filled 2024-10-20: qty 2

## 2024-10-20 MED ORDER — ONDANSETRON HCL 4 MG PO TABS: 4.0000 mg | ORAL_TABLET | Freq: Four times a day (QID) | ORAL | Status: DC | PRN

## 2024-10-20 MED ORDER — CHLORDIAZEPOXIDE HCL 25 MG PO CAPS
25.0000 mg | ORAL_CAPSULE | Freq: Four times a day (QID) | ORAL | Status: AC
  Administered 2024-10-20 – 2024-10-21 (×5): 25 mg via ORAL
  Filled 2024-10-20 (×5): qty 1

## 2024-10-20 MED ORDER — HEPARIN SODIUM (PORCINE) 1000 UNIT/ML IJ SOLN
INTRAMUSCULAR | Status: DC | PRN
  Administered 2024-10-20: 5000 [IU] via INTRA_ARTERIAL

## 2024-10-20 MED ORDER — ASPIRIN 325 MG PO TBEC
325.0000 mg | DELAYED_RELEASE_TABLET | Freq: Once | ORAL | Status: AC
  Administered 2024-10-20: 325 mg via ORAL
  Filled 2024-10-20: qty 1

## 2024-10-20 MED ORDER — FENTANYL CITRATE (PF) 50 MCG/ML IJ SOSY
50.0000 ug | PREFILLED_SYRINGE | Freq: Once | INTRAMUSCULAR | Status: AC
  Administered 2024-10-20: 50 ug via INTRAVENOUS
  Filled 2024-10-20: qty 1

## 2024-10-20 MED ORDER — ADULT MULTIVITAMIN W/MINERALS CH
1.0000 | ORAL_TABLET | Freq: Every day | ORAL | Status: DC
  Administered 2024-10-20 – 2024-10-22 (×3): 1 via ORAL
  Filled 2024-10-20 (×3): qty 1

## 2024-10-20 MED ORDER — LABETALOL HCL 100 MG PO TABS
100.0000 mg | ORAL_TABLET | Freq: Two times a day (BID) | ORAL | Status: DC
  Administered 2024-10-20: 100 mg via ORAL
  Filled 2024-10-20 (×3): qty 1

## 2024-10-20 MED ORDER — CLOPIDOGREL BISULFATE 75 MG PO TABS
75.0000 mg | ORAL_TABLET | Freq: Every day | ORAL | Status: DC
Start: 2024-10-21 — End: 2024-10-22
  Administered 2024-10-21 – 2024-10-22 (×2): 75 mg via ORAL
  Filled 2024-10-20 (×2): qty 1

## 2024-10-20 MED ORDER — LIDOCAINE HCL (PF) 1 % IJ SOLN
INTRAMUSCULAR | Status: AC
  Filled 2024-10-20: qty 30

## 2024-10-20 MED ORDER — ISOSORBIDE MONONITRATE ER 30 MG PO TB24
30.0000 mg | ORAL_TABLET | Freq: Every day | ORAL | Status: DC
  Administered 2024-10-20: 30 mg via ORAL
  Filled 2024-10-20: qty 1

## 2024-10-20 MED ORDER — VERAPAMIL HCL 2.5 MG/ML IV SOLN
INTRAVENOUS | Status: AC
  Filled 2024-10-20: qty 2

## 2024-10-20 MED ORDER — HEPARIN (PORCINE) IN NACL 1000-0.9 UT/500ML-% IV SOLN
INTRAVENOUS | Status: DC | PRN
  Administered 2024-10-20: 1000 mL

## 2024-10-20 MED ORDER — VERAPAMIL HCL 2.5 MG/ML IV SOLN
INTRAVENOUS | Status: DC | PRN
  Administered 2024-10-20: 5 mL via INTRA_ARTERIAL

## 2024-10-20 MED ORDER — BUPRENORPHINE HCL-NALOXONE HCL 8-2 MG SL SUBL
1.0000 | SUBLINGUAL_TABLET | Freq: Every day | SUBLINGUAL | Status: DC
  Administered 2024-10-20 – 2024-10-22 (×3): 1 via SUBLINGUAL
  Filled 2024-10-20 (×3): qty 1

## 2024-10-20 MED ORDER — ONDANSETRON HCL 4 MG/2ML IJ SOLN
4.0000 mg | Freq: Four times a day (QID) | INTRAMUSCULAR | Status: DC | PRN
  Administered 2024-10-21: 4 mg via INTRAVENOUS
  Filled 2024-10-20: qty 2

## 2024-10-20 MED ORDER — ATORVASTATIN CALCIUM 80 MG PO TABS
80.0000 mg | ORAL_TABLET | Freq: Every day | ORAL | Status: DC
Start: 2024-10-21 — End: 2024-10-22
  Administered 2024-10-21 – 2024-10-22 (×2): 80 mg via ORAL
  Filled 2024-10-20 (×2): qty 1

## 2024-10-20 MED ORDER — FENTANYL CITRATE (PF) 100 MCG/2ML IJ SOLN
INTRAMUSCULAR | Status: AC
  Filled 2024-10-20: qty 2

## 2024-10-20 MED ORDER — CHLORDIAZEPOXIDE HCL 25 MG PO CAPS
25.0000 mg | ORAL_CAPSULE | Freq: Four times a day (QID) | ORAL | Status: DC
  Administered 2024-10-20: 25 mg via ORAL
  Filled 2024-10-20: qty 1

## 2024-10-20 MED ORDER — HYDRALAZINE HCL 20 MG/ML IJ SOLN: 10.0000 mg | INTRAMUSCULAR | Status: DC | PRN

## 2024-10-20 MED ORDER — SODIUM CHLORIDE 0.9% FLUSH
3.0000 mL | Freq: Two times a day (BID) | INTRAVENOUS | Status: DC
  Administered 2024-10-20 – 2024-10-22 (×3): 3 mL via INTRAVENOUS

## 2024-10-20 MED ORDER — LABETALOL HCL 100 MG PO TABS: 100.0000 mg | ORAL_TABLET | Freq: Once | ORAL | Status: DC

## 2024-10-20 MED ORDER — CHLORDIAZEPOXIDE HCL 25 MG PO CAPS
25.0000 mg | ORAL_CAPSULE | Freq: Three times a day (TID) | ORAL | Status: DC
  Administered 2024-10-22: 25 mg via ORAL
  Filled 2024-10-20: qty 1

## 2024-10-20 MED ORDER — FENTANYL CITRATE (PF) 50 MCG/ML IJ SOSY: 50.0000 ug | PREFILLED_SYRINGE | Freq: Once | INTRAMUSCULAR | Status: DC

## 2024-10-20 MED ORDER — ASPIRIN 81 MG PO CHEW: 81.0000 mg | CHEWABLE_TABLET | Freq: Every day | ORAL | Status: DC

## 2024-10-20 MED ORDER — NITROGLYCERIN IN D5W 200-5 MCG/ML-% IV SOLN
0.0000 ug/min | INTRAVENOUS | Status: DC
  Administered 2024-10-20: 10 ug/min via INTRAVENOUS
  Filled 2024-10-20: qty 250

## 2024-10-20 MED ORDER — THIAMINE HCL 100 MG/ML IJ SOLN
100.0000 mg | Freq: Every day | INTRAMUSCULAR | Status: DC
  Filled 2024-10-20: qty 2

## 2024-10-20 MED ORDER — LIDOCAINE HCL (PF) 1 % IJ SOLN
INTRAMUSCULAR | Status: DC | PRN
Start: 2024-10-20 — End: 2024-10-20
  Administered 2024-10-20: 2 mL via INTRADERMAL

## 2024-10-20 MED ORDER — FUROSEMIDE 10 MG/ML IJ SOLN
10.0000 mg/h | INTRAVENOUS | Status: DC
  Administered 2024-10-20: 10 mg/h via INTRAVENOUS
  Filled 2024-10-20: qty 20

## 2024-10-20 MED ORDER — ACETAMINOPHEN 325 MG PO TABS
650.0000 mg | ORAL_TABLET | Freq: Four times a day (QID) | ORAL | Status: DC | PRN
Start: 2024-10-20 — End: 2024-10-22
  Administered 2024-10-20 – 2024-10-21 (×2): 650 mg via ORAL
  Filled 2024-10-20 (×2): qty 2

## 2024-10-20 MED ORDER — HEPARIN BOLUS VIA INFUSION
4000.0000 [IU] | Freq: Once | INTRAVENOUS | Status: AC
  Administered 2024-10-20: 4000 [IU] via INTRAVENOUS
  Filled 2024-10-20: qty 4000

## 2024-10-20 MED ORDER — MORPHINE SULFATE (PF) 2 MG/ML IV SOLN
1.0000 mg | INTRAVENOUS | Status: DC | PRN
  Administered 2024-10-20: 1 mg via INTRAVENOUS
  Filled 2024-10-20: qty 1

## 2024-10-20 MED ORDER — CHLORDIAZEPOXIDE HCL 25 MG PO CAPS: 25.0000 mg | ORAL_CAPSULE | Freq: Every day | ORAL | Status: DC

## 2024-10-20 MED ORDER — THIAMINE MONONITRATE 100 MG PO TABS
100.0000 mg | ORAL_TABLET | Freq: Every day | ORAL | Status: DC
  Administered 2024-10-20 – 2024-10-22 (×3): 100 mg via ORAL
  Filled 2024-10-20 (×3): qty 1

## 2024-10-20 MED ORDER — HEPARIN SODIUM (PORCINE) 1000 UNIT/ML IJ SOLN
INTRAMUSCULAR | Status: AC
Start: 2024-10-20 — End: 2024-10-20
  Filled 2024-10-20: qty 10

## 2024-10-20 MED ORDER — IOHEXOL 350 MG/ML SOLN
INTRAVENOUS | Status: DC | PRN
  Administered 2024-10-20: 60 mL

## 2024-10-20 MED ORDER — MIDAZOLAM HCL (PF) 2 MG/2ML IJ SOLN
INTRAMUSCULAR | Status: DC | PRN
  Administered 2024-10-20: 1 mg via INTRAVENOUS

## 2024-10-20 MED ORDER — SODIUM CHLORIDE 0.9% FLUSH
3.0000 mL | INTRAVENOUS | Status: DC | PRN
Start: 2024-10-20 — End: 2024-10-22

## 2024-10-20 MED ORDER — CHLORHEXIDINE GLUCONATE CLOTH 2 % EX PADS
6.0000 | MEDICATED_PAD | Freq: Every day | CUTANEOUS | Status: DC
Start: 2024-10-21 — End: 2024-10-22
  Administered 2024-10-21: 6 via TOPICAL

## 2024-10-20 MED ORDER — SENNA 8.6 MG PO TABS
1.0000 | ORAL_TABLET | Freq: Two times a day (BID) | ORAL | Status: DC
  Administered 2024-10-20 – 2024-10-21 (×3): 8.6 mg via ORAL
  Filled 2024-10-20 (×4): qty 1

## 2024-10-20 MED ORDER — SODIUM CHLORIDE 0.9 % IV BOLUS
500.0000 mL | Freq: Once | INTRAVENOUS | Status: AC
  Administered 2024-10-20: 500 mL via INTRAVENOUS

## 2024-10-20 MED ORDER — LABETALOL HCL 5 MG/ML IV SOLN: 10.0000 mg | INTRAVENOUS | Status: DC | PRN

## 2024-10-20 MED ORDER — ATORVASTATIN CALCIUM 40 MG PO TABS
40.0000 mg | ORAL_TABLET | Freq: Every day | ORAL | Status: DC
  Administered 2024-10-20: 40 mg via ORAL
  Filled 2024-10-20: qty 1

## 2024-10-20 MED ORDER — MIDAZOLAM HCL 2 MG/2ML IJ SOLN
INTRAMUSCULAR | Status: AC
Start: 2024-10-20 — End: 2024-10-20
  Filled 2024-10-20: qty 2

## 2024-10-20 MED ORDER — FENTANYL CITRATE (PF) 50 MCG/ML IJ SOSY: 25.0000 ug | PREFILLED_SYRINGE | Freq: Once | INTRAMUSCULAR | Status: DC

## 2024-10-20 SURGICAL SUPPLY — 11 items
CATH INFINITI 5FR ANG PIGTAIL (CATHETERS) IMPLANT
CATH INFINITI AMBI 6FR TG (CATHETERS) IMPLANT
DEVICE RAD COMP TR BAND LRG (VASCULAR PRODUCTS) IMPLANT
GLIDESHEATH SLEND SS 6F .021 (SHEATH) IMPLANT
KIT ENCORE 26 ADVANTAGE (KITS) IMPLANT
KIT HEMO VALVE WATCHDOG (MISCELLANEOUS) IMPLANT
PACK CARDIAC CATHETERIZATION (CUSTOM PROCEDURE TRAY) ×1 IMPLANT
SET ATX-X65L (MISCELLANEOUS) IMPLANT
SHEATH PROBE COVER 6X72 (BAG) IMPLANT
STATION PROTECTION PRESSURIZED (MISCELLANEOUS) IMPLANT
WIRE EMERALD 3MM-J .035X260CM (WIRE) IMPLANT

## 2024-10-20 NOTE — Plan of Care (Cosign Needed)
 FMTS Progress Note  S: Bobby Horn is a 58 y.o. male presenting with chest pain and SOB w/ EKG c/o NSTEMI. Pt w/ worsening chest pain, agitation, and HA. FMTS   O: BP (!) 137/95   Pulse (!) 115   Temp 99.1 F (37.3 C) (Oral)   Resp (!) 25   Ht 6' 3 (1.905 m)   Wt 100.2 kg   SpO2 98%   BMI 27.62 kg/m     A/P: #Alcohol withdrawn  CIWA score of 18.  - Continue CIWA protocol  - Ativan  1-4 mg Q1H PRN  #Chest pain - Continue nitroglycerin  gtt and titrate per cardiology rec - Continue Heparin  gtt - Adding Morphine  1 mg IV Q4H PRN   Suzen Houston NOVAK, DO 10/20/2024, 5:22 PM PGY-1, [facility] Family Medicine Service pager [phone]

## 2024-10-20 NOTE — Consult Note (Addendum)
 CARDIOLOGY CONSULT NOTE    Patient ID: Bobby Horn; [id number]; October 30, 1966   Admit date: 10/20/2024 Date of Consult: 10/20/2024  Primary Care Provider: Pcp, No Primary Cardiologist:  Primary Electrophysiologist:     History of Present Illness:   [name] is a 58 year old M known to have paroxysmal A-fib, nicotine abuse, history of self-reported MI and PCI presented to the ER with SOB.  Patient presented with acute onset of SOB for the last 3 days.  Orthopnea and PND present.  No legs swelling noted.  He also was having active chest pain for the last 3 days.  Substernal in location.  He reported that Dr. Victory Horn performed cath and stents but am not able to find the report.  He does not smoke cigarettes but vapes, nicotine.  He does not have diabetes but thinks he has diabetes because of the way he eats.  Does not have other symptoms or syncope, leg swelling.  EKG showed NSR, ST depressions in the anterolateral leads with T wave inversions which are new compared to prior EKG.  He also has ST depressions in the inferior leads. Hs troponin significant elevated, 1.6k.  BNP pending.  Chest x-ray showed pulmonary vascular congestion.  On 5L O2, not on home O2.  Past Medical History:  Diagnosis Date   Alcohol abuse    Angina    Anxiety    Arthritis    Atrial fibrillation (HCC)    Chronic health problem 10/20/2024   Chronic pain syndrome    Narcotic seeking behavior with numerous requests for pain medications, IV dilaudid    Coronary artery disease    GERD (gastroesophageal reflux disease)    Gout    from alcohol   Gout    Headache(784.0)    Hypertension    Hypertension    Nephrolithiasis 04/2011   Left ureteral stone   NSTEMI (non-ST elevated myocardial infarction) (HCC) 10/20/2024   NSTEMI (non-ST elevated myocardial infarction) (HCC) 11/04/2011   IMO SNOMED Dx Update Oct 2024    Shortness of breath    Supraventricular tachycardia    Longstanding,  s/p ablation 05/2009  with LV bypass tract, then with 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) requiring emergent cardioversion and s/p accessory pathway ablation 06/2010   Tobacco abuse     Past Surgical History:  Procedure Laterality Date   CARDIAC CATHETERIZATION  07/01/2010   KNEE ARTHROPLASTY     left   TOOTH EXTRACTION N/A 12/24/2021   Procedure: DENTAL RESTORATION/EXTRACTIONS;  Surgeon: Sheryle Hamilton, DMD;  Location: [facility] OR;  Service: Oral Surgery;  Laterality: N/A;       Inpatient Medications: Scheduled Meds:  Continuous Infusions:  heparin  1,300 Units/hr (10/20/24 1408)   nitroGLYCERIN  20 mcg/min (10/20/24 1443)   PRN Meds:   Allergies:   No Known Allergies  Social History:   Social History   Socioeconomic History   Marital status: Widowed    Spouse name: Not on file   Number of children: Not on file   Years of education: Not on file   Highest education level: Not on file  Occupational History   Not on file  Tobacco Use   Smoking status: Every Day    Current packs/day: 0.25    Average packs/day: 0.3 packs/day for 20.0 years (5.0 ttl pk-yrs)    Types: Cigarettes   Smokeless tobacco: Never   Tobacco comments:    Previously 2 ppd, now 0.25 ppd  Vaping Use   Vaping status: Never Used  Substance  and Sexual Activity   Alcohol use: Yes    Comment: States he has not drank since the ablation in 2011   Drug use: No   Sexual activity: Not on file  Other Topics Concern   Not on file  Social History Narrative   ** Merged History Encounter **       Lives in [place] with his wife. 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 Having problems with insurance, medical access   Social Drivers of Health   Financial Resource Strain: Not on file  Food Insecurity: Not on file  Transportation Needs: Not on file  Physical Activity: Not on file  Stress: Not on file  Social Connections: Not on file  Intimate Partner Violence: Not on file    Family History:    Family  History  Problem Relation Age of Onset   Diabetes Mother        No known heart disease, didn't know his father   Coronary artery disease Mother    Other Other        grandmother-hx of palpitations     ROS:  Please see the history of present illness.  ROS  All other ROS reviewed and negative.     Physical Exam/Data:   Vitals:   10/20/24 1430 10/20/24 1435 10/20/24 1440 10/20/24 1445  BP: (!) 124/97 (!) 129/95 (!) 136/99 (!) 141/103  Pulse: (!) 104 (!) 102 (!) 104 (!) 110  Resp: (!) 24 (!) 26 (!) 25 (!) 27  Temp:      TempSrc:      SpO2: 95% 97% 99% 99%  Weight:      Height:        Intake/Output Summary (Last 24 hours) at 10/20/2024 1453 Last data filed at 10/20/2024 1432 Gross per 24 hour  Intake 200 ml  Output --  Net 200 ml   Filed Weights   10/20/24 1214  Weight: 100.2 kg   Body mass index is 27.62 kg/m.  General:  Well nourished, well developed, in acute distress HEENT: normal Lymph: no adenopathy Neck: JVD elevated, sitting up in the bed in acute respiratory distress Endocrine:  No thryomegaly Vascular: No carotid bruits; FA pulses 2+ bilaterally without bruits  Cardiac:  normal S1, S2; RRR; no murmur  Lungs: Bibasilar rales present Abd: soft, nontender, no hepatomegaly  Ext: no edema Musculoskeletal:  No deformities, BUE and BLE strength normal and equal Skin: warm and dry  Neuro:  CNs 2-12 intact, no focal abnormalities noted Psych:  Normal affect    Laboratory Data:  Chemistry Recent Labs  Lab 10/20/24 1217 10/20/24 1314  NA 137 136  K 4.6 4.5  CL 103 103  CO2 20*  --   GLUCOSE 120* 120*  BUN 20 25*  CREATININE 1.50* 1.60*  CALCIUM 8.6*  --   GFRNONAA 54*  --   ANIONGAP 14  --     No results for input(s): PROT, ALBUMIN, AST, ALT, ALKPHOS, BILITOT in the last 168 hours. Hematology Recent Labs  Lab 10/20/24 1217 10/20/24 1314  WBC 13.3*  --   RBC 6.23*  --   HGB 21.6* 22.1*  HCT 62.9* 65.0*  MCV 101.0*  --   MCH  34.7*  --   MCHC 34.3  --   RDW 12.7  --   PLT 205  --    Cardiac EnzymesNo results for input(s): TROPONINI in the last 168 hours. No results for input(s): TROPIPOC in the last 168 hours.  BNPNo results for  input(s): BNP, PROBNP in the last 168 hours.  DDimer No results for input(s): DDIMER in the last 168 hours.  Radiology/Studies:  DG Chest Portable 1 View Result Date: 10/20/2024 CLINICAL DATA:  Chest pain and shortness of breath. EXAM: PORTABLE CHEST 1 VIEW COMPARISON:  07/01/2021 FINDINGS: Lungs are adequately inflated and demonstrate moderate hazy perihilar and bibasilar opacification likely interstitial edema. Infection in the lung bases is possible. No definite effusion. Borderline cardiomegaly. Remainder of the exam is unchanged. IMPRESSION: Moderate hazy perihilar and bibasilar opacification likely interstitial edema. Infection in the lung bases is possible. Electronically Signed   By: Toribio Agreste M.D.   On: 10/20/2024 13:19    Assessment and Plan:   Acute hypoxic respiratory failure Acute congestive heart failure NSTEMI - Presented with 3-day onset of SOB and chest pain.  BNP pending.  Chest x-ray showed pulmonary vascular congestion.  - Patient self reported that he underwent cath and PCI in the past.  No report on file. - EKG showed SR, ST depressions in the inferior leads, anterolateral leads with T wave inversions.  These are new compared to prior EKG. - Hs troponins elevated, 1600.  Trend troponins. - Stop IV fluids. IV Lasix 80 mg 1 dose given now.  Start Lasix drip at 10 mg/h. - 7/10 chest pain during my interview.  On NTG drip.  Titrate up to 50 mcg.  If he needs more than 50 mcg, cardiology on-call fellow/staff will need to be notified. - Start ACS protocol, aspirin  load followed by aspirin  81 mg once daily, high intensity statin and heparin  drip. - On 5L nasal cannula oxygen which is new.  Not on home oxygen. - Obtain echocardiogram, lipid panel, HbA1c. -  Keep n.p.o. after midnight for LHC and RHC tomorrow AM, pending reassessment of his clinical status.  Nicotine abuse - Does not smoke cigarettes.  Vapes, with nicotine.  Counseling provided.  Polycythemia - Hemoglobin 21, 22 on repeat labs.  Per primary team.   60-minute spent in reviewing prior medical records,  discussion and documentation.   For questions or updates, please contact [facility] Please consult [email or web address] for contact info under Cardiology/STEMI.   Signed, Cella Cappello Priya Woodie Trusty, MD 10/20/2024 2:53 PM

## 2024-10-20 NOTE — ED Notes (Signed)
Admitting provider at BS

## 2024-10-20 NOTE — ED Notes (Signed)
 Stop NS bolus per cardiologist

## 2024-10-20 NOTE — ED Notes (Signed)
 Family leaving BS. Belongings bagged and labeled (phone and upper dentures).

## 2024-10-20 NOTE — ED Notes (Signed)
 Per cardiologist pt can have oral fluids.

## 2024-10-20 NOTE — ED Notes (Signed)
 C/o CP worsening, HA worsening.

## 2024-10-20 NOTE — Assessment & Plan Note (Addendum)
*  will order home meds once med rec complete   HTN : Home amLODipine 10 mg daily  Atrial fibrillation : currently not in A-fib : s/p ablation GERD : Protonix  Daily  Gout : Home ibuprofen  (ADVIL ) 800 MG tablet TID on hold Alcohol abuse : Continue CIWA protocol Chronic pain syndrome : SUBOXONE 8-2 MG FILM Q8Hs

## 2024-10-20 NOTE — Hospital Course (Signed)
 Bobby Horn is a 58 y.o.male with a history of  substance abuse, alcohol abuse, HTN, A-fib s/p ablation who was admitted to the [facility] Teaching Service at [facility] for chest pain and SOB. His hospital course is detailed below:  NSTEMI (non-ST elevated myocardial infarction) Mr. Paulino presented at the ED c/o worsening chest pain. In the ED, patient hemodynamically stable; troponin 1,655. EKG w/ ST depression. Cardiology was consulted and recommended IV Lasix 80 mg once then Lasix gtt 10 mg/hr; NTG drip and started ACS protocol with aspirin  load followed by aspirin  81 mg once daily, high intensity statin and heparin  drip. TTE obtained showing EF 20-25%. LHC and RHC was completed which showed LAD, 2nd Septal branch(99%) , Left Circumflex (10%), and RCA. No intervention required. Coreg 3.125 mg BID and Losartan 12.5 mg Daily, Plavix 75 mg and ASA daily, and Lipitor 80 mg daily were started by cardiology prior to discharge. Patient diuresed 3.5 L this admission with discharge weight of 210 lbs.   Polycythemia:  Hgb on admission 22.2 with hematocrit of 65. Patient reported taking testosterone  outpatient. Hgb at discharge 17.2 with HCT of 50.2. Recommend discontinuing testosterone  outpatient.   Alcohol Use Last drink 11/2. Patient started on Librium taper. CIWAs monitored with Ativan  prn. Supplemented with folic acid, Vitamin B1, multivitamin.  AKI Elevated BUN and sCr during this admission. Still make urine w/o difficulty. Suspect underlying kidney disease at baseline with initial creatinine at 1.3.   Other chronic conditions were medically managed with home medications and formulary alternatives as necessary (HTN, GERD, gout, chronic pain)  PCP Follow-up Recommendations: Discontinue testosterone  outpatient F/u w/ smoking cessation  Ensure follow-up with cardiology Repeat BMP w/ GFR for kidney function d/t AKI

## 2024-10-20 NOTE — Plan of Care (Signed)
 FMTS Brief Progress Note  S: Went to evaluate patient at bedside with Dr. Lupie.  Patient reports ongoing chest pain.  Nitro drip currently running at 13.5, spoke with nursing who said it was held due to systolic blood pressures.  Reviewed repeat EKG which showed biphasic T waves in V1/V2 paged cardiology fellow-who returned call see separate note.  O: BP 101/61   Pulse (!) 124   Temp 99.1 F (37.3 C) (Oral)   Resp (!) 32   Ht 6' 3 (1.905 m)   Wt 100.2 kg   SpO2 94%   BMI 27.62 kg/m    General: NAD, uncomfortable appearing.  Awake and oriented Cardio: RRR, no MRG. Respiratory: CTAB, normal wob on RA Skin: Warm and dry  A/P: - Recheck blood pressure, 50 mcg fentanyl  for discomfort - Cardiology fellow will see again tonight - Continue plan as outlined by H&P from day team - Orders reviewed. Labs for AM ordered, which was adjusted as needed.  - If condition changes, plan includes page family medicine teaching service.   Howell Lunger, DO 10/20/2024, 8:43 PM PGY-3, [facility] Night Resident  Please page [phone] with questions.

## 2024-10-20 NOTE — ED Provider Notes (Signed)
 [name] Provider Note   CSN: [id number] Arrival date & time: 10/20/24  1209     Patient presents with: Chest Pain and Shortness of Breath   Bobby Horn is a 58 y.o. male.  {Add pertinent medical, surgical, social history, OB history to HPI:32947}  Chest Pain Associated symptoms: shortness of breath   Shortness of Breath Associated symptoms: chest pain   Patient with chest pain.  Anterior chest.  Does go to the back.  Has had since yesterday.  Some nausea.  Feeling bad.  No fevers.  No cough.  Worse with certain movements.  Has been constant since yesterday.    Past Medical History:  Diagnosis Date   Alcohol abuse    Angina    Anxiety    Arthritis    Atrial fibrillation (HCC)    Chronic pain syndrome    Narcotic seeking behavior with numerous requests for pain medications, IV dilaudid    Coronary artery disease    GERD (gastroesophageal reflux disease)    Gout    from alcohol   Gout    Headache(784.0)    Hypertension    Hypertension    Nephrolithiasis 04/2011   Left ureteral stone   Shortness of breath    Supraventricular tachycardia    Longstanding,  s/p ablation 05/2009 with LV bypass tract, then with 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) requiring emergent cardioversion and s/p accessory pathway ablation 06/2010   Tobacco abuse     Prior to Admission medications   Medication Sig Start Date End Date Taking? Authorizing Provider  amLODipine (NORVASC) 10 MG tablet Take 10 mg by mouth daily.    [provider]  amoxicillin  (AMOXIL ) 500 MG capsule Take 1 capsule (500 mg total) by mouth 3 (three) times daily. 12/24/21   Sheryle Hamilton, DMD  dicyclomine  (BENTYL ) 20 MG tablet Take 1 tablet (20 mg total) by mouth 2 (two) times daily. 04/08/24 05/08/24  Waddell Sluder, PA-C  ibuprofen  (ADVIL ) 800 MG tablet Take 1 tablet (800 mg total) by mouth 3 (three) times daily. 08/09/24   Vicky Charleston,  PA-C  pantoprazole  (PROTONIX ) 20 MG tablet Take 1 tablet (20 mg total) by mouth daily for 14 days. 04/08/24 04/22/24  Waddell Sluder, PA-C  SUBOXONE 8-2 MG FILM Take 1 Film by mouth in the morning, at noon, and at bedtime. 06/25/21   [provider]  metoprolol  (LOPRESSOR ) 50 MG tablet Take 50 mg by mouth 2 (two) times daily. 11/06/11 03/13/12  Gabriel Noa, MD    Allergies: Patient has no known allergies.    Review of Systems  Respiratory:  Positive for shortness of breath.   Cardiovascular:  Positive for chest pain.    Updated Vital Signs BP (!) 113/102   Pulse (!) 108   Resp (!) 26   Ht 6' 3 (1.905 m)   Wt 100.2 kg   SpO2 96%   BMI 27.62 kg/m   Physical Exam Vitals and nursing note reviewed.  Constitutional:      Appearance: He is well-developed.     Comments:   Patient appears uncomfortable.  Cardiovascular:     Rate and Rhythm: Regular rhythm.  Pulmonary:     Breath sounds: No wheezing.  Chest:     Chest wall: Tenderness present.     Comments: Tenderness anterior chest wall. Abdominal:     Tenderness: There is no abdominal tenderness.  Musculoskeletal:     Right lower leg: No edema.  Left lower leg: No edema.  Skin:    Comments: Face is somewhat flushed.  Neurological:     Mental Status: He is alert.     (all labs ordered are listed, but only abnormal results are displayed) Labs Reviewed  BASIC METABOLIC PANEL WITH GFR  CBC  I-STAT CHEM 8, ED  TROPONIN I (HIGH SENSITIVITY)    EKG: EKG Interpretation Date/Time:  Sunday October 20 2024 12:14:08 EST Ventricular Rate:  104 PR Interval:  124 QRS Duration:  106 QT Interval:  334 QTC Calculation: 439 R Axis:   91  Text Interpretation: Sinus tachycardia Biatrial enlargement Rightward axis Left ventricular hypertrophy with repolarization abnormality ( Cornell product ) Septal infarct , age undetermined Abnormal ECG When compared with ECG of 24-Dec-2021 07:15, T waves down in  V4 through V5.  Also  inferior  ST and T wave changes.  Potential  ST elevation Confirmed by Patsey Lot [phone]) on 10/20/2024 12:18:26 PM  Radiology: No results found.  {Document cardiac monitor, telemetry assessment procedure when appropriate:32947} Procedures   Medications Ordered in the ED - No data to display    {Click here for ABCD2, HEART and other calculators REFRESH Note before signing:1}                              Medical Decision Making Amount and/or Complexity of Data Reviewed Labs: ordered. Radiology: ordered.   Patient with anterior chest pain.  Also close to the back.  Has EKG changes without specific ST elevation.  Does have diffuse T wave inversion.  Discussed with Dr. Wendel.  Not a Cath Lab candidate at this time since no ST elevation.  Will get x-ray and basic blood work.  Reviewed previous stress test result although it was around 14 years ago.  {Document critical care time when appropriate  Document review of labs and clinical decision tools ie CHADS2VASC2, etc  Document your independent review of radiology images and any outside records  Document your discussion with family members, caretakers and with consultants  Document social determinants of health affecting pt's care  Document your decision making why or why not admission, treatments were needed:32947:::1}   Final diagnoses:  None    ED Discharge Orders     None

## 2024-10-20 NOTE — ED Triage Notes (Signed)
 Patient is having left sided chest pain, left arm pain, back pain. Patient started having the pain last night. He is having shortness of breath. Also stating he has had vomiting, nausea, dizziness, and has been diaphoretic.

## 2024-10-20 NOTE — H&P (Cosign Needed Addendum)
 Hospital Admission History and Physical Service Pager: [phone]  Patient name: Bobby Horn Medical record number: [id number] Date of Birth: 1966/02/04 Age: 58 y.o. Gender: male  Primary Care Provider: Pcp, No Consultants: Cardiology Code Status: Full code which was confirmed with family if patient unable to confirm  Preferred Emergency Contact:  Bobby Horn,Bobby Horn (Friend) [phone] (Mobile)   Chief Complaint: Chest pain and SOB  Assessment and Plan: Bobby Horn is a 57 y.o. male presenting with chest pain and SOB  Differential for presentation of this includes .  NSTEMI : Most likely: patient present w/ worsening chest pain and SOB . Elevated Trop and EKG w/ ST depression. Now on Heparin  and nitroglycerin  gtt w/ cardiology consult w/ heart cath tmrw  CHF could contributing to his picture. Patient CXR show moderate hazy perihilar and bibasilar opacification likely interstitial edema. He required 5L [place] to maintain his oxygen saturation above 88%. We will adding BNP and possible Echocardiogram to further investigate his CHF  COPD less likely patient with history of vapes w/ Nicotine, denies home oxygen   Pneumonia : less likely as patient denies fever, shivering, or any s/s of infection however his WBC is 13.3 w/ CXR shows hazy perihilar and bibasilar opacification Infection in the lung bases could be possible.    Assessment & Plan NSTEMI (non-ST elevated myocardial infarction) (HCC) - Admit to FMTS, attending Dr. Donzetta - Vital signs per floor - Regular diet  - Cardiology consult , appreciate recommendations - Given Lasix 80 mg IV once then Starting Lasix gtt 10 mg/hr - On NTG drip.  Titrate up to 50 mcg.  If he needs more than 50 mcg, cardiology on-call fellow/staff will need to be notified.  - Start ACS protocol, aspirin  load followed by aspirin  81 mg once daily, high intensity statin and heparin  drip.  - NPO after midnight for LHC and RHC tomorrow AM pending  reassessment of his clinical status.  - PT/OT to treat - VTE prophylaxis : Heparin  gtt - Daily AM CBC/BMP  - Pending BNP - Trend Trop  - Fall precautions - Delirium precautions  Polycythemia Elevated H/H : 22.1/65 on admission w/ Hx of non-prescription testosterone  use  - Daily CBC - Counsel patient on discontinuing at discharge  Chronic health problem *will order home meds once med rec complete   HTN : Home amLODipine 10 mg daily  Atrial fibrillation : currently not in A-fib : s/p ablation GERD : Protonix  Daily  Gout : Home ibuprofen  (ADVIL ) 800 MG tablet TID on hold Alcohol abuse : Continue CIWA protocol Chronic pain syndrome : SUBOXONE 8-2 MG FILM Q8Hs    FEN/GI: NPO VTE Prophylaxis: Heparin  gtt   Disposition: Progressive   History of Present Illness:  Bobby Horn is a 58 y.o. male w/ hx of substance abuse, alcohol abuse, HTN, A-fib s/p ablation presenting with worsening chest pain and SOB. Patient states his chest pain was started about 2 months ago. However his pain become worse in the last 2-3 day. Per patient he has been using non-medical prescribed Testosterone  w/ unknown dosage and uses cocaine 2 days ago. Patient also c/o fever at home, feeling shivering, and low energy. He denied palpitation, N/V/D  In the ED, patient hemodynamically stable his pertinent labs include BUN 25, sCr 1.6, GFR 54, Trp 1,655, H/H 22.1/65   Pertinent Past Medical History: Tobacco abuse Substance use  HTN Arthritis  Atrial fibrillation s/p cardio ablation  GERD Remainder reviewed in history tab.   Pertinent  Past Surgical History: Left knee replacement  Remainder reviewed in history tab.   Pertinent Social History: Tobacco use: Yes, vape pen  Alcohol use: daily use, hard liquor last shot this morning  Other Substance use: cocaine, THC Lives by himself   Pertinent Family History: None contributing Remainder reviewed in history tab.   Important Outpatient  Medications: amLODipine (NORVASC) 10 MG tablet Daily  ibuprofen  (ADVIL ) 800 MG tablet TID pantoprazole  (PROTONIX ) 20 MG tablet daily SUBOXONE 8-2 MG FILM Q8Hs Lisinopril       Remainder reviewed in medication history.   Objective: BP (!) 129/100   Pulse (!) 110   Temp 97.7 F (36.5 C) (Oral)   Resp (!) 30   Ht 6' 3 (1.905 m)   Wt 100.2 kg   SpO2 96%   BMI 27.62 kg/m  Exam: Physical Exam Constitutional:      Appearance: He is well-developed.  Cardiovascular:     Rate and Rhythm: Normal rate.  Pulmonary:     Effort: Pulmonary effort is normal.     Breath sounds: Examination of the right-lower field reveals rales. Examination of the left-lower field reveals rales. Rales present.     Comments: On 3L [place] Abdominal:     Palpations: Abdomen is soft.     Comments: Last BM 10/20/24  Skin:    General: Skin is warm and dry.  Neurological:     Mental Status: He is alert and oriented to person, place, and time.  Psychiatric:        Mood and Affect: Mood normal.        Behavior: Behavior normal.      Labs:  CBC BMET  Recent Labs  Lab 10/20/24 1217 10/20/24 1314  WBC 13.3*  --   HGB 21.6* 22.1*  HCT 62.9* 65.0*  PLT 205  --    Recent Labs  Lab 10/20/24 1217 10/20/24 1314  NA 137 136  K 4.6 4.5  CL 103 103  CO2 20*  --   BUN 20 25*  CREATININE 1.50* 1.60*  GLUCOSE 120* 120*  CALCIUM 8.6*  --      Imaging Studies Performed: EXAM: PORTABLE CHEST 1 VIEW IMPRESSION: Moderate hazy perihilar and bibasilar opacification likely interstitial edema. Infection in the lung bases is possible.   Suzen Houston NOVAK, DO 10/20/2024, 2:13 PM PGY-1, [facility]  FPTS Intern pager: [phone], text pages welcome Secure chat group [facility] Teaching Service   I have reviewed the above note, agree with its content, and have made the appropriate changes.   Damien Pinal, DO [facility], PGY-3

## 2024-10-20 NOTE — Plan of Care (Signed)
 Curbside to hematology on call to discuss elevated HCT and Hgb. Per Dr. Cloretta most likely elevated in setting of testosterone  use. No indication for therapeutic phlebotomy at this time. Recommend discontinuing testosterone  outpatient.   Damien Pinal, DO [facility], PGY-3 10/20/24 6:27 PM

## 2024-10-20 NOTE — Plan of Care (Signed)
 Spoke with cardiology fellow, concern for continued pain despite nitro drip (holding titration 2/2 low systolic blood pressures), and to review EKG with biphasic T waves in V1/V2. Cardiology recommends repeating blood pressure, titrating nitro to pain if blood pressure will tolerate.  Additionally okay to give additional fentanyl .  They will see patient while they are in the ED. We will message nursing to cycle blood pressure.  We will give one-time fentanyl  dose to assist in pain. We appreciate cardiology's assistance with this patient's care.

## 2024-10-20 NOTE — ED Notes (Signed)
 Pt desat to low 80%s with tachypnea and labored breathing. Pt placed on 5LNC satting in low to mid 90%s. EDP made aware.

## 2024-10-20 NOTE — ED Notes (Signed)
 Echo at bedside

## 2024-10-20 NOTE — Progress Notes (Signed)
 PHARMACY - ANTICOAGULATION CONSULT NOTE  Pharmacy Consult for heparin  Indication: chest pain/ACS  No Known Allergies  Patient Measurements: Height: 6' 3 (190.5 cm) Weight: 100.2 kg (221 lb) IBW/kg (Calculated) : 84.5 HEPARIN  DW (KG): 100.2  Vital Signs: Temp: 97.7 F (36.5 C) (11/02 1216) Temp Source: Oral (11/02 1216) BP: 129/100 (11/02 1330) Pulse Rate: 110 (11/02 1330)  Labs: Recent Labs    10/20/24 1217 10/20/24 1314  HGB 21.6* 22.1*  HCT 62.9* 65.0*  PLT 205  --   CREATININE 1.50* 1.60*  TROPONINIHS 1,655*  --     Estimated Creatinine Clearance: 60.1 mL/min (A) (by C-G formula based on SCr of 1.6 mg/dL (H)).   Medical History: Past Medical History:  Diagnosis Date   Alcohol abuse    Angina    Anxiety    Arthritis    Atrial fibrillation (HCC)    Chronic pain syndrome    Narcotic seeking behavior with numerous requests for pain medications, IV dilaudid    Coronary artery disease    GERD (gastroesophageal reflux disease)    Gout    from alcohol   Gout    Headache(784.0)    Hypertension    Hypertension    Nephrolithiasis 04/2011   Left ureteral stone   Shortness of breath    Supraventricular tachycardia    Longstanding,  s/p ablation 05/2009 with LV bypass tract, then with 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) requiring emergent cardioversion and s/p accessory pathway ablation 06/2010   Tobacco abuse     Assessment: 58 YOM presenting with CP and SOB, elevated troponin, he is not on anticoagulation PTA,   Goal of Therapy:  Heparin  level 0.3-0.7 units/ml Monitor platelets by anticoagulation protocol: Yes   Plan:  Heparin  4000 units IV x 1, and gtt at 1300 units/hr F/u 6 hour heparin  level F/u cards eval and recs  Dorn Poot, PharmD, [facility] Clinical Pharmacist ED Pharmacist Phone # [phone] 10/20/2024 1:57 PM

## 2024-10-20 NOTE — Assessment & Plan Note (Addendum)
 Elevated H/H : 22.1/65 on admission w/ Hx of non-prescription testosterone  use  - Daily CBC - Counsel patient on discontinuing at discharge

## 2024-10-20 NOTE — Assessment & Plan Note (Addendum)
-   Admit to FMTS, attending Dr. Donzetta - Vital signs per floor - Regular diet  - Cardiology consult , appreciate recommendations - Given Lasix 80 mg IV once then Starting Lasix gtt 10 mg/hr - On NTG drip.  Titrate up to 50 mcg.  If he needs more than 50 mcg, cardiology on-call fellow/staff will need to be notified.  - Start ACS protocol, aspirin  load followed by aspirin  81 mg once daily, high intensity statin and heparin  drip.  - NPO after midnight for LHC and RHC tomorrow AM pending reassessment of his clinical status.  - PT/OT to treat - VTE prophylaxis : Heparin  gtt - Daily AM CBC/BMP  - Pending BNP - Trend Trop  - Fall precautions - Delirium precautions

## 2024-10-20 NOTE — ED Notes (Signed)
 Admitting  at bedside.

## 2024-10-20 NOTE — Plan of Care (Signed)
 Noticed significant elevation in troponins from 1500-4000, paged cardiology fellow.  Cardiology fellow returned message through secure chat, that they will be taking patient to Cath Lab tonight.  We greatly appreciate cardiology's assistance in this patient's care.

## 2024-10-21 ENCOUNTER — Other Ambulatory Visit (HOSPITAL_COMMUNITY): Payer: Self-pay

## 2024-10-21 ENCOUNTER — Encounter (HOSPITAL_COMMUNITY): Payer: Self-pay | Admitting: Internal Medicine

## 2024-10-21 ENCOUNTER — Telehealth (HOSPITAL_COMMUNITY): Payer: Self-pay

## 2024-10-21 DIAGNOSIS — I214 Non-ST elevation (NSTEMI) myocardial infarction: Secondary | ICD-10-CM | POA: Diagnosis not present

## 2024-10-21 DIAGNOSIS — I5021 Acute systolic (congestive) heart failure: Secondary | ICD-10-CM | POA: Insufficient documentation

## 2024-10-21 DIAGNOSIS — I509 Heart failure, unspecified: Secondary | ICD-10-CM

## 2024-10-21 HISTORY — DX: Heart failure, unspecified: I50.9

## 2024-10-21 LAB — COMPREHENSIVE METABOLIC PANEL WITH GFR
ALT: 17 U/L (ref 0–44)
AST: 38 U/L (ref 15–41)
Albumin: 2.7 g/dL — ABNORMAL LOW (ref 3.5–5.0)
Alkaline Phosphatase: 56 U/L (ref 38–126)
Anion gap: 11 (ref 5–15)
BUN: 21 mg/dL — ABNORMAL HIGH (ref 6–20)
CO2: 25 mmol/L (ref 22–32)
Calcium: 7.9 mg/dL — ABNORMAL LOW (ref 8.9–10.3)
Chloride: 96 mmol/L — ABNORMAL LOW (ref 98–111)
Creatinine, Ser: 1.49 mg/dL — ABNORMAL HIGH (ref 0.61–1.24)
GFR, Estimated: 54 mL/min — ABNORMAL LOW (ref >60)
Glucose, Bld: 113 mg/dL — ABNORMAL HIGH (ref 70–99)
Potassium: 3.5 mmol/L (ref 3.5–5.1)
Sodium: 132 mmol/L — ABNORMAL LOW (ref 135–145)
Total Bilirubin: 1.7 mg/dL — ABNORMAL HIGH (ref 0.0–1.2)
Total Protein: 5.8 g/dL — ABNORMAL LOW (ref 6.5–8.1)

## 2024-10-21 LAB — CBC
HCT: 48.5 % (ref 39.0–52.0)
Hemoglobin: 17.1 g/dL — ABNORMAL HIGH (ref 13.0–17.0)
MCH: 34.5 pg — ABNORMAL HIGH (ref 26.0–34.0)
MCHC: 35.3 g/dL (ref 30.0–36.0)
MCV: 97.8 fL (ref 80.0–100.0)
Platelets: 199 K/uL (ref 150–400)
RBC: 4.96 MIL/uL (ref 4.22–5.81)
RDW: 12.6 % (ref 11.5–15.5)
WBC: 10.6 K/uL — ABNORMAL HIGH (ref 4.0–10.5)
nRBC: 0 % (ref 0.0–0.2)

## 2024-10-21 LAB — MAGNESIUM: Magnesium: 1.6 mg/dL — ABNORMAL LOW (ref 1.7–2.4)

## 2024-10-21 LAB — MRSA NEXT GEN BY PCR, NASAL: MRSA by PCR Next Gen: NOT DETECTED

## 2024-10-21 LAB — LACTIC ACID, PLASMA: Lactic Acid, Venous: 1.4 mmol/L (ref 0.5–1.9)

## 2024-10-21 MED ORDER — LOSARTAN POTASSIUM 25 MG PO TABS
12.5000 mg | ORAL_TABLET | Freq: Every day | ORAL | Status: DC
  Administered 2024-10-21 – 2024-10-22 (×2): 12.5 mg via ORAL
  Filled 2024-10-21 (×2): qty 1

## 2024-10-21 MED ORDER — MAGNESIUM SULFATE 4 GM/100ML IV SOLN
4.0000 g | Freq: Once | INTRAVENOUS | Status: AC
  Administered 2024-10-21: 4 g via INTRAVENOUS
  Filled 2024-10-21: qty 100

## 2024-10-21 MED ORDER — ENOXAPARIN SODIUM 40 MG/0.4ML IJ SOSY
40.0000 mg | PREFILLED_SYRINGE | Freq: Every day | INTRAMUSCULAR | Status: DC
  Administered 2024-10-21: 40 mg via SUBCUTANEOUS
  Filled 2024-10-21 (×2): qty 0.4

## 2024-10-21 MED ORDER — POTASSIUM CHLORIDE CRYS ER 20 MEQ PO TBCR
40.0000 meq | EXTENDED_RELEASE_TABLET | Freq: Two times a day (BID) | ORAL | Status: AC
  Administered 2024-10-21 (×2): 40 meq via ORAL
  Filled 2024-10-21 (×2): qty 2

## 2024-10-21 MED ORDER — FUROSEMIDE 20 MG PO TABS
20.0000 mg | ORAL_TABLET | Freq: Every day | ORAL | Status: DC
  Administered 2024-10-21 – 2024-10-22 (×2): 20 mg via ORAL
  Filled 2024-10-21 (×2): qty 1

## 2024-10-21 MED ORDER — CARVEDILOL 3.125 MG PO TABS
3.1250 mg | ORAL_TABLET | Freq: Two times a day (BID) | ORAL | Status: DC
  Administered 2024-10-21 – 2024-10-22 (×3): 3.125 mg via ORAL
  Filled 2024-10-21 (×3): qty 1

## 2024-10-21 MED ORDER — LACTATED RINGERS IV BOLUS
1000.0000 mL | Freq: Once | INTRAVENOUS | Status: AC
  Administered 2024-10-21: 1000 mL via INTRAVENOUS

## 2024-10-21 NOTE — Progress Notes (Signed)
 Chaplain followed up on Code [facility]. No family bedside. Pt Bobby Horn politely declined chaplain services at this time.  Chaplains remain available as needs arise.

## 2024-10-21 NOTE — Assessment & Plan Note (Addendum)
 HTN : Home amLODipine 10 mg daily (discontinue) D/t cardiac status : starting patient on  Atrial fibrillation : currently not in A-fib : s/p ablation GERD : Protonix  Daily  Gout : Home ibuprofen  (ADVIL ) 800 MG tablet TID on hold Chronic pain syndrome : Continue SUBOXONE 8-2 MG FILM Q8Hs

## 2024-10-21 NOTE — Assessment & Plan Note (Signed)
 Elevated H/H : 22.1/65 on admission w/ Hx of non-prescription testosterone  use  Curbside Heme : no indication beside stop testosterone  at this time - Daily CBC

## 2024-10-21 NOTE — Progress Notes (Signed)
   Heart Failure Stewardship Pharmacist Progress Note   PCP: Pcp, No PCP-Cardiologist: None    HPI:  58 yo M with PMH of afib, tobacco use, and self-reported MI and PCI.   Presented to the ED on 11/2 with chest pain, L arm pain, and back pain. Associated with shortness of breath, orthopnea, PND, diaphoresis, N/V, and dizziness. EKG with ST depression. Troponin and BNP elevated. Admitted for NSTEMI and CHF. CXR with interstitial edema. UDS positive for cocaine. ECHO 11/2 with LVEF 20-25%, no RWMA, RV moderately reduced, mild MR. Taken to the cath lab with uncontrolled chest pain. Found to have mild nonobstructive disease, no evidence of Takotsubo cardiomyopathy. LVEDP 17. Recommended medical management.   Denies shortness of breath or chest pain. No LE edema on exam. Began HF education but patient was falling asleep. Will need continued education later today vs tomorrow.  Current HF Medications: Diuretic: furosemide 20 mg PO daily ACE/ARB/ARNI: losartan 12.5 mg daily  Prior to admission HF Medications: None  Pertinent Lab Values: Serum creatinine 1.49, BUN 21, Potassium 3.5, Sodium 132, BNP 921.4, Magnesium  1.6  Vital Signs: Weight: 206 lbs (admission weight: estimated 221 lbs) Blood pressure: 90-110/70s  Heart rate: 90s  I/O: net -2.3L yesterday; net -2.4L since admission  Medication Assistance / Insurance Benefits Check: Does the patient have prescription insurance?  Yes Type of insurance plan: [place] Medicaid  Outpatient Pharmacy:  Prior to admission outpatient pharmacy: [facility] Is the patient willing to use [facility] TOC pharmacy at discharge? Yes Is the patient willing to transition their outpatient pharmacy to utilize a [facility] outpatient pharmacy?   Pending    Assessment: 1. Acute systolic CHF (LVEF 20-25%), due to NICM. NYHA class II symptoms. - Agree with switching to furosemide 20 mg PO daily. Strict I/Os and daily weights. Keep K>4 and Mg>2. Magnesium  4g IV x 1 and KCl  40 mEq x2 ordered for replacement. - Recommend transitioning labetalol  to carvedilol 3.125 mg BID with HFrEF and cocaine use.  - Agree with adding low dose losartan 12.5 mg daily - Consider MRA pending BP response - Consider adding SGLT2i prior to discharge   Plan: 1) Medication changes recommended at this time: - Stop labetalol  - Start carvedilol 3.125 mg BID  2) Patient assistance: [employer] copay $4 - Farxiga/Jardiance require prior authorization  3)  Education  - Began HF education but patient falling asleep during discussion  - Will need continued education before discharge   Bobby Horn, PharmD, BCPS Heart Failure Engineer, Building Services Phone [phone]

## 2024-10-21 NOTE — Assessment & Plan Note (Addendum)
-   Vital signs per floor - Heart Healthy diet  - Cardiology consult , appreciate recommendations. - Continue ACS protocol : Aspirin  81 mg daily, Atorvastatin 80 mg Daily  - Continue Plavix 75 mg daily w/ breakfast  - Continue Lasix 20 mg daily  - Continue Labetalol  100 mg BID in the setting of cocaine use based on nonselectivity.  - Strict I&O - Pain : Tylenol  650 mg Q6H PRN, Morphine  IV 1 mg Q4H PRN - Continue bowel regimen : Senna 8.6 mg BID - PT/OT continue follow - Daily AM CBC/BMP  - Fall precautions - Delirium precautions

## 2024-10-21 NOTE — Progress Notes (Signed)
 Heart Failure Nurse Navigator Progress Note  PCP: Pcp, No PCP-Cardiologist: None  Admission Diagnosis: None Admitted from: Home  Presentation:   Bobby Horn presented with left sided chest pain, left arm pain and back pain. Shortness of breath and nausea, vomiting, dizziness the evening before. BP 113/102, HR 108, BNP 921.4, Troponin 4,132, Creat 1.42.UDS + cocaine, EKG w/ SR, ST depressions in the inferior leads, anterolateral leads with T wave inversions. These are new compared to prior EKG. . CXR showed pulmonary vascular congestion. . required 5L [place] to maintain his oxygen saturation above 88% .Patient taken emergently for cardiac catheterization in the setting of ongoing ischemic chest discomfort. Cardiac catheterization showed mild nonobstructive disease, no evidence of Takotsubo cardiomyopathy. LVEDP 17. Recommended medical management.    Patient was educated on the sign and symptoms of heart failure, daily weights, when to call his doctor or go to the ED. Diet/ fluid restrictions, taking all medications as prescribed, attending all medical appointments and the importance of substance cessation. Patient verbalized his understanding of all education.A HF TOC appointment was scheduled for 10/25/2024 @ 9:45 am.   ECHO/ LVEF: 20-25%   Clinical Course:  Past Medical History:  Diagnosis Date   Acute HF (heart failure) (HCC) 10/21/2024   Alcohol abuse    Angina    Anxiety    Arthritis    Atrial fibrillation (HCC)    Chronic health problem 10/20/2024   Chronic pain syndrome    Narcotic seeking behavior with numerous requests for pain medications, IV dilaudid    Coronary artery disease    GERD (gastroesophageal reflux disease)    Gout    from alcohol   Gout    Headache(784.0)    Hypertension    Hypertension    Nephrolithiasis 04/2011   Left ureteral stone   NSTEMI (non-ST elevated myocardial infarction) (HCC) 10/20/2024   NSTEMI (non-ST elevated myocardial infarction) (HCC)  11/04/2011   IMO SNOMED Dx Update Oct 2024    Shortness of breath    Supraventricular tachycardia    Longstanding,  s/p ablation 05/2009 with LV bypass tract, then with 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) requiring emergent cardioversion and s/p accessory pathway ablation 06/2010   Tobacco abuse      Social History   Socioeconomic History   Marital status: Widowed    Spouse name: Not on file   Number of children: Not on file   Years of education: Not on file   Highest education level: Not on file  Occupational History   Not on file  Tobacco Use   Smoking status: Every Day    Current packs/day: 0.25    Average packs/day: 0.3 packs/day for 20.0 years (5.0 ttl pk-yrs)    Types: Cigarettes   Smokeless tobacco: Never   Tobacco comments:    Previously 2 ppd, now 0.25 ppd  Vaping Use   Vaping status: Never Used  Substance and Sexual Activity   Alcohol use: Yes    Comment: States he has not drank since the ablation in 2011   Drug use: No   Sexual activity: Not on file  Other Topics Concern   Not on file  Social History Narrative   ** Merged History Encounter **       Lives in [place] with his wife. 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 Having problems with insurance, medical access   Social Drivers of Health   Financial Resource Strain: Not on file  Food Insecurity: Food Insecurity Present (10/20/2024)   Hunger  Vital Sign    Worried About Programme Researcher, Broadcasting/film/video in the Last Year: Often true    Ran Out of Food in the Last Year: Often true  Transportation Needs: Unmet Transportation Needs (10/20/2024)   PRAPARE - Administrator, Civil Service (Medical): Yes    Lack of Transportation (Non-Medical): Yes  Physical Activity: Not on file  Stress: Not on file  Social Connections: Not on file   Education Assessment and Provision:  Detailed education and instructions provided on heart failure disease management including the  following:  Signs and symptoms of Heart Failure When to call the physician Importance of daily weights Low sodium diet Fluid restriction Medication management Anticipated future follow-up appointments  Patient education given on each of the above topics.  Patient acknowledges understanding via teach back method and acceptance of all instructions.  Education Materials:  Living Better With Heart Failure Booklet, HF zone tool, & Daily Weight Tracker Tool.  Patient has scale at home: Yes Patient has pill box at home: Na    High Risk Criteria for Readmission and/or Poor Patient Outcomes: Heart failure hospital admissions (last 6 months): 0  No Show rate: 3 %  Difficult social situation: No, Lives alone  Demonstrates medication adherence: Yes Primary Language: English  Literacy level: Reading, writing, and comprehension.   Barriers of Care:   Substance abuse Diet/ fluid retention/ daily weights Smoking cessation   Considerations/Referrals:   Referral made to Heart Failure Pharmacist Stewardship: Yes Referral made to Heart Failure CSW/NCM TOC: NA Referral made to Heart & Vascular TOC clinic: Yes, 10/25/2024 @ 9:45 am   Items for Follow-up on DC/TOC: Continued HF education  Diet/ fluid restrictions/ daily weights Substance abuse cessation    Stephane Haddock, BSN, RN Heart Failure Teacher, Adult Education Only

## 2024-10-21 NOTE — Progress Notes (Signed)
  Progress Note  Patient Name: Bobby Horn Date of Encounter: 10/21/2024 [facility] Cardiologist: None   Interval Summary   No further chest pain or shortness of breath. Feels much better this morning. Discussed his use of alcohol, cocaine at length.   Vital Signs Vitals:   10/21/24 0759 10/21/24 0800 10/21/24 0815 10/21/24 0830  BP:  98/85 100/77 105/83  Pulse:  94 90 89  Resp:  17 17 15   Temp: 98.6 F (37 C)     TempSrc: Oral     SpO2:  93% 95% 93%  Weight:      Height:        Intake/Output Summary (Last 24 hours) at 10/21/2024 0911 Last data filed at 10/21/2024 0800 Gross per 24 hour  Intake 1027.28 ml  Output 3400 ml  Net -2372.72 ml      10/21/2024    5:00 AM 10/20/2024   12:14 PM 08/08/2024    9:40 PM  Last 3 Weights  Weight (lbs) 206 lb 5.6 oz 221 lb 240 lb 1.3 oz  Weight (kg) 93.6 kg 100.245 kg 108.9 kg      Telemetry/ECG  Sinus rhythm with PVC's, few ventricular couplets and triplets. No sustained arrhythmia.  - Personally Reviewed  Physical Exam  GEN: No acute distress.   Neck: No JVD Cardiac: RRR, no murmurs, rubs, or gallops.  Respiratory: Clear to auscultation bilaterally. GI: Soft, nontender, non-distended  MS: No edema.  Right radial cath site clear.  Assessment & Plan  1.  Non-STEMI: Patient taken emergently for cardiac catheterization in the setting of ongoing ischemic chest discomfort.  Cardiac catheterization showed tight stenosis of a septal perforator but no significant stenoses in his major epicardial coronary arteries.  Recommendations include: DAPT with aspirin  and clopidogrel High intensity statin drug Labetalol  added in the setting of cocaine use based on nonselectivity.  2.  Acute systolic heart failure: LVEF less than 30%, suspect related to alcohol and cocaine.  Extensive counseling done this morning on need for cessation.  LVEDP 17 mmHg at cardiac catheterization after receiving IV Lasix.  Recommend: DC home  amlodipine Started on labetalol  Blood pressure is soft but will try him on a low-dose of losartan 12.5 mg daily.  I do not think he would tolerate Entresto at this point due to his borderline blood pressure.  3.  Disposition: From cardiac perspective, patient stable to transfer out of CV-ICU.  Medication changes as outlined above.  Alcohol/cocaine cessation counseling done.  Likely discharge tomorrow as long as clinically stable.    For questions or updates, please contact [facility] Please consult [email or web address] for contact info under         Signed, Ozell Fell, MD

## 2024-10-21 NOTE — Assessment & Plan Note (Signed)
 Echo shows LVEF less than 30%, suspect related to alcohol and cocaine. LVEDP 17 mmHg at cardiac catheterization after receiving IV Lasix.  - Heart Failure consult and will follow - DC home amlodipine - Start a low-dose of losartan 12.5 mg daily.

## 2024-10-21 NOTE — Progress Notes (Signed)
 Received a call from hospitalist team that Bobby Horn is continuing to experience chest pain. At the time of the call, he was on 45 mcg/hr of nitroglycerin  with systolic blood pressures ranging from 92-105 mmHg. At this point in the night he was euvolemic and diuresed 2.3 L of UOP with lasix. I evaluated the patient at bedside. Discussed with internationalist, he will be taken to the cath lab for uncontrolled chest pain. Internal medicine team was notified.   Merlene Blood, MD MS  Cardiology Moon[employer]

## 2024-10-21 NOTE — Assessment & Plan Note (Signed)
 Last drink 11/2 AM Continue CIWA protocol - Ativan  as needed - Continue Librium as ordered - Continue Folic acid 1 mg daily - Multivitamin w/ minerals Daily - Vitamin B1 100 mg daily

## 2024-10-21 NOTE — Progress Notes (Addendum)
 Daily Progress Note Intern Pager: [phone]  Patient name: Bobby Horn Medical record number: [id number] Date of birth: March 14, 1966 Age: 58 y.o. Gender: male  Primary Care Provider: Pcp, No Consultants: Cardiology, Heart Failure Navigation Team  Code Status: Full  Pt Overview and Major Events to Date:  11/2 : Admitted for NSTEMI. Angiography OVN w/ show multiple vessels disease LAD, 2nd Septal branch(99%) , Left Circumflex (10%), and RCA  Assessment and Plan: Bobby Horn is a 58 YO male presented w/ CP and SOB. Now s/p angiography on 10/20/24 which not required further interventions.  Assessment & Plan NSTEMI (non-ST elevated myocardial infarction) (HCC) - Vital signs per floor - Heart Healthy diet  - Cardiology consult , appreciate recommendations. - Continue ACS protocol : Aspirin  81 mg daily, Atorvastatin 80 mg Daily  - Continue Plavix 75 mg daily w/ breakfast  - Continue Lasix 20 mg daily  - Continue Labetalol  100 mg BID in the setting of cocaine use based on nonselectivity.  - Strict I&O - Pain : Tylenol  650 mg Q6H PRN, Morphine  IV 1 mg Q4H PRN - Continue bowel regimen : Senna 8.6 mg BID - PT/OT continue follow - Daily AM CBC/BMP  - Fall precautions - Delirium precautions  Acute systolic heart failure (HCC) Echo shows LVEF less than 30%, suspect related to alcohol and cocaine. LVEDP 17 mmHg at cardiac catheterization after receiving IV Lasix.  - Heart Failure consult and will follow - DC home amlodipine - Start a low-dose of losartan 12.5 mg daily.  Alcohol abuse Last drink 11/2 AM Continue CIWA protocol - Ativan  as needed - Continue Librium as ordered - Continue Folic acid 1 mg daily - Multivitamin w/ minerals Daily - Vitamin B1 100 mg daily Polycythemia Elevated H/H : 22.1/65 on admission w/ Hx of non-prescription testosterone  use  Curbside Heme : no indication beside stop testosterone  at this time - Daily CBC Chronic health problem HTN : Home amLODipine  10 mg daily (discontinue) D/t cardiac status : starting patient on  Atrial fibrillation : currently not in A-fib : s/p ablation GERD : Protonix  Daily  Gout : Home ibuprofen  (ADVIL ) 800 MG tablet TID on hold Chronic pain syndrome : Continue SUBOXONE 8-2 MG FILM Q8Hs    FEN/GI: Heart Healthy Diet  PPx: Lovenox, SCD Dispo:Pending PT recommendations  pending clinical improvement .    Subjective:  Patient is doing well this morning. Denies HA or chest pain. State his  body is coming back together.   Objective: Temp:  [97.7 F (36.5 C)-99.1 F (37.3 C)] 98.6 F (37 C) (11/03 0759) Pulse Rate:  [89-163] 89 (11/03 0745) Resp:  [1-42] 16 (11/03 0745) BP: (82-153)/(55-134) 96/70 (11/03 0745) SpO2:  [87 %-100 %] 92 % (11/03 0745) Weight:  [93.6 kg-100.2 kg] 93.6 kg (11/03 0500) Physical Exam: General:  He is well-developed  Cardiovascular: Normal Rhythm and Rate Respiratory: No distress on RA Abdomen: Soft Last BM 11/2 Neurology : Ox3  Laboratory: Most recent CBC Lab Results  Component Value Date   WBC 10.6 (H) 10/21/2024   HGB 17.1 (H) 10/21/2024   HCT 48.5 10/21/2024   MCV 97.8 10/21/2024   PLT 199 10/21/2024   Most recent BMP    Latest Ref Rng & Units 10/21/2024    4:29 AM  BMP  Glucose 70 - 99 mg/dL 886   BUN 6 - 20 mg/dL 21   Creatinine 9.38 - 1.24 mg/dL 8.50   Sodium 864 - 854 mmol/L 132   Potassium  3.5 - 5.1 mmol/L 3.5   Chloride 98 - 111 mmol/L 96   CO2 22 - 32 mmol/L 25   Calcium 8.9 - 10.3 mg/dL 7.9    Imaging/Diagnostic Tests: None  Suzen Houston NOVAK, DO 10/21/2024, 8:15 AM  PGY-1, [facility] FPTS Intern pager: [phone], text pages welcome Secure chat group [facility] Teaching Service

## 2024-10-21 NOTE — Telephone Encounter (Signed)
 Patient Advocate Encounter  Test billing for this patient's current coverage ([employer]) returns a $4 copay for 90 day supply of Entresto (DAW). Farxiga and Jardiance both require prior authorization under this coverage.  This test claim was processed through [facility]- copay amounts may vary at other pharmacies due to pharmacy/plan contracts, or as the patient moves through the different stages of their insurance plan.  Rachel DEL, CPhT Rx Patient Advocate Phone: [phone]

## 2024-10-22 ENCOUNTER — Other Ambulatory Visit: Payer: Self-pay | Admitting: Student

## 2024-10-22 ENCOUNTER — Other Ambulatory Visit: Payer: Self-pay | Admitting: Family Medicine

## 2024-10-22 DIAGNOSIS — I214 Non-ST elevation (NSTEMI) myocardial infarction: Secondary | ICD-10-CM | POA: Diagnosis not present

## 2024-10-22 LAB — CBC WITH DIFFERENTIAL/PLATELET
Abs Immature Granulocytes: 0.03 K/uL (ref 0.00–0.07)
Basophils Absolute: 0.1 K/uL (ref 0.0–0.1)
Basophils Relative: 1 %
Eosinophils Absolute: 0.1 K/uL (ref 0.0–0.5)
Eosinophils Relative: 2 %
HCT: 50.2 % (ref 39.0–52.0)
Hemoglobin: 17.2 g/dL — ABNORMAL HIGH (ref 13.0–17.0)
Immature Granulocytes: 0 %
Lymphocytes Relative: 25 %
Lymphs Abs: 1.7 K/uL (ref 0.7–4.0)
MCH: 34.3 pg — ABNORMAL HIGH (ref 26.0–34.0)
MCHC: 34.3 g/dL (ref 30.0–36.0)
MCV: 100.2 fL — ABNORMAL HIGH (ref 80.0–100.0)
Monocytes Absolute: 0.5 K/uL (ref 0.1–1.0)
Monocytes Relative: 7 %
Neutro Abs: 4.4 K/uL (ref 1.7–7.7)
Neutrophils Relative %: 65 %
Platelets: 185 K/uL (ref 150–400)
RBC: 5.01 MIL/uL (ref 4.22–5.81)
RDW: 12.5 % (ref 11.5–15.5)
WBC: 6.9 K/uL (ref 4.0–10.5)
nRBC: 0 % (ref 0.0–0.2)

## 2024-10-22 LAB — COMPREHENSIVE METABOLIC PANEL WITH GFR
ALT: 16 U/L (ref 0–44)
AST: 24 U/L (ref 15–41)
Albumin: 2.9 g/dL — ABNORMAL LOW (ref 3.5–5.0)
Alkaline Phosphatase: 48 U/L (ref 38–126)
Anion gap: 10 (ref 5–15)
BUN: 24 mg/dL — ABNORMAL HIGH (ref 6–20)
CO2: 28 mmol/L (ref 22–32)
Calcium: 8.5 mg/dL — ABNORMAL LOW (ref 8.9–10.3)
Chloride: 98 mmol/L (ref 98–111)
Creatinine, Ser: 1.64 mg/dL — ABNORMAL HIGH (ref 0.61–1.24)
GFR, Estimated: 48 mL/min — ABNORMAL LOW (ref >60)
Glucose, Bld: 102 mg/dL — ABNORMAL HIGH (ref 70–99)
Potassium: 4.7 mmol/L (ref 3.5–5.1)
Sodium: 136 mmol/L (ref 135–145)
Total Bilirubin: 0.6 mg/dL (ref 0.0–1.2)
Total Protein: 5.8 g/dL — ABNORMAL LOW (ref 6.5–8.1)

## 2024-10-22 LAB — HEMOGLOBIN A1C
Hgb A1c MFr Bld: 4.3 % — ABNORMAL LOW (ref 4.8–5.6)
Mean Plasma Glucose: 76.71 mg/dL

## 2024-10-22 LAB — LIPID PANEL
Cholesterol: 164 mg/dL (ref 0–200)
HDL: 44 mg/dL (ref >40)
LDL Cholesterol: 95 mg/dL (ref 0–99)
Total CHOL/HDL Ratio: 3.7 ratio
Triglycerides: 124 mg/dL (ref <150)
VLDL: 25 mg/dL (ref 0–40)

## 2024-10-22 LAB — LIPOPROTEIN A (LPA): Lipoprotein (a): 30.6 nmol/L — ABNORMAL HIGH (ref <75.0)

## 2024-10-22 LAB — MAGNESIUM: Magnesium: 2.1 mg/dL (ref 1.7–2.4)

## 2024-10-22 MED ORDER — FUROSEMIDE 20 MG PO TABS: 20.0000 mg | ORAL_TABLET | Freq: Every day | ORAL | 0 refills | Status: AC

## 2024-10-22 MED ORDER — LOSARTAN POTASSIUM 25 MG PO TABS
12.5000 mg | ORAL_TABLET | Freq: Every day | ORAL | 0 refills | Status: AC
Start: 2024-10-22 — End: ?

## 2024-10-22 MED ORDER — CLOPIDOGREL BISULFATE 75 MG PO TABS: 75.0000 mg | ORAL_TABLET | Freq: Every day | ORAL | 0 refills | Status: AC

## 2024-10-22 MED ORDER — CARVEDILOL 3.125 MG PO TABS: 3.1250 mg | ORAL_TABLET | Freq: Two times a day (BID) | ORAL | 0 refills | Status: AC

## 2024-10-22 MED ORDER — VITAMIN B-1 100 MG PO TABS: 100.0000 mg | ORAL_TABLET | Freq: Every day | ORAL | 0 refills | Status: AC

## 2024-10-22 MED ORDER — FUROSEMIDE 20 MG PO TABS: 20.0000 mg | ORAL_TABLET | Freq: Every day | ORAL | 0 refills | Status: DC | PRN

## 2024-10-22 MED ORDER — ASPIRIN 81 MG PO CHEW: 81.0000 mg | CHEWABLE_TABLET | Freq: Every day | ORAL | 0 refills | Status: AC

## 2024-10-22 MED ORDER — ATORVASTATIN CALCIUM 80 MG PO TABS: 80.0000 mg | ORAL_TABLET | Freq: Every day | ORAL | 0 refills | Status: AC

## 2024-10-22 NOTE — Progress Notes (Signed)
 CSW addressed substance use consult and placed community resources on patients AVS.   Luise Pan, MSW, LCSWA Transitions of Care [phone]

## 2024-10-22 NOTE — Plan of Care (Signed)
 Pt very agitated this morning and ready to discharge. MD was informed of growing anxiety, pt was given medication, listened to music, and walked around hallways to help keep him calm. The last 30 min the patient became more aggressive and wanted to leave. MD completed discharge paperwork, given to patient and he left the floor with his ride.

## 2024-10-22 NOTE — Progress Notes (Signed)
   Heart Failure Stewardship Pharmacist Progress Note   PCP: Pcp, No PCP-Cardiologist: None    HPI:  58 yo M with PMH of afib, tobacco use, and self-reported MI and PCI.   Presented to the ED on 11/2 with chest pain, L arm pain, and back pain. Associated with shortness of breath, orthopnea, PND, diaphoresis, N/V, and dizziness. EKG with ST depression. Troponin and BNP elevated. Admitted for NSTEMI and CHF. CXR with interstitial edema. UDS positive for cocaine. ECHO 11/2 with LVEF 20-25%, no RWMA, RV moderately reduced, mild MR. Taken to the cath lab with uncontrolled chest pain. Found to have mild nonobstructive disease, no evidence of Takotsubo cardiomyopathy. LVEDP 17. Recommended medical management.   Denies shortness of breath or chest pain. No LE edema on exam. Denies lightheadedness or dizziness. Continued HF education today. Planned discharge today.   Current HF Medications: Diuretic: furosemide 20 mg PO daily Beta Blocker: carvedilol 3.125 mg BID ACE/ARB/ARNI: losartan 12.5 mg daily  Prior to admission HF Medications: None  Pertinent Lab Values: Serum creatinine 1.49>1.64, BUN 24, Potassium 4.7, Sodium 136, BNP 921.4, Magnesium  2.1  Vital Signs: Weight: 210 lbs (admission weight: estimated 221 lbs) Blood pressure: 110/80s  Heart rate: 80-90s  I/O: net -1.9L yesterday; net -3.6L since admission  Medication Assistance / Insurance Benefits Check: Does the patient have prescription insurance?  Yes Type of insurance plan: [place] Medicaid  Outpatient Pharmacy:  Prior to admission outpatient pharmacy: [facility] Is the patient willing to use [facility] TOC pharmacy at discharge? Yes Is the patient willing to transition their outpatient pharmacy to utilize a [facility] outpatient pharmacy?   No    Assessment: 1. Acute systolic CHF (LVEF 20-25%), due to NICM. NYHA class II symptoms. - Continue furosemide 20 mg PO daily. Strict I/Os and daily weights. Keep K>4 and Mg>2. - Continue  carvedilol 3.125 mg BID - preferred BB with HFrEF and cocaine use.  - Continue low dose losartan 12.5 mg daily - Consider MRA and SGLT2i as outpatient when renal function improves   Plan: 1) Medication changes recommended at this time: - None - Add MRA and SGLT2i at follow up  2) Patient assistance: - Entresto copay $4 - Farxiga/Jardiance require prior authorization  3)  Education  - Patient has been educated on current HF medications and potential additions to HF medication regimen - Patient verbalizes understanding that over the next few months, these medication doses may change and more medications may be added to optimize HF regimen - Patient has been educated on basic disease state pathophysiology and goals of therapy    Duwaine Plant, PharmD, BCPS Heart Failure Stewardship Pharmacist Phone [phone]

## 2024-10-22 NOTE — TOC Transition Note (Signed)
 Transition of Care [facility]) - Discharge Note   Patient Details  Name: Bobby Horn MRN: [id number] Date of Birth: 1966/07/31  Transition of Care [facility]) CM/SW Contact:  Waddell Barnie Rama, RN Phone Number: 10/22/2024, 10:33 AM   Clinical Narrative:    He is  for dc today, he goes to a suboxone clinic, [place] , he just went last Tuesday Oct 28, so he has a another apt at the end of November.  He has transportation home at [employer].         Patient Goals and CMS Choice            Discharge Placement                       Discharge Plan and Services Additional resources added to the After Visit Summary for                                       Social Drivers of Health (SDOH) Interventions SDOH Screenings   Food Insecurity: Food Insecurity Present (10/20/2024)  Housing: Unknown (10/21/2024)  Recent Concern: Housing - High Risk (10/20/2024)  Transportation Needs: No Transportation Needs (10/21/2024)  Recent Concern: Transportation Needs - Unmet Transportation Needs (10/20/2024)  Utilities: At Risk (10/20/2024)  Alcohol Screen: Low Risk  (10/21/2024)  Financial Resource Strain: Low Risk  (10/21/2024)  Tobacco Use: High Risk (10/20/2024)     Readmission Risk Interventions     No data to display

## 2024-10-22 NOTE — Progress Notes (Addendum)
 Progress Note  Patient Name: Bobby Horn Date of Encounter: 10/22/2024 [facility] HeartCare Cardiologist: None   Interval Summary   Patient is very anxious to leave this morning. He denies any chest pain, shortness of breath. He states that he understands he needs to stay away from alcohol and drugs.   Vital Signs Vitals:   10/22/24 0429 10/22/24 0547 10/22/24 0800 10/22/24 0805  BP: 102/81  (!) 144/112 (!) 155/137  Pulse: 90  95 (!) 40  Resp: 17  20   Temp: 97.8 F (36.6 C)  97.9 F (36.6 C)   TempSrc: Oral  Oral   SpO2: 96%  93%   Weight:  95.3 kg    Height:        Intake/Output Summary (Last 24 hours) at 10/22/2024 0816 Last data filed at 10/22/2024 0438 Gross per 24 hour  Intake 1200 ml  Output 2400 ml  Net -1200 ml      10/22/2024    5:47 AM 10/21/2024    5:00 AM 10/20/2024   12:14 PM  Last 3 Weights  Weight (lbs) 210 lb 1.6 oz 206 lb 5.6 oz 221 lb  Weight (kg) 95.3 kg 93.6 kg 100.245 kg      Telemetry/ECG  No significant data as patient is discharging today and tele is off. Sinus rhythm  - Personally Reviewed  Physical Exam  GEN: No acute distress.   Neck: No JVD Cardiac: regular rate and rhythm , no murmurs Respiratory: Clear to auscultation bilaterally. GI: Soft, nontender, non-distended  MS: No edema  Assessment & Plan  Non- STEMI: Patient is POD 2 s/p left heart catheterization. Patient is chest pain free today        Continue DAPT with aspirin  81 mg and clopidogrel 75 mg         Continue atorvastatin 80 mg  2. Acute systolic heart failure with EF less than 30%:  Switched labetalol  to carvedilol 3.125 mg BID  Continue lasix 20 mg daily  Started on losartan 12.5 mg daily. Cr elevated from baseline but given starting losartan and s/p LHC, this is expected.Should get BMP outpatient to assess kidney function and titrate GDMT as tolerated Counseled on cessation of alcohol and cocaine   [facility] will sign off.   The patient is ready for  discharge today from a cardiac standpoint. Medication Recommendations:  Losartan 12.5 mg daily, carvedilol 3.125 mg BID, atorvastatin 80 mg , aspirin  81 mg, plavix 75 mg, furosemide 20 mg daily  Other recommendations (labs, testing, etc):  BMP at follow up Follow up as an outpatient:  within 2 weeks For questions or updates, please contact [facility] Please consult www.[email or web address].com for contact info under         Signed, Rosalyn D'Mello, DO    Patient seen, examined. Available data reviewed. Agree with findings, assessment, and plan as outlined by Dr Kem.  The patient is independently interviewed and examined.  He is alert, oriented, in no distress.  HEENT is normal, JVP is normal, lungs are clear bilaterally, heart is regular rate rhythm no murmur gallop, abdomen is soft and nontender, extremities have no edema, radial cath site is clear.  All available data is reviewed as above.  The patient's current medical regimen includes low doses of carvedilol and losartan as well as a low-dose loop diuretic with furosemide 20 mg daily.  Due to his elevated cardiac markers consistent with non-STEMI, he will be prescribed DAPT with aspirin  and clopidogrel to be continued  through 12 months.  Of note he was found to have no obstructive CAD in his major epicardial vessels.  He is placed on a high intensity statin drug.  As an outpatient, it would be reasonable to consider transitioning him from losartan to Entresto if his blood pressure will tolerate.  Will arrange outpatient cardiology follow-up.  Patient appears medically stable for discharge.  He understands the importance of alcohol and drug cessation.  Ozell Fell, M.D. 10/22/2024 10:41 AM

## 2024-10-22 NOTE — Discharge Instructions (Addendum)
 Thank you for letting us  care for you during your stay.  You were admitted to the [facility] Medicine Teaching Service.   You were admitted for NSTEMI  We found the following during your stay: Left Heart Cath shows LAD, 2nd Septal branch(99%) , Left Circumflex (10%), and RCA   We recommend follow up specifically for your kidney functions from Acute Kidney Injury.   Other important diagnoses identified during your stay were NTEMI.   Please follow up with your primary care physician in 1 week.   If your symptoms worsen or return, please return to the hospital.  Please let us  know if you have questions about your stay at [facility].                                         Outpatient Substance Abuse  Treatment- uninsured  Narcotics Anonymous 24-HOUR HELPLINE Pre-recorded for Meeting Schedules [facility] [phone]  [email or web address] ALCOHOLICS ANONYMOUS  [place]   Answering Service [phone] Please Note: All [place] Meetings are Non-smoking [email or web address]  Alcohol and Drug Services -  Insurance: Medicaid /State funding/private insurance Methadone, suboxone/Intensive outpatient  [place]   [phone] Fax: [phone] [address] [place], [place], [address] [place] [phone] Fax: [phone]    [address] [place], [place], [address] ([address] [place], [place], [place], [place], [place], [place], [place], [place]) [facility] [email or web address] Accepts State funding/Medicaid Transitional housing, Intensive Outpatient Treatment, Outpatient treatment, Veterans Services  Phone: [phone] Fax: [phone] Address: [address], [place] [address]   [facility] [employer] of Care (http://[email or web address].info/) Insurance: Medicaid Case Management, Administrator, Arts, Medication Management, Outpatient Therapy, Psychosocial Rehabilitation, Substance Abuse Intensive Outpatient  Phone: [phone]  Fax: [phone] [address], [place], [place], [address]  [facility] Medicaid, most private insurance providers Types of Program: Individual/Group Therapy, Substance Abuse Treatment  Phone: [place] [phone] Fax: [phone] [address], [place], [place], [address] [place] [phone] [address], [address] [place], [place], [address]  [facility]  Medicaid Types of Program: SAIOP  Phone: [phone] Fax: [phone] [address] [place], [place], [place], [address] [facility] Medicaid/state funds Crisis line [phone] [place] [phone] [place] [phone] [place] [phone]  [facility] [address];  [place], [place] [address] [phone]  Substance Abuse Intensive Outpatient Program [facility] [address] [place], [place] [address] [phone]- Substance abuse treatment  Successful Transitions  Insurance: [facility], [address], sliding scale Types of Program: substance abuse treatment, transportation assistance Phone: [phone] Fax: [phone] Address: [address], [place] [address] [facility] (trendswap.[email or web address]) Insurance: UHC, [place], [place], [place] Program: addiction counseling, detoxification,  Phone: [phone]  Fax: [phone] Address: [address] [place], [place] [address]  [facility] (statewide facilities/programs) [address] (Medicaid/state funds) [place], [place] [address]                      Http://[email or web address]/ [phone] [place] mcalpine- [phone] [place]- [phone] [facility] (2 Locations) (Medicaid/state funds) --[address]  walk in 8:30-12 and 1-2:30 [place], [id number]   [facility]- [phone] --[address] [place], [place] [address]  [phone] walk in 8:30-12 and  2-3:30  [facility] state funds/medicaid [address] [place], [place] [address] [phone] [facility] (Suboxone clinic) Medicaid/state funds  [address] [place], [place] [address] [phone]   [facility]  [address] [place], [place] [address]  [phone] (24 hours) [place]- [address] [place], [place] [address]  [phone] (24 hours) [place]- [address] [place] [phone] [place]- [address] [place] [phone] [place] [address] [place] [phone] [facility]- Medicaid and state funds  [place]- [address] [place], [place] [address] [phone] (24 hours) [place]- [address] [place], [place] [address] [phone] [facility]- [address] [place], [place] [address] [phone] (24 hours) [place] [address] [place], [place]  [address] [phone] [place]- [address] [place] [phone]

## 2024-10-22 NOTE — Discharge Summary (Addendum)
 Family Medicine Teaching [facility] Discharge Summary  Patient name: Bobby Horn Medical record number: [id number] Date of birth: 02-04-1966 Age: 58 y.o. Gender: male Date of Admission: 10/20/2024  Date of Discharge: 10/22/24 Admitting Physician: [place] KATHEE Samuels, DO  Primary Care Provider: Pcp, No Consultants: Cardiology,Heart Failure Navigation Team   Indication for Hospitalization: NSTEMI  Discharge Diagnoses/Problem List: NSTEMI Principal Problem for Admission:  Other Problems addressed during stay:  Principal Problem:   NSTEMI (non-ST elevated myocardial infarction) [facility]) Active Problems:   Alcohol abuse   Chronic health problem   Polycythemia   Acute systolic heart failure (HCC)   The above problem list has been updated and reviewed for accuracy, including the initial reason for admission.   Brief Hospital Course:  Bobby Horn is a 58 y.o.male with a history of  substance abuse, alcohol abuse, HTN, A-fib s/p ablation who was [facility] to [facility] for chest pain and SOB. His hospital course is detailed below:  NSTEMI (non-ST elevated myocardial infarction) Bobby Horn presented at the ED c/o worsening chest pain. In the ED, patient hemodynamically stable; troponin 1,655. EKG w/ ST depression. Cardiology was consulted and recommended IV Lasix 80 mg once then Lasix gtt 10 mg/hr; NTG drip and started ACS protocol with aspirin  load followed by aspirin  81 mg once daily, high intensity statin and heparin  drip. TTE obtained showing EF 20-25%. LHC and RHC was completed which showed LAD, 2nd Septal branch(99%) , Left Circumflex (10%), and RCA. No intervention required. Coreg 3.125 mg BID and Losartan 12.5 mg Daily, Plavix 75 mg and ASA daily, and Lipitor 80 mg daily were started by cardiology prior to discharge. Lasix 20 mg daily was continued at discharge. Patient diuresed 3.5 L this admission with discharge weight of 210 lbs.   Polycythemia:  Hgb on  admission 22.2 with hematocrit of 65. Patient reported taking testosterone  outpatient. Hgb at discharge 17.2 with HCT of 50.2. Recommend discontinuing testosterone  outpatient.   Alcohol Use Last drink 11/2. Patient started on Librium taper. CIWAs monitored with Ativan  prn. Supplemented with folic acid, Vitamin B1, multivitamin.  AKI Elevated BUN and sCr during this admission. Still making urine w/o difficulty. Suspect underlying kidney disease at baseline with initial creatinine at 1.3. Continued ARB for added heart benefits as above. If Cr continues to rise, consider stopping outpatient.   Other chronic conditions were medically managed with home medications and formulary alternatives as necessary (HTN, GERD, gout, chronic pain)  PCP Follow-up Recommendations: Discontinue testosterone  outpatient F/u w/ smoking cessation  Ensure follow-up with cardiology Repeat BMP w/ GFR for kidney function d/t AKI    Results/Tests Pending at Time of Discharge:  Unresulted Labs (From admission, onward)     Start     Ordered   10/21/24 0500  Lipoprotein A (LPA)  Tomorrow morning,   R        10/20/24 2302   10/20/24 2246  [facility] Clopidogrel Test  Once,   R       Question Answer Comment  Preferred Collection Location In Lab   Do you confirm that the patient will be informed of, and will provide consent to, genetic testing, genomic sequencing, and [facility] ongoing storage of their genetic information and sample for future use? Yes   Reason for Testing Diagnosis Pre-Symptomatic   What payment method should be used for this genetic test? Institutional bill      10/20/24 2246  Disposition: Home for self care  Discharge Condition: Good  Discharge Exam:  Vitals:   10/22/24 0800 10/22/24 0805  BP: (!) 144/112 (!) 155/137  Pulse: 95 96  Resp: 20 20  Temp: 97.9 F (36.6 C)   SpO2: 93% 100%   Physical Exam Cardiovascular:     Rate and Rhythm: Normal rate and  regular rhythm.  Pulmonary:     Effort: Pulmonary effort is normal.     Breath sounds: Examination of the right-lower field reveals decreased breath sounds. Examination of the left-lower field reveals decreased breath sounds. Decreased breath sounds present.  Skin:    General: Skin is warm and dry.  Neurological:     General: No focal deficit present.      Significant Procedures: Angiography   Significant Labs and Imaging:  Recent Labs  Lab 10/20/24 2124 10/21/24 0429 10/22/24 0209  WBC  --  10.6* 6.9  HGB 19.1* 17.1* 17.2*  HCT 55.5* 48.5 50.2  PLT  --  199 185   Recent Labs  Lab 10/21/24 0429 10/22/24 0209  NA 132* 136  K 3.5 4.7  CL 96* 98  CO2 25 28  GLUCOSE 113* 102*  BUN 21* 24*  CREATININE 1.49* 1.64*  CALCIUM 7.9* 8.5*  MG 1.6* 2.1  ALKPHOS 56 48  AST 38 24  ALT 17 16  ALBUMIN 2.7* 2.9*     Discharge Medications:  Allergies as of 10/22/2024   No Known Allergies      Medication List     STOP taking these medications    aspirin  EC 81 MG tablet Replaced by: aspirin  81 MG chewable tablet       TAKE these medications    aspirin  81 MG chewable tablet Chew 1 tablet (81 mg total) by mouth daily. Replaces: aspirin  EC 81 MG tablet   atorvastatin 80 MG tablet Commonly known as: LIPITOR Take 1 tablet (80 mg total) by mouth daily.   carvedilol 3.125 MG tablet Commonly known as: COREG Take 1 tablet (3.125 mg total) by mouth 2 (two) times daily with a meal.   clopidogrel 75 MG tablet Commonly known as: PLAVIX Take 1 tablet (75 mg total) by mouth daily with breakfast.   furosemide 20 MG tablet Commonly known as: LASIX Take 1 tablet (20 mg total) by mouth daily.   losartan 25 MG tablet Commonly known as: COZAAR Take 0.5 tablets (12.5 mg total) by mouth daily.   Suboxone 8-2 MG Film Generic drug: Buprenorphine HCl-Naloxone  HCl Take 1 Film by mouth in the morning, at noon, and at bedtime.   thiamine 100 MG tablet Commonly known as:  Vitamin B-1 Take 1 tablet (100 mg total) by mouth daily.   zolpidem  10 MG tablet Commonly known as: AMBIEN  Take 10 mg by mouth at bedtime.        Discharge Instructions: Please refer to Patient Instructions section of EMR for full details.  Patient was counseled important signs and symptoms that should prompt return to medical care, changes in medications, dietary instructions, activity restrictions, and follow up appointments.   Follow-Up Appointments:  Follow-up Information     [facility]. Go today.   Specialty: Cardiology Why: Hospital follow up 10/25/2024 @ 9:45 am PLEASE bring a current medication list to appointment  FREE valet parking, Entrance C, off [address] [facility] [employer] and [employer] entrance Contact information: [address] [place]  [phone]  Donzetta Rollene BRAVO, MD 10/22/2024, 3:35 PM PGY-3, [facility] Family Medicine

## 2024-10-25 ENCOUNTER — Inpatient Hospital Stay (HOSPITAL_COMMUNITY)

## 2024-10-25 NOTE — Progress Notes (Incomplete)
 HEART & VASCULAR TRANSITION OF CARE CONSULT NOTE     Referring Physician: Pcp, No   Chief Complaint:   HPI: Referred to clinic by *** for heart failure consultation.   Bobby Horn is a 58 y.o. male with history of hx SVT s/p ablation in 2010 and 2011, nicotine use (vapes), cocaine and ETOH abuse, hx prior MI per patient report (unable to find this in chart).   Patient was admitted in 11/25 with acute hypoxic respiratory failure in setting of new systolic heart failure and NSTEMI. UDS + for cocaine. Echo with LVEF 20-25%, RV moderately reduced.  LHC with 99% small septal second branch but just mild CAD of epicardial arteries. EF 20-25% on LV gram with LVEDP of 17 mmHg. 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 GDMT limited by blood pressure.    Past Medical History:  Diagnosis Date   Acute HF (heart failure) (HCC) 10/21/2024   Alcohol abuse    Angina    Anxiety    Arthritis    Atrial fibrillation (HCC)    Chronic health problem 10/20/2024   Chronic pain syndrome    Narcotic seeking behavior with numerous requests for pain medications, IV dilaudid    Coronary artery disease    GERD (gastroesophageal reflux disease)    Gout    from alcohol   Gout    Headache(784.0)    Hypertension    Hypertension    Nephrolithiasis 04/2011   Left ureteral stone   NSTEMI (non-ST elevated myocardial infarction) (HCC) 10/20/2024   NSTEMI (non-ST elevated myocardial infarction) (HCC) 11/04/2011   IMO SNOMED Dx Update Oct 2024    Shortness of breath    Supraventricular tachycardia    Longstanding,  s/p ablation 05/2009 with LV bypass tract, then with 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 90 tablet 0    atorvastatin (LIPITOR) 80 MG tablet Take 1 tablet (80 mg total) by mouth daily. 90 tablet 0   carvedilol (COREG) 3.125 MG tablet Take 1 tablet (3.125 mg total) by mouth 2 (two) times daily with a meal. 180 tablet 0   clopidogrel (PLAVIX) 75 MG tablet Take 1 tablet (75 mg total) by mouth daily with breakfast. 90 tablet 0   furosemide (LASIX) 20 MG tablet Take 1 tablet (20 mg total) by mouth daily. 30 tablet 0   losartan (COZAAR) 25 MG tablet Take 0.5 tablets (12.5 mg total) by mouth daily. 90 tablet 0   SUBOXONE 8-2 MG FILM Take 1 Film by mouth in the morning, at noon, and at bedtime.     thiamine (VITAMIN B-1) 100 MG tablet Take 1 tablet (100 mg total) by mouth daily. 90 tablet 0   zolpidem  (AMBIEN ) 10 MG tablet Take 10 mg by mouth at bedtime.     No current facility-administered medications for this visit.    No Known Allergies    Social History   Socioeconomic History   Marital status: Widowed    Spouse name: Not on file   Number of children: 5   Years of education: Not on file   Highest education level: 9th grade  Occupational History   Occupation: Disability  Tobacco Use   Smoking status: Every Day    Current packs/day: 0.25  Average packs/day: 0.3 packs/day for 20.0 years (5.0 ttl pk-yrs)    Types: Cigarettes   Smokeless tobacco: Never   Tobacco comments:    Previously 2 ppd, now 0.25 ppd  Vaping Use   Vaping status: Some Days   Substances: Nicotine, Flavoring  Substance and Sexual Activity   Alcohol use: Yes    Comment: States he has not drank since the ablation in 2011   Drug use: Yes    Types: Cocaine    Comment: last used 11/2   Sexual activity: Not on file  Other Topics Concern   Not on file  Social History Narrative   ** Merged History Encounter **       Lives in [place] with his wife. 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 Having problems with insurance, medical access   Social Drivers of Health   Financial Resource Strain: Low Risk   (10/21/2024)   Overall Financial Resource Strain (CARDIA)    Difficulty of Paying Living Expenses: Not very hard  Food Insecurity: Food Insecurity Present (10/20/2024)   Hunger Vital Sign    Worried About Running Out of Food in the Last Year: Often true    Ran Out of Food in the Last Year: Often true  Transportation Needs: No Transportation Needs (10/21/2024)   PRAPARE - Administrator, Civil Service (Medical): No    Lack of Transportation (Non-Medical): No  Recent Concern: Transportation Needs - Unmet Transportation Needs (10/20/2024)   PRAPARE - Administrator, Civil Service (Medical): Yes    Lack of Transportation (Non-Medical): Yes  Physical Activity: Not on file  Stress: Not on file  Social Connections: Not on file  Intimate Partner Violence: Not At Risk (10/20/2024)   Humiliation, Afraid, Rape, and Kick questionnaire    Fear of Current or Ex-Partner: No    Emotionally Abused: No    Physically Abused: No    Sexually Abused: No      Family History  Problem Relation Age of Onset   Diabetes Mother        No known heart disease, didn't know his father   Coronary artery disease Mother    Other Other        grandmother-hx of palpitations    There were no vitals filed for this visit.  PHYSICAL EXAM: General:  Well appearing. No respiratory difficulty HEENT: normal Neck: supple. no JVD. Carotids 2+ bilat; no bruits. No lymphadenopathy or thryomegaly appreciated. Cor: PMI nondisplaced. Regular rate & rhythm. No rubs, gallops or murmurs. Lungs: clear Abdomen: soft, nontender, nondistended. No hepatosplenomegaly. No bruits or masses. Good bowel sounds. Extremities: no cyanosis, clubbing, rash, edema Neuro: alert & oriented x 3, cranial nerves grossly intact. moves all 4 extremities w/o difficulty. Affect pleasant.  ECG:   ASSESSMENT & PLAN: HFrEF -NICM. 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 Volume *** -If EF does not recover with GDMT and cessation of substance use, would consider cMRI.  2. Recent NSTEMI -In setting of cocaine use -LHC as above -On DAPT with aspirin  and plavix X 1 year, *** statin  3. Hx SVT -S/p ablations in 2010 and 2011 -Hx afib noted in chart but I do not find documentation of this   Referred to HFSW (PCP, Medications, Transportation, ETOH Abuse, Drug Abuse, Insurance, Financial ): Yes or No Refer to Pharmacy: Yes or No Refer to Home Health:  Yes on No Refer to Advanced Heart Failure Clinic: Yes or no  Refer to General Cardiology: Yes or No  Follow up

## 2024-10-28 ENCOUNTER — Ambulatory Visit (HOSPITAL_COMMUNITY)
Admission: RE | Admit: 2024-10-28 | Discharge: 2024-10-28 | Disposition: A | Discharge status: Home / Self Care | Source: Ambulatory Visit

## 2024-10-28 LAB — HELIX PHARMACOGENOMICS (PGX) CLOPIDOGREL TEST

## 2024-10-28 NOTE — Progress Notes (Incomplete)
 HEART & VASCULAR TRANSITION OF CARE CONSULT NOTE   Referring Physician: PCP: Pcp, No  Cardiologist: None  HPI: Referred to clinic by *** for heart failure consultation.   Bobby Horn is a 58 y.o. male with history of hx SVT s/p ablation in 2010 and 2011, nicotine use (vapes), cocaine and ETOH abuse, hx prior MI per patient report (unable to find this in chart).   Patient was admitted in 11/25 with acute hypoxic respiratory failure in setting of new systolic heart failure and NSTEMI. UDS + for cocaine. Echo with LVEF 20-25%, RV moderately reduced.  LHC with 99% small septal second branch but just mild CAD of epicardial arteries. EF 20-25% on LV gram with LVEDP of 17 mmHg. 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 GDMT limited by blood pressure.  Today he  presents for transition of care visit. Overall feeling ***. NYHA ***. Reports {Symptoms; cardiac:12860::dyspnea,fatigue}. Denies {Symptoms; cardiac:12860::chest pain,dyspnea,fatigue,near-syncope,orthopnea,palpitations,dizziness,abnormal bleeding}. Able to perform ADLs. Appetite okay. Weight at home ***. BP at home***. Compliant with all medications. Denies ETOH, tobacco, or drug use.    Past Medical History:  Diagnosis Date   Acute HF (heart failure) (HCC) 10/21/2024   Alcohol abuse    Angina    Anxiety    Arthritis    Atrial fibrillation (HCC)    Chronic health problem 10/20/2024   Chronic pain syndrome    Narcotic seeking behavior with numerous requests for pain medications, IV dilaudid    Coronary artery disease    GERD (gastroesophageal reflux disease)    Gout    from alcohol   Gout    Headache(784.0)    Hypertension    Hypertension    Nephrolithiasis 04/2011   Left ureteral stone   NSTEMI (non-ST elevated myocardial infarction) (HCC) 10/20/2024   NSTEMI (non-ST elevated myocardial infarction) (HCC) 11/04/2011   IMO SNOMED Dx Update Oct 2024    Shortness of breath     Supraventricular tachycardia    Longstanding,  s/p ablation 05/2009 with LV bypass tract, then with 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 90 tablet 0   atorvastatin (LIPITOR) 80 MG tablet Take 1 tablet (80 mg total) by mouth daily. 90 tablet 0   carvedilol (COREG) 3.125 MG tablet Take 1 tablet (3.125 mg total) by mouth 2 (two) times daily with a meal. 180 tablet 0   clopidogrel (PLAVIX) 75 MG tablet Take 1 tablet (75 mg total) by mouth daily with breakfast. 90 tablet 0   furosemide (LASIX) 20 MG tablet Take 1 tablet (20 mg total) by mouth daily. 30 tablet 0   losartan (COZAAR) 25 MG tablet Take 0.5 tablets (12.5 mg total) by mouth daily. 90 tablet 0   SUBOXONE 8-2 MG FILM Take 1 Film by mouth in the morning, at noon, and at bedtime.     thiamine (VITAMIN B-1) 100 MG tablet Take 1 tablet (100 mg total) by mouth daily. 90 tablet 0   zolpidem  (AMBIEN ) 10 MG tablet Take 10 mg by mouth at bedtime.     No current facility-administered medications for this visit.    No Known Allergies    Social History   Socioeconomic History   Marital status: Widowed    Spouse name: Not on file  Number of children: 5   Years of education: Not on file   Highest education level: 9th grade  Occupational History   Occupation: Disability  Tobacco Use   Smoking status: Every Day    Current packs/day: 0.25    Average packs/day: 0.3 packs/day for 20.0 years (5.0 ttl pk-yrs)    Types: Cigarettes   Smokeless tobacco: Never   Tobacco comments:    Previously 2 ppd, now 0.25 ppd  Vaping Use   Vaping status: Some Days   Substances: Nicotine, Flavoring  Substance and Sexual Activity   Alcohol use: Yes    Comment: States he has not drank since the  ablation in 2011   Drug use: Yes    Types: Cocaine    Comment: last used 11/2   Sexual activity: Not on file  Other Topics Concern   Not on file  Social History Narrative   ** Merged History Encounter **       Lives in [place] with his wife. 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 Having problems with insurance, medical access   Social Drivers of Health   Financial Resource Strain: Low Risk  (10/21/2024)   Overall Financial Resource Strain (CARDIA)    Difficulty of Paying Living Expenses: Not very hard  Food Insecurity: Food Insecurity Present (10/20/2024)   Hunger Vital Sign    Worried About Running Out of Food in the Last Year: Often true    Ran Out of Food in the Last Year: Often true  Transportation Needs: No Transportation Needs (10/21/2024)   PRAPARE - Administrator, Civil Service (Medical): No    Lack of Transportation (Non-Medical): No  Recent Concern: Transportation Needs - Unmet Transportation Needs (10/20/2024)   PRAPARE - Administrator, Civil Service (Medical): Yes    Lack of Transportation (Non-Medical): Yes  Physical Activity: Not on file  Stress: Not on file  Social Connections: Not on file  Intimate Partner Violence: Not At Risk (10/20/2024)   Humiliation, Afraid, Rape, and Kick questionnaire    Fear of Current or Ex-Partner: No    Emotionally Abused: No    Physically Abused: No    Sexually Abused: No      Family History  Problem Relation Age of Onset   Diabetes Mother        No known heart disease, didn't know his father   Coronary artery disease Mother    Other Other        grandmother-hx of palpitations    There were no vitals filed for this visit.  PHYSICAL EXAM: General: Well appearing. No distress on RA Cardiac: JVP ***. S1 and S2 present. No murmurs or rub. Resp: Lung sounds clear and equal B/L Abdomen: Soft, non-tender, non-distended.  Extremities: Warm and dry.  *** edema.  Neuro: Alert and oriented x3. Affect  pleasant. Moves all extremities without difficulty.  ECG:   ASSESSMENT & PLAN: HFrEF - NICM. Etiology most likely cocaine/ETOH - Echo 11/25: EF 20-25%, RV moderately reduced - LHC 11/25: mild epicardial disease, 99% small second septal branch treated medically - NYHA ***. Volume *** - If EF does not recover with GDMT and cessation of substance use, would consider cMRI.  Recent NSTEMI - In setting of cocaine use - LHC as above - On DAPT with aspirin  and plavix X 1 year, *** statin  Hx SVT - S/p ablations in 2010 and 2011 - Hx afib noted in chart but I do not find documentation of this  Referred to HFSW (PCP, Medications, Transportation, ETOH Abuse, Drug Abuse, Insurance, Financial ): Yes or No Refer to Pharmacy: Yes or No Refer to Home Health: Yes on No Refer to Advanced Heart Failure Clinic: Yes or no  Refer to General Cardiology: Yes or No  Iisha Soyars, NP 10/28/24

## 2024-11-08 ENCOUNTER — Ambulatory Visit: Attending: Nurse Practitioner | Admitting: Nurse Practitioner

## 2024-11-08 NOTE — Progress Notes (Deleted)
 Office Visit    Patient Name: Bobby Horn Date of Encounter: 11/08/2024  Primary Care Provider:  Pcp, No Primary Cardiologist:  None  Chief Complaint    58 year old male with a history of mild nonobstructive CAD, NICM, SVT s/p ablation in 2010 and 2011, hypertension, hyperlipidemia, GERD, gout, nicotine use (vapes), cocaine and ETOH abuse who presents for hospital follow-up related to CAD and cardiomyopathy s/p NSTEMI.  Past Medical History    Past Medical History:  Diagnosis Date   Acute HF (heart failure) (HCC) 10/21/2024   Alcohol abuse    Angina    Anxiety    Arthritis    Atrial fibrillation (HCC)    Chronic health problem 10/20/2024   Chronic pain syndrome    Narcotic seeking behavior with numerous requests for pain medications, IV dilaudid    Coronary artery disease    GERD (gastroesophageal reflux disease)    Gout    from alcohol   Gout    Headache(784.0)    Hypertension    Hypertension    Nephrolithiasis 04/2011   Left ureteral stone   NSTEMI (non-ST elevated myocardial infarction) (HCC) 10/20/2024   NSTEMI (non-ST elevated myocardial infarction) (HCC) 11/04/2011   IMO SNOMED Dx Update Oct 2024    Shortness of breath    Supraventricular tachycardia    Longstanding,  s/p ablation 05/2009 with LV bypass tract, then with 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) requiring emergent cardioversion and s/p accessory pathway ablation 06/2010   Tobacco abuse    Past Surgical History:  Procedure Laterality Date   CARDIAC CATHETERIZATION  07/01/2010   KNEE ARTHROPLASTY     left   LEFT HEART CATH AND CORONARY ANGIOGRAPHY N/A 10/20/2024   Procedure: LEFT HEART CATH AND CORONARY ANGIOGRAPHY;  Surgeon: Wendel Lurena POUR, MD;  Location: [facility] INVASIVE [facility];  Service: Cardiovascular;  Laterality: N/A;   TOOTH EXTRACTION N/A 12/24/2021   Procedure: DENTAL RESTORATION/EXTRACTIONS;  Surgeon: Sheryle Hamilton, DMD;  Location: [facility] OR;  Service: Oral Surgery;   Laterality: N/A;    Allergies  No Known Allergies   Labs/Other Studies Reviewed    The following studies were reviewed today:  Cardiac Studies & Procedures   ______________________________________________________________________________________________ CARDIAC CATHETERIZATION  CARDIAC CATHETERIZATION 10/20/2024  Conclusion   2nd Sept lesion is 99% stenosed.   Prox Cx to Mid Cx lesion is 10% stenosed.  1.  Mild nonobstructive disease of major epicardial arteries.  Of note there is a 99% lesion of the second small septal branch.  This is associated with TIMI-3 flow. 2.  Ventriculography demonstrating global hypokinesis with with an ejection fraction of approximately 20 to 25% with no evidence of Takotsubo cardiomyopathy.  The LVEDP was 17 mmHg.  Recommendation: Continue medical therapy for cocaine induced chest pain; will start Imdur  30mg  and labetalol  100mg  BID.  Will discontinue nitroglycerin  and heparin  infusions.  Will start ASA 81 and Plavix  75mg  for medical treatment of ACS.  Patient to be admitted to the [facility] unit.  Findings Coronary Findings Diagnostic  Dominance: Right  Left Anterior Descending The vessel exhibits minimal luminal irregularities.  Second Septal Branch 2nd Sept lesion is 99% stenosed.  Left Circumflex Prox Cx to Mid Cx lesion is 10% stenosed.  Right Coronary Artery The vessel exhibits minimal luminal irregularities.  Intervention  No interventions have been documented.   STRESS TESTS  NM MYOCAR MULTI W/SPECT W 10/05/2010  Narrative Clinical Data:  Chest pain and shortness of breath.  The patient is a smoker.  DUAL ISOTOPE MYOCARDIAL  IMAGING WITH SPECT (REST AND PHARMACOLOGIC- STRESS) GATED LEFT VENTRICULAR WALL MOTION STUDY LEFT VENTRICULAR EJECTION FRACTION  Technique:  Standard myocardial SPECT imaging was performed after resting intravenous administration of 10 mCi Tl-201 chloride. Subsequently, intravenous infusion of Lexiscan was  performed under the supervision of the Cardiology staff. At peak effect of the drug,  30 mCi Tc-78m tetrofosmin was injected and myocardial SPECT imaging was performed.  Quantitative gated imaging was also performed to evaluate left ventricular wall motion, and estimate left ventricular ejection fraction.  Comparison:  None.  Findings:  No fixed or reversible perfusion defect is identified. Wall motion appears normal.  Ejection fraction is 46%.  End- diastolic volume is 149 ml and end-systolic volume and 81 ml.  IMPRESSION: Negative for perfusion defect.  Slightly decreased ejection fraction of 46% noted.  Provider: Channing Arias, Newell Lesches, Amber Pitts   ECHOCARDIOGRAM  ECHOCARDIOGRAM COMPLETE 10/20/2024  Narrative ECHOCARDIOGRAM REPORT    Patient Name:   Bobby Horn Date of Exam: 10/20/2024 Medical Rec #:  [id number]      Height:       75.0 in Accession #:    [id number]     Weight:       221.0 lb Date of Birth:  January 26, 1966      BSA:          2.290 m Patient Age:    58 years       BP:           141/103 mmHg Patient Gender: M              HR:           110 bpm. Exam Location:  Inpatient  Procedure: 2D Echo, 3D Echo, Cardiac Doppler, Color Doppler and Strain Analysis (Both Spectral and Color Flow Doppler were utilized during procedure).  STAT ECHO  Indications:    NSTEMI  History:        Patient has no prior history of Echocardiogram examinations. Signs/Symptoms:Chest Pain; Risk Factors:Current Smoker and Hypertension.  Sonographer:    Philomena Daring Referring Phys: [id number] NATHAN PICKERING   Sonographer Comments: Global longitudinal strain was attempted. IMPRESSIONS   1. Left ventricular ejection fraction, by estimation, is 20 to 25%. Left ventricular ejection fraction by 3D volume is 23 %. The left ventricle has severely decreased function. The left ventricle has no regional wall motion abnormalities. Indeterminate diastolic filling due to E-A fusion. The  average left ventricular global longitudinal strain is -5.6 %. The global longitudinal strain is abnormal. 2. Right ventricular systolic function is moderately reduced. The right ventricular size is normal. There is normal pulmonary artery systolic pressure. 3. The mitral valve is normal in structure. Mild mitral valve regurgitation. No evidence of mitral stenosis. 4. The aortic valve has an indeterminant number of cusps. Aortic valve regurgitation is not visualized. No aortic stenosis is present. 5. The inferior vena cava is normal in size with greater than 50% respiratory variability, suggesting right atrial pressure of 3 mmHg.  Comparison(s): No prior Echocardiogram.  FINDINGS Left Ventricle: Left ventricular ejection fraction, by estimation, is 20 to 25%. Left ventricular ejection fraction by 3D volume is 23 %. The left ventricle has severely decreased function. The left ventricle has no regional wall motion abnormalities. The average left ventricular global longitudinal strain is -5.6 %. Strain was performed and the global longitudinal strain is abnormal. The left ventricular internal cavity size was normal in size. There is no left ventricular hypertrophy. Indeterminate diastolic filling due to E-A  fusion.  Right Ventricle: The right ventricular size is normal. No increase in right ventricular wall thickness. Right ventricular systolic function is moderately reduced. There is normal pulmonary artery systolic pressure. The tricuspid regurgitant velocity is 2.55 m/s, and with an assumed right atrial pressure of 3 mmHg, the estimated right ventricular systolic pressure is 29.0 mmHg.  Left Atrium: Left atrial size was normal in size.  Right Atrium: Right atrial size was normal in size.  Pericardium: There is no evidence of pericardial effusion.  Mitral Valve: The mitral valve is normal in structure. Mild mitral valve regurgitation. No evidence of mitral valve stenosis.  Tricuspid Valve: The  tricuspid valve is normal in structure. Tricuspid valve regurgitation is trivial. No evidence of tricuspid stenosis.  Aortic Valve: The aortic valve has an indeterminant number of cusps. Aortic valve regurgitation is not visualized. No aortic stenosis is present.  Pulmonic Valve: The pulmonic valve was not well visualized. Pulmonic valve regurgitation is not visualized. No evidence of pulmonic stenosis.  Aorta: The aortic root is normal in size and structure.  Venous: The inferior vena cava is normal in size with greater than 50% respiratory variability, suggesting right atrial pressure of 3 mmHg.  IAS/Shunts: No atrial level shunt detected by color flow Doppler.  Additional Comments: 3D was performed not requiring image post processing on an independent workstation and was abnormal.   LEFT VENTRICLE PLAX 2D LVIDd:         5.80 cm         Diastology LVIDs:         4.80 cm         LV e' medial:    7.07 cm/s LV PW:         1.10 cm         LV E/e' medial:  9.7 LV IVS:        1.10 cm         LV e' lateral:   9.03 cm/s LVOT diam:     2.20 cm         LV E/e' lateral: 7.6 LV SV:         33 LV SV Index:   14              2D Longitudinal LVOT Area:     3.80 cm        Strain 2D Strain GLS   -5.6 % Avg:  3D Volume EF LV 3D EF:    Left ventricul ar ejection fraction by 3D volume is 23 %.  3D Volume EF: 3D EF:        23 % LV EDV:       266 ml LV ESV:       205 ml LV SV:        61 ml  RIGHT VENTRICLE             IVC RV Basal diam:  3.20 cm     IVC diam: 1.90 cm RV Mid diam:    2.40 cm RV S prime:     12.00 cm/s TAPSE (M-mode): 1.9 cm  LEFT ATRIUM             Index        RIGHT ATRIUM           Index LA diam:        3.70 cm 1.62 cm/m   RA Area:     12.00 cm LA Vol (A2C):   66.5 ml 29.05 ml/m  RA Volume:   25.10 ml  10.96 ml/m LA Vol (A4C):   44.9 ml 19.61 ml/m LA Biplane Vol: 55.6 ml 24.28 ml/m AORTIC VALVE LVOT Vmax:   65.50 cm/s LVOT Vmean:  43.500 cm/s LVOT VTI:     0.087 m  AORTA Ao Root diam: 3.30 cm  MITRAL VALVE               TRICUSPID VALVE MV Area (PHT): 6.32 cm    TR Peak grad:   26.0 mmHg MV Decel Time: 120 msec    TR Vmax:        255.00 cm/s MV E velocity: 68.70 cm/s MV A velocity: 68.70 cm/s  SHUNTS MV E/A ratio:  1.00        Systemic VTI:  0.09 m Systemic Diam: 2.20 cm  Vishnu Priya Mallipeddi Electronically signed by Diannah Late Mallipeddi Signature Date/Time: 10/20/2024/3:46:25 PM    Final          ______________________________________________________________________________________________     Recent Labs: 10/20/2024: B Natriuretic Peptide 921.4 10/22/2024: ALT 16; BUN 24; Creatinine, Ser 1.64; Hemoglobin 17.2; Magnesium  2.1; Platelets 185; Potassium 4.7; Sodium 136  Recent Lipid Panel    Component Value Date/Time   CHOL 164 10/22/2024 0209   TRIG 124 10/22/2024 0209   HDL 44 10/22/2024 0209   CHOLHDL 3.7 10/22/2024 0209   VLDL 25 10/22/2024 0209   LDLCALC 95 10/22/2024 0209    History of Present Illness   58 year old male with the above past medical history including mild nonobstructive CAD, NICM, SVT s/p ablation in 2010 and 2011, hypertension, hyperlipidemia, GERD, gout, nicotine use (vapes), cocaine and ETOH abuse.   History of SVT s/p catheter ablation x 2.  Last seen by Dr. Waddell in 2013.  Patient was admitted in 11/25 with acute hypoxic respiratory failure in setting of new systolic heart failure and NSTEMI. UDS + for cocaine. Echo with LVEF 20-25%, RV moderately reduced.  LHC with 99% small septal second branch but just mild CAD of epicardial arteries. EF 20-25% on LV gram with LVEDP of 17 mmHg. 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 GDMT was limited in the setting of hypotension.  He was discharged home in stable condition 10/22/2024.  He was referred to the advanced heart failure TOC clinic.  He presents today for follow-up.  Since his hospitalization   Nonobstructive  CAD: NICM: SVT: Hypertension: Hyperlipidemia: Polysubstance abuse: Disposition:  Home Medications    Current Outpatient Medications  Medication Sig Dispense Refill   aspirin  81 MG chewable tablet Chew 1 tablet (81 mg total) by mouth daily. 90 tablet 0   atorvastatin  (LIPITOR ) 80 MG tablet Take 1 tablet (80 mg total) by mouth daily. 90 tablet 0   carvedilol  (COREG ) 3.125 MG tablet Take 1 tablet (3.125 mg total) by mouth 2 (two) times daily with a meal. 180 tablet 0   clopidogrel  (PLAVIX ) 75 MG tablet Take 1 tablet (75 mg total) by mouth daily with breakfast. 90 tablet 0   furosemide  (LASIX ) 20 MG tablet Take 1 tablet (20 mg total) by mouth daily. 30 tablet 0   losartan  (COZAAR ) 25 MG tablet Take 0.5 tablets (12.5 mg total) by mouth daily. 90 tablet 0   SUBOXONE  8-2 MG FILM Take 1 Film by mouth in the morning, at noon, and at bedtime.     thiamine  (VITAMIN B-1) 100 MG tablet Take 1 tablet (100 mg total) by mouth daily. 90 tablet 0   zolpidem  (AMBIEN ) 10 MG tablet  Take 10 mg by mouth at bedtime.     No current facility-administered medications for this visit.     Review of Systems    ***.  All other systems reviewed and are otherwise negative except as noted above. {The patient has an active order for outpatient cardiac rehabilitation.   Please indicate if the patient is ready to start. Do NOT delete this.  It will auto delete.  Refresh note, then sign.              Click here to document readiness and see contraindications.  :1}  Cardiac Rehabilitation Eligibility Assessment      Physical Exam    VS:  There were no vitals taken for this visit. , BMI There is no height or weight on file to calculate BMI.     GEN: Well nourished, well developed, in no acute distress. HEENT: normal. Neck: Supple, no JVD, carotid bruits, or masses. Cardiac: RRR, no murmurs, rubs, or gallops. No clubbing, cyanosis, edema.  Radials/DP/PT 2+ and equal bilaterally.  Respiratory:  Respirations  regular and unlabored, clear to auscultation bilaterally. GI: Soft, nontender, nondistended, BS + x 4. MS: no deformity or atrophy. Skin: warm and dry, no rash. Neuro:  Strength and sensation are intact. Psych: Normal affect.  Accessory Clinical Findings    ECG personally reviewed by me today -    - no acute changes.   Lab Results  Component Value Date   WBC 6.9 10/22/2024   HGB 17.2 (H) 10/22/2024   HCT 50.2 10/22/2024   MCV 100.2 (H) 10/22/2024   PLT 185 10/22/2024   Lab Results  Component Value Date   CREATININE 1.64 (H) 10/22/2024   BUN 24 (H) 10/22/2024   NA 136 10/22/2024   K 4.7 10/22/2024   CL 98 10/22/2024   CO2 28 10/22/2024   Lab Results  Component Value Date   ALT 16 10/22/2024   AST 24 10/22/2024   ALKPHOS 48 10/22/2024   BILITOT 0.6 10/22/2024   Lab Results  Component Value Date   CHOL 164 10/22/2024   HDL 44 10/22/2024   LDLCALC 95 10/22/2024   TRIG 124 10/22/2024   CHOLHDL 3.7 10/22/2024    Lab Results  Component Value Date   HGBA1C 4.3 (L) 10/22/2024    Assessment & Plan    1.  ***  No BP recorded.  {Refresh Note OR Click here to enter BP  :1}***   Damien JAYSON Braver, NP 11/08/2024, 9:51 AM

## 2024-11-14 ENCOUNTER — Inpatient Hospital Stay (HOSPITAL_COMMUNITY)
Admission: EM | Admit: 2024-11-14 | Discharge: 2024-11-17 | DRG: 280 | Disposition: A | Discharge status: Home / Self Care | Attending: Family Medicine | Admitting: Family Medicine

## 2024-11-14 ENCOUNTER — Other Ambulatory Visit: Payer: Self-pay

## 2024-11-14 ENCOUNTER — Encounter (HOSPITAL_COMMUNITY): Payer: Self-pay

## 2024-11-14 ENCOUNTER — Emergency Department (HOSPITAL_COMMUNITY)

## 2024-11-14 DIAGNOSIS — Z7902 Long term (current) use of antithrombotics/antiplatelets: Secondary | ICD-10-CM

## 2024-11-14 DIAGNOSIS — I214 Non-ST elevation (NSTEMI) myocardial infarction: Principal | ICD-10-CM | POA: Diagnosis present

## 2024-11-14 DIAGNOSIS — I4719 Other supraventricular tachycardia: Secondary | ICD-10-CM | POA: Diagnosis present

## 2024-11-14 DIAGNOSIS — E785 Hyperlipidemia, unspecified: Secondary | ICD-10-CM | POA: Diagnosis not present

## 2024-11-14 DIAGNOSIS — I428 Other cardiomyopathies: Secondary | ICD-10-CM | POA: Diagnosis not present

## 2024-11-14 DIAGNOSIS — R079 Chest pain, unspecified: Secondary | ICD-10-CM | POA: Diagnosis not present

## 2024-11-14 DIAGNOSIS — R000 Tachycardia, unspecified: Secondary | ICD-10-CM

## 2024-11-14 DIAGNOSIS — Z789 Other specified health status: Secondary | ICD-10-CM

## 2024-11-14 DIAGNOSIS — F1490 Cocaine use, unspecified, uncomplicated: Secondary | ICD-10-CM | POA: Insufficient documentation

## 2024-11-14 DIAGNOSIS — G8929 Other chronic pain: Secondary | ICD-10-CM | POA: Diagnosis present

## 2024-11-14 DIAGNOSIS — R072 Precordial pain: Secondary | ICD-10-CM | POA: Diagnosis not present

## 2024-11-14 DIAGNOSIS — I2510 Atherosclerotic heart disease of native coronary artery without angina pectoris: Secondary | ICD-10-CM | POA: Diagnosis present

## 2024-11-14 DIAGNOSIS — I5023 Acute on chronic systolic (congestive) heart failure: Secondary | ICD-10-CM | POA: Diagnosis not present

## 2024-11-14 DIAGNOSIS — I222 Subsequent non-ST elevation (NSTEMI) myocardial infarction: Secondary | ICD-10-CM | POA: Diagnosis not present

## 2024-11-14 DIAGNOSIS — Z7982 Long term (current) use of aspirin: Secondary | ICD-10-CM

## 2024-11-14 DIAGNOSIS — F1110 Opioid abuse, uncomplicated: Secondary | ICD-10-CM | POA: Diagnosis present

## 2024-11-14 DIAGNOSIS — I472 Ventricular tachycardia, unspecified: Secondary | ICD-10-CM | POA: Diagnosis present

## 2024-11-14 DIAGNOSIS — Z96652 Presence of left artificial knee joint: Secondary | ICD-10-CM | POA: Diagnosis not present

## 2024-11-14 DIAGNOSIS — F1410 Cocaine abuse, uncomplicated: Secondary | ICD-10-CM | POA: Diagnosis not present

## 2024-11-14 DIAGNOSIS — I5021 Acute systolic (congestive) heart failure: Secondary | ICD-10-CM | POA: Diagnosis present

## 2024-11-14 DIAGNOSIS — I447 Left bundle-branch block, unspecified: Secondary | ICD-10-CM | POA: Diagnosis not present

## 2024-11-14 DIAGNOSIS — R0602 Shortness of breath: Secondary | ICD-10-CM | POA: Diagnosis not present

## 2024-11-14 DIAGNOSIS — I110 Hypertensive heart disease with heart failure: Secondary | ICD-10-CM | POA: Diagnosis present

## 2024-11-14 DIAGNOSIS — Z79899 Other long term (current) drug therapy: Secondary | ICD-10-CM | POA: Diagnosis not present

## 2024-11-14 DIAGNOSIS — Z79891 Long term (current) use of opiate analgesic: Secondary | ICD-10-CM

## 2024-11-14 DIAGNOSIS — F1090 Alcohol use, unspecified, uncomplicated: Secondary | ICD-10-CM | POA: Diagnosis not present

## 2024-11-14 DIAGNOSIS — N179 Acute kidney failure, unspecified: Secondary | ICD-10-CM

## 2024-11-14 DIAGNOSIS — I493 Ventricular premature depolarization: Secondary | ICD-10-CM | POA: Diagnosis present

## 2024-11-14 DIAGNOSIS — F17290 Nicotine dependence, other tobacco product, uncomplicated: Secondary | ICD-10-CM | POA: Diagnosis present

## 2024-11-14 LAB — CBC
HCT: 50.7 % (ref 39.0–52.0)
Hemoglobin: 17.5 g/dL — ABNORMAL HIGH (ref 13.0–17.0)
MCH: 34.2 pg — ABNORMAL HIGH (ref 26.0–34.0)
MCHC: 34.5 g/dL (ref 30.0–36.0)
MCV: 99.2 fL (ref 80.0–100.0)
Platelets: 154 K/uL (ref 150–400)
RBC: 5.11 MIL/uL (ref 4.22–5.81)
RDW: 11.8 % (ref 11.5–15.5)
WBC: 5.6 K/uL (ref 4.0–10.5)
nRBC: 0 % (ref 0.0–0.2)

## 2024-11-14 LAB — BASIC METABOLIC PANEL WITH GFR
Anion gap: 13 (ref 5–15)
BUN: 18 mg/dL (ref 6–20)
CO2: 22 mmol/L (ref 22–32)
Calcium: 8.5 mg/dL — ABNORMAL LOW (ref 8.9–10.3)
Chloride: 99 mmol/L (ref 98–111)
Creatinine, Ser: 1.38 mg/dL — ABNORMAL HIGH (ref 0.61–1.24)
GFR, Estimated: 59 mL/min — ABNORMAL LOW (ref >60)
Glucose, Bld: 291 mg/dL — ABNORMAL HIGH (ref 70–99)
Potassium: 4 mmol/L (ref 3.5–5.1)
Sodium: 134 mmol/L — ABNORMAL LOW (ref 135–145)

## 2024-11-14 LAB — BRAIN NATRIURETIC PEPTIDE: B Natriuretic Peptide: 264.5 pg/mL — ABNORMAL HIGH (ref 0.0–100.0)

## 2024-11-14 LAB — TROPONIN I (HIGH SENSITIVITY)
Troponin I (High Sensitivity): 83 ng/L — ABNORMAL HIGH (ref <18)
Troponin I (High Sensitivity): 1427 ng/L (ref <18)
Troponin I (High Sensitivity): 3991 ng/L (ref <18)

## 2024-11-14 MED ORDER — AMIODARONE IV BOLUS ONLY 150 MG/100ML
150.0000 mg | Freq: Once | INTRAVENOUS | Status: AC
  Administered 2024-11-14: 150 mg via INTRAVENOUS
  Filled 2024-11-14: qty 100

## 2024-11-14 MED ORDER — ADULT MULTIVITAMIN W/MINERALS CH
1.0000 | ORAL_TABLET | Freq: Every day | ORAL | Status: DC
  Administered 2024-11-14 – 2024-11-17 (×4): 1 via ORAL
  Filled 2024-11-14 (×4): qty 1

## 2024-11-14 MED ORDER — THIAMINE HCL 100 MG/ML IJ SOLN
100.0000 mg | Freq: Every day | INTRAMUSCULAR | Status: DC
  Filled 2024-11-14: qty 2

## 2024-11-14 MED ORDER — ASPIRIN 81 MG PO CHEW
81.0000 mg | CHEWABLE_TABLET | Freq: Every day | ORAL | Status: DC
  Administered 2024-11-15 – 2024-11-17 (×3): 81 mg via ORAL
  Filled 2024-11-14 (×3): qty 1

## 2024-11-14 MED ORDER — HEPARIN (PORCINE) 25000 UT/250ML-% IV SOLN
1500.0000 [IU]/h | INTRAVENOUS | Status: AC
  Administered 2024-11-14: 1200 [IU]/h via INTRAVENOUS
  Administered 2024-11-15: 1350 [IU]/h via INTRAVENOUS
  Administered 2024-11-16: 1500 [IU]/h via INTRAVENOUS
  Filled 2024-11-14 (×3): qty 250

## 2024-11-14 MED ORDER — MORPHINE SULFATE (PF) 4 MG/ML IV SOLN
4.0000 mg | Freq: Once | INTRAVENOUS | Status: AC
  Administered 2024-11-14: 4 mg via INTRAVENOUS
  Filled 2024-11-14: qty 1

## 2024-11-14 MED ORDER — CARVEDILOL 3.125 MG PO TABS
3.1250 mg | ORAL_TABLET | Freq: Two times a day (BID) | ORAL | Status: DC
  Administered 2024-11-15 – 2024-11-17 (×5): 3.125 mg via ORAL
  Filled 2024-11-14 (×5): qty 1

## 2024-11-14 MED ORDER — ATORVASTATIN CALCIUM 80 MG PO TABS
80.0000 mg | ORAL_TABLET | Freq: Every day | ORAL | Status: DC
  Administered 2024-11-15 – 2024-11-17 (×3): 80 mg via ORAL
  Filled 2024-11-14 (×3): qty 1

## 2024-11-14 MED ORDER — MORPHINE SULFATE (PF) 2 MG/ML IV SOLN
2.0000 mg | INTRAVENOUS | Status: DC | PRN
  Administered 2024-11-14 – 2024-11-17 (×17): 2 mg via INTRAVENOUS
  Filled 2024-11-14 (×18): qty 1

## 2024-11-14 MED ORDER — AMIODARONE LOAD VIA INFUSION: 150.0000 mg | Freq: Once | INTRAVENOUS | Status: DC

## 2024-11-14 MED ORDER — ASPIRIN 81 MG PO CHEW
324.0000 mg | CHEWABLE_TABLET | Freq: Once | ORAL | Status: AC
  Administered 2024-11-14: 324 mg via ORAL
  Filled 2024-11-14: qty 4

## 2024-11-14 MED ORDER — SODIUM CHLORIDE 0.9% FLUSH
3.0000 mL | Freq: Two times a day (BID) | INTRAVENOUS | Status: DC
  Administered 2024-11-14 – 2024-11-17 (×4): 3 mL via INTRAVENOUS

## 2024-11-14 MED ORDER — FOLIC ACID 1 MG PO TABS
1.0000 mg | ORAL_TABLET | Freq: Every day | ORAL | Status: DC
  Administered 2024-11-14 – 2024-11-17 (×4): 1 mg via ORAL
  Filled 2024-11-14 (×4): qty 1

## 2024-11-14 MED ORDER — HYDROMORPHONE HCL 1 MG/ML IJ SOLN
1.0000 mg | Freq: Once | INTRAMUSCULAR | Status: AC
  Administered 2024-11-14: 1 mg via INTRAVENOUS
  Filled 2024-11-14: qty 1

## 2024-11-14 MED ORDER — THIAMINE MONONITRATE 100 MG PO TABS
100.0000 mg | ORAL_TABLET | Freq: Every day | ORAL | Status: DC
  Administered 2024-11-14 – 2024-11-17 (×4): 100 mg via ORAL
  Filled 2024-11-14 (×4): qty 1

## 2024-11-14 MED ORDER — HEPARIN BOLUS VIA INFUSION
4000.0000 [IU] | Freq: Once | INTRAVENOUS | Status: AC
  Administered 2024-11-14: 4000 [IU] via INTRAVENOUS
  Filled 2024-11-14: qty 4000

## 2024-11-14 NOTE — Assessment & Plan Note (Addendum)
~  1 pint every 2 days. Last drink 11/25. Previously required Librium  taper during earlier 10/2024 admission.  - CIWAs in place, inform team if ativan  indicated - Multivitamin, folic acid , thiamine  - TOC consult for substance use

## 2024-11-14 NOTE — ED Triage Notes (Signed)
 C/O right sided chest pain that started 2 hrs ago. Hx of afib. C/O SHOB/n/v.

## 2024-11-14 NOTE — Assessment & Plan Note (Addendum)
-   Admit to FMTS, attending Dr. Gwynneth Keeling - Progressive level of care, Vital signs per floor - Cardiology consulted, appreciate recommendations - NPO - Heparin  infusion started - S/p ASA 324 mg once - Holding home ASA 81 mg daily, Plavix  75 mg daily - Pain: IV morphine  2mg  PRN - S/p dilaudid  x1 and morphine  x1 - S/p amiodarone  150 mg IV once - Labs: AM CMP, CBC - trend trop - UDS pending - Repeat EKG tomorrow AM - PT/OT to treat - If patient becomes hemodynamically unstable or experiences EKG changes, reach out to cards for possible cath

## 2024-11-14 NOTE — Progress Notes (Signed)
 PHARMACY - ANTICOAGULATION CONSULT NOTE  Pharmacy Consult for heparin  gtt Indication: chest pain/ACS  No Known Allergies  Patient Measurements: Height: 6' 1 (185.4 cm) Weight: 98.4 kg (217 lb) IBW/kg (Calculated) : 79.9 HEPARIN  DW (KG): 98.4  Vital Signs: Temp: 97.6 F (36.4 C) (11/27 1403) BP: 137/96 (11/27 1403) Pulse Rate: 76 (11/27 1726)  Labs: Recent Labs    11/14/24 1416 11/14/24 1649  HGB 17.5*  --   HCT 50.7  --   PLT 154  --   CREATININE 1.38*  --   TROPONINIHS 83* 1,427*    Estimated Creatinine Clearance: 72 mL/min (A) (by C-G formula based on SCr of 1.38 mg/dL (H)).   Assessment: 58 yo M with chest pain. Pt also history of afib s/p ablation. No anticoagulation PTA.  Pharmacy consulted for heparn gtt in setting of ACS event.   Hgb 17.5, Plt 154 Trop 83>1427  Goal of Therapy:  Heparin  level 0.3-0.7 units/ml Monitor platelets by anticoagulation protocol: Yes   Plan:  Heparin  4000 units IV x1 followed by  Heparin  1200 units/hr 6hr HL Daily HL, CBC F/u s/sx bleeding and LOT  Sharyne Glatter, PharmD, BCCCP Critical Care Clinical Pharmacist 11/14/2024 5:59 PM

## 2024-11-14 NOTE — Assessment & Plan Note (Addendum)
 EF of <30% via echo done 11/2. - Cards consulted as above - GDMT: Holding home coreg  3.125 mg BID, losartan  12.5 mg daily, lasix  20 mg daily

## 2024-11-14 NOTE — Assessment & Plan Note (Addendum)
 HTN: Holding home coreg  3.125 mg BID, losartan  12.5 mg daily, lasix  20 mg daily HLD: Holding home Lipitor  80 mg daily Chronic pain: Pain medication as above Opioid Use Disorder: Holding home suboxone  TID while NPO  -- pt reports he has not taken this in several months Sleep: Holding home Ambien  10 mg at bedtime

## 2024-11-14 NOTE — H&P (Addendum)
 Hospital Admission History and Physical Service Pager: [phone]  Patient name: Bobby Horn Medical record number: [id number] Date of Birth: May 27, 1966 Age: 58 y.o. Gender: male  Primary Care Provider: Pcp, No Consultants: Cardiology Code Status: Full code Preferred Emergency Contact:  Contact Information     Name Relation Home Work Mobile   VAZQUEZ,CYNTHIA Friend   [phone]    Chief Complaint: chest pain  Differential and Medical Decision Making:  Bobby Horn is a 58 y.o. 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   Differential for this patient's presentation of chest pain includes: - NSTEMI: Strongly suspicious for this given recent hx NSTEMI at beginning of 10/2024; also with troponin increasing from 83->1427 and with EKG showing new T wave inversions. Cardiology already consulted in ED. - CHF: Known CHF with EF < 30% (echo done 11/2), but less concern for this given BNP of 264 and without signs of volume overload on exam. - Infection: Lower concern given no fever, normal white count, no sick symptoms. Lungs also without evidence of PNA on CXR.  Assessment & Plan NSTEMI (non-ST elevated myocardial infarction) (HCC) V tach (HCC) - Admit to FMTS, attending Dr. Gwynneth Keeling - Progressive level of care, Vital signs per floor - Cardiology consulted, appreciate recommendations - NPO - Heparin  infusion started - S/p ASA 324 mg once - Holding home ASA 81 mg daily, Plavix  75 mg daily - Pain: IV morphine  2mg  PRN - S/p dilaudid  x1 and morphine  x1 - S/p amiodarone  150 mg IV once - Labs: AM CMP, CBC - trend trop - UDS pending - Repeat EKG tomorrow AM - PT/OT to treat - If patient becomes hemodynamically unstable or experiences EKG changes, reach out to cards for possible cath Acute systolic heart failure (HCC) EF of <30% via echo done 11/2. - Cards consulted as above -  GDMT: Holding home coreg  3.125 mg BID, losartan  12.5 mg daily, lasix  20 mg daily Elevated Serum creatinine Cr of 1.38 on admission; baseline around 1.2 (based on reading from 03/2024). - Will continue to follow - No fluids indicated currently, consider if Cr worsening Alcohol use ~1 pint every 2 days. Last drink 11/25. Previously required Librium  taper during earlier 10/2024 admission.  - CIWAs in place, inform team if ativan  indicated - Multivitamin, folic acid , thiamine  - TOC consult for substance use Cocaine use Reports last use ~11/24. - TOC consult for substance use Chronic health problem HTN: Holding home coreg  3.125 mg BID, losartan  12.5 mg daily, lasix  20 mg daily HLD: Holding home Lipitor  80 mg daily Chronic pain: Pain medication as above Opioid Use Disorder: Holding home suboxone  TID while NPO  -- pt reports he has not taken this in several months Sleep: Holding home Ambien  10 mg at bedtime   FEN/GI: NPO VTE Prophylaxis: Heparin  infusion  Disposition: Cardiac tele  History of Present Illness:  Bobby Horn is a 58 y.o. male presenting with chest pain.  Previously hospitalized 11/2-11/4 with NSTEMI, treated with medical management.  Now presenting with substernal/left sided nonradiating chest pain since about 9am today. Worsening since then, especially with activity. Endorses nausea and vomiting x1 with the pain. No hematemesis. Feels similar to his recent NSTEMI. No dyspnea.   Denies recent fevers, chills.  In the ED, initially with slightly increased trop, stable EKG from prior. Later found to have increasing trop (83->1427) and new TWI on EKG.  Bolused heparin  and given 324mg  ASA. Also given dilaudid  x1 and morphine  x1 for pain. Given amiodarone  150 mg IV once. Cardiology consulted.   Review Of Systems: Per HPI  Pertinent Past Medical History: A fib s/p ablation NSTEMI (10/2024) CHF SVT alcohol use tobacco use OUD on suboxone  HTN Remainder reviewed in  history tab.   Pertinent Past Surgical History: Left knee replacement  Remainder reviewed in history tab.   Pertinent Social History: Tobacco use: vapes, declines nicotine patch Alcohol use: Yes, about 1 pint every 2 days. Last drink 2 days ago Other Substance use: Cocaine (inhaled), 3 days ago; THC, occasionally maybe 1/week; denies opioid use recently (on suboxone ) Lives by himself  Pertinent Family History: Mother: CAD, DM  Important Outpatient Medications: ASA 81 mg daily Plavix  75 mg daily Lipitor  80 mg daily Coreg  3.125 mg BID Losartan  12.5 mg daily Lasix  20 mg daily Thiamine  100 mg daily Suboxone  TID Ambien  10 mg at bedtime  Objective: BP 121/83   Pulse 82   Temp 98.2 F (36.8 C)   Resp 20   Ht 6' 1 (1.854 m)   Wt 98.4 kg   SpO2 96%   BMI 28.63 kg/m  Exam: General: Patient sitting up in bed, intermittently grimacing in tapping forehead, appears in pain Eyes: EOMI bilaterally Neck: Supple Cardiovascular:  Regular rate and rhythm with occasional extra beats, no murmurs/rubs/gallops.  Tender to palpation over lower sternum and into left side of chest Respiratory:  Normal work of breathing on room air. Clear to auscultation bilaterally; no wheezes, crackles. Gastrointestinal: Bowel sounds present and normoactive bilaterally. Soft, nondistended, nontender. MSK: No bilateral lower extremity edema. Derm: Skin warm, dry.  No rash seen of visible skin. Neuro: Alert and oriented to person, place, time, event. Psych: Appropriate affect.  Labs:  CBC BMET  Recent Labs  Lab 11/14/24 1416  WBC 5.6  HGB 17.5*  HCT 50.7  PLT 154   Recent Labs  Lab 11/14/24 1416  NA 134*  K 4.0  CL 99  CO2 22  BUN 18  CREATININE 1.38*  GLUCOSE 291*  CALCIUM  8.5*     Trop: 83->1427 BNP 264  EKG 1400: My own interpretation (not copied from electronic read): Sinus rhythm, occasional premature beat, T waves inverted in aVR, aVL, V4, V5 (similar to prior EKG 11/2 with now  upright T waves in V3 and V6), Qtc 457  EKG 1750: New T wave inversions in I, V2, V6  Imaging Studies Performed: CXR 11/27: Impression from Radiologist: No active cardiopulmonary disease.   My Interpretation: ?Large lung volumes, lung clear, cardiac silhouette unremarkable.   Larraine Palma, MD 11/14/2024, 8:24 PM PGY-1, [facility]  FPTS Intern pager: [phone], text pages welcome Secure chat group [facility] Teaching Service    FPTS Upper-Level Resident Addendum   I have independently interviewed and examined the patient. I have discussed the above with Dr. Larraine and agree with the documented plan. My edits for correction/addition/clarification are included above. Please see any attending notes.   Payton Coward, MD PGY-3, [facility] 11/14/2024 8:32 PM  FPTS Service pager: [phone] (text pages welcome through [facility])

## 2024-11-14 NOTE — Plan of Care (Signed)
?  Problem: Clinical Measurements: ?Goal: Respiratory complications will improve ?Outcome: Progressing ?  ?Problem: Activity: ?Goal: Risk for activity intolerance will decrease ?Outcome: Progressing ?  ?Problem: Nutrition: ?Goal: Adequate nutrition will be maintained ?Outcome: Progressing ?  ?Problem: Coping: ?Goal: Level of anxiety will decrease ?Outcome: Progressing ?  ?

## 2024-11-14 NOTE — Assessment & Plan Note (Addendum)
 Cr of 1.38 on admission; baseline around 1.2 (based on reading from 03/2024). - Will continue to follow - No fluids indicated currently, consider if Cr worsening

## 2024-11-14 NOTE — Consult Note (Signed)
   Electrophysiology consultation   Patient ID: Bobby Horn MRN: [id number]; DOB: 02-22-1966  Admit date: 11/14/2024 Date of Consult: 11/14/2024  PCP:  Pcp, No   History of Present Illness:   Bobby Horn is a 58 year old man who I am seeing today for an evaluation of chest pain and tachycardia at the request of Dr. Donzetta.  The patient has a history of alcohol and cocaine abuse, recent NSTEMI with hospitalization.  During most recent hospitalization left heart cath showed a severe stenosis of a septal perforator for which PCI was not performed.  No other significant epicardial coronary artery disease.  He tells me that he has a very long history of SVT.  He actually saw the electrophysiology team here years ago and had a two EP studies.  During each study, the accessory pathway appeared to lose function with ablation but obviously recurred given recurrence of symptoms.  During the EP study a left posterior lateral pathway was discovered. Today he reports a prolonged episode of tachycardia accompanied by severe chest pain.  The chest pain was left-sided.  No syncope or presyncope.  He tells me he has not used cocaine in several days.  He tells me that he has never had alcohol withdrawal.  He denies recent alcohol use.   Past medical, surgical, social and family history reviewed.  ROS:  Please see the history of present illness.  All other ROS reviewed and negative.     Physical Exam/Data:   Vitals:   11/14/24 1801 11/14/24 1815 11/14/24 1900 11/14/24 1915  BP:  113/75 126/84 121/83  Pulse: 82 72 77 82  Resp: 14 12 13 20   Temp:      SpO2: 99% 97% 95% 96%  Weight:      Height:        General:  Well nourished, in pain Cardiac: Tachycardic, regular rhythm   EKG:  The EKG was personally reviewed and demonstrates:   Wide-complex tachycardia with ventricular rates greater than 170 bpm. EKG in sinus shows sinus rhythm.  No clear preexcitation.  Telemetry:  Telemetry was personally  reviewed and demonstrates: Salvos of wide-complex tachycardia.  Otherwise, sinus rhythm.    Assessment and Plan:   #Wide-complex tachycardia Accompanied by chest pain.  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  Given signs of myocardial injury, recommend treating with amiodarone  in the acute phase.  If he has recurrence despite treatment with amiodarone , favor repeat EP study and ablation via transseptal approach.  I discussed the treatment plan with the patient who is agreeable.  I discussed amiodarone  in detail including the risks and need for this to not be a long-term medication.  Given his alcohol abuse history, I do not think amiodarone  should be employed long-term.  #Chest pain #NSTEMI He is now hemodynamically stable.  Chest pain has improved now that his rhythm is improved.  I suspect his pain is secondary to demand ischemia while in sustained tachycardia.  Amiodarone  as above Continue heparin  for 24 to 48 hours Trend troponins Trend EKGs should he have recurrence of chest pain Continue aspirin   #Alcohol abuse history Management per primary team  I have recommended monitoring the patient overnight and to central.  Low threshold to transfer to ICU should he have sustained recurrence of arrhythmia or become hemodynamically unstable.   Ole T. Cindie, MD, [facility], [facility] Cardiac Electrophysiology

## 2024-11-14 NOTE — Hospital Course (Addendum)
 Bobby Horn is a 58 y.o. year old with a history of A fib s/p ablation, SVT, NSTEMI (10/2024), CHF, SVT, alcohol use, tobacco use, OUD on suboxone , HTN  who presented with chest pain and was admitted to the [facility] Medicine Teaching Service for NSTEMI.  NSTEMI SVT Patient with prior hospitalization 11/2-11/4 for NSTEMI, underwent cath and no PCI indicated, treated with medical management.  Also with longstanding history of SVT.  Now presenting 11/27 with chest pain since ~9 AM, worsening.  Opponent is increasing and EKG with new T wave inversions.  Cardiology was consulted, who did not recommend cath at this time.  Suspected NSTEMI was related to his arrhythmia.  Patient received amiodarone  150 mg IV once, aspirin  324 mg once, heparin  bolus, and was started on heparin  infusion.  Per cardiology, patient continued IV amiodarone  for an additional 24 hours. Afterwards, cardiology start oral amiodarone  400 mg and monitored for 24 hours prior to discharge. Pain was controlled throughout hospitalization with medication.  Transitioned to home meds as able.  Systolic heart failure EF of <30% via echo done 11/2.  No sign of volume overload and BNP of 264 on admission, low concern for heart failure exacerbation.  GDMT was restarted as able.  Alcohol use Patient reported 1 pint every 2 days of alcohol with last drink 11/25.  CIWAs were monitored during admission.  Other chronic conditions were medically managed with home medications and formulary alternatives as necessary (cocaine use, HTN, HLD, chronic pain, opioid use disorder).  PCP Follow-up Recommendations: Encourage abstinence from alcohol, cocaine Ensure follow-up with EP outpatient

## 2024-11-14 NOTE — Assessment & Plan Note (Addendum)
 Reports last use ~11/24. - TOC consult for substance use

## 2024-11-14 NOTE — ED Provider Notes (Signed)
 [place] EMERGENCY DEPARTMENT AT [facility] Provider Note   CSN: [id number] Arrival date & time: 11/14/24  1401     Patient presents with: Chest Pain   Bobby Horn is a 58 y.o. male.  He has a history of A-fib and was recently admitted for NSTEMI, heart failure.  He said since discharge he has cut back on alcohol and cocaine and has not used any in 3 or 4 days.  Has been taking his medications.  A few hours ago began experiencing 9 out of 10 substernal and left-sided chest pain associated with shortness of breath, racing heart, diaphoresis, tingling in his hands.  Symptoms continue now.  He said it similar to his admission last month.  No fevers chills nausea vomiting.  No cough.   The history is provided by the patient.  Chest Pain Pain location:  Substernal area and L chest Pain quality: aching   Pain severity:  Severe Onset quality:  Sudden Duration:  2 hours Timing:  Constant Progression:  Unchanged Chronicity:  Recurrent Relieved by:  None tried Associated symptoms: diaphoresis, dizziness, palpitations and shortness of breath   Associated symptoms: no abdominal pain, no cough, no fever, no nausea and no vomiting   Risk factors: coronary artery disease, hypertension, male sex and smoking        Prior to Admission medications   Medication Sig Start Date End Date Taking? Authorizing Provider  aspirin  81 MG chewable tablet Chew 1 tablet (81 mg total) by mouth daily. 10/22/24   Cleotilde Perkins, DO  atorvastatin  (LIPITOR ) 80 MG tablet Take 1 tablet (80 mg total) by mouth daily. 10/22/24   Cleotilde Perkins, DO  carvedilol  (COREG ) 3.125 MG tablet Take 1 tablet (3.125 mg total) by mouth 2 (two) times daily with a meal. 10/22/24   Cleotilde Perkins, DO  clopidogrel  (PLAVIX ) 75 MG tablet Take 1 tablet (75 mg total) by mouth daily with breakfast. 10/22/24   Cleotilde Perkins, DO  furosemide  (LASIX ) 20 MG tablet Take 1 tablet (20 mg total) by mouth daily. 10/22/24   Diona Perkins, MD   losartan  (COZAAR ) 25 MG tablet Take 0.5 tablets (12.5 mg total) by mouth daily. 10/22/24   Cleotilde Perkins, DO  SUBOXONE  8-2 MG FILM Take 1 Film by mouth in the morning, at noon, and at bedtime. 06/25/21   [provider]  thiamine  (VITAMIN B-1) 100 MG tablet Take 1 tablet (100 mg total) by mouth daily. 10/22/24   Cleotilde Perkins, DO  zolpidem  (AMBIEN ) 10 MG tablet Take 10 mg by mouth at bedtime.    [provider]  metoprolol  (LOPRESSOR ) 50 MG tablet Take 50 mg by mouth 2 (two) times daily. 11/06/11 03/13/12  Gabriel Noa, MD    Allergies: Patient has no known allergies.    Review of Systems  Constitutional:  Positive for diaphoresis. Negative for fever.  Respiratory:  Positive for shortness of breath. Negative for cough.   Cardiovascular:  Positive for chest pain and palpitations.  Gastrointestinal:  Negative for abdominal pain, nausea and vomiting.  Neurological:  Positive for dizziness.    Updated Vital Signs BP (!) 137/96 (BP Location: Left Arm)   Pulse 91   Temp 97.6 F (36.4 C)   Resp 18   Ht 6' 1 (1.854 m)   Wt 98.4 kg   SpO2 100%   BMI 28.63 kg/m   Physical Exam Vitals and nursing note reviewed.  Constitutional:      Appearance: He is well-developed.  HENT:  Head: Normocephalic and atraumatic.  Eyes:     Conjunctiva/sclera: Conjunctivae normal.  Cardiovascular:     Rate and Rhythm: Normal rate and regular rhythm.     Heart sounds: Normal heart sounds. No murmur heard. Pulmonary:     Effort: Pulmonary effort is normal. No respiratory distress.     Breath sounds: Normal breath sounds.  Abdominal:     Palpations: Abdomen is soft.     Tenderness: There is no abdominal tenderness.  Musculoskeletal:     Cervical back: Neck supple.     Right lower leg: No tenderness. No edema.     Left lower leg: No tenderness. No edema.  Skin:    General: Skin is warm and dry.  Neurological:     General: No focal deficit present.     Mental Status: He is alert.      GCS: GCS eye subscore is 4. GCS verbal subscore is 5. GCS motor subscore is 6.     (all labs ordered are listed, but only abnormal results are displayed) Labs Reviewed  BASIC METABOLIC PANEL WITH GFR - Abnormal; Notable for the following components:      Result Value   Sodium 134 (*)    Glucose, Bld 291 (*)    Creatinine, Ser 1.38 (*)    Calcium  8.5 (*)    GFR, Estimated 59 (*)    All other components within normal limits  CBC - Abnormal; Notable for the following components:   Hemoglobin 17.5 (*)    MCH 34.2 (*)    All other components within normal limits  BRAIN NATRIURETIC PEPTIDE - Abnormal; Notable for the following components:   B Natriuretic Peptide 264.5 (*)    All other components within normal limits  HEPARIN  LEVEL (UNFRACTIONATED) - Abnormal; Notable for the following components:   Heparin  Unfractionated 0.25 (*)    All other components within normal limits  COMPREHENSIVE METABOLIC PANEL WITH GFR - Abnormal; Notable for the following components:   Potassium 3.1 (*)    Glucose, Bld 152 (*)    Creatinine, Ser 1.25 (*)    Calcium  8.0 (*)    Total Protein 5.5 (*)    Albumin 3.0 (*)    All other components within normal limits  CBC - Abnormal; Notable for the following components:   MCH 34.1 (*)    All other components within normal limits  TROPONIN I (HIGH SENSITIVITY) - Abnormal; Notable for the following components:   Troponin I (High Sensitivity) 83 (*)    All other components within normal limits  TROPONIN I (HIGH SENSITIVITY) - Abnormal; Notable for the following components:   Troponin I (High Sensitivity) 1,427 (*)    All other components within normal limits  TROPONIN I (HIGH SENSITIVITY) - Abnormal; Notable for the following components:   Troponin I (High Sensitivity) 3,991 (*)    All other components within normal limits  TROPONIN I (HIGH SENSITIVITY) - Abnormal; Notable for the following components:   Troponin I (High Sensitivity) 4,056 (*)    All  other components within normal limits  TROPONIN I (HIGH SENSITIVITY) - Abnormal; Notable for the following components:   Troponin I (High Sensitivity) 4,046 (*)    All other components within normal limits  TROPONIN I (HIGH SENSITIVITY) - Abnormal; Notable for the following components:   Troponin I (High Sensitivity) 3,107 (*)    All other components within normal limits  MRSA NEXT GEN BY PCR, NASAL  MAGNESIUM   RAPID URINE DRUG SCREEN, HOSP PERFORMED  HEPARIN  LEVEL (UNFRACTIONATED)    EKG: EKG Interpretation Date/Time:  Thursday November 14 2024 14:06:39 EST Ventricular Rate:  97 PR Interval:  134 QRS Duration:  96 QT Interval:  360 QTC Calculation: 457 R Axis:   70  Text Interpretation: Sinus rhythm with occasional Premature ventricular complexes Biatrial enlargement Minimal voltage criteria for LVH, may be normal variant ( Sokolow-Lyon ) Septal infarct , age undetermined T wave abnormality, consider lateral ischemia Abnormal ECG When compared with ECG of 20-Oct-2024 22:56, improved ischemic changes Confirmed by Towana Sharper [phone]) on 11/14/2024 3:54:07 PM  Radiology: [facility] Chest 2 View Result Date: 11/14/2024 CLINICAL DATA:  Left-sided chest pain and shortness of breath for 2 hours. EXAM: CHEST - 2 VIEW COMPARISON:  10/20/2024 and older exams. FINDINGS: Cardiac silhouette is normal in size configuration. Normal mediastinal and hilar contours. Clear lungs.  No pleural effusion or pneumothorax. Skeletal structures are intact. IMPRESSION: No active cardiopulmonary disease. Electronically Signed   By: Alm Parkins M.D.   On: 11/14/2024 14:45     .Critical Care  Performed by: Towana Sharper BROCKS, MD Authorized by: Towana Sharper BROCKS, MD   Critical care provider statement:    Critical care time (minutes):  45   Critical care time was exclusive of:  Separately billable procedures and treating other patients   Critical care was necessary to treat or prevent imminent or  life-threatening deterioration of the following conditions:  Cardiac failure   Critical care was time spent personally by me on the following activities:  Development of treatment plan with patient or surrogate, discussions with consultants, evaluation of patient's response to treatment, examination of patient, obtaining history from patient or surrogate, ordering and performing treatments and interventions, ordering and review of laboratory studies, ordering and review of radiographic studies, pulse oximetry, re-evaluation of patient's condition and review of old charts   I assumed direction of critical care for this patient from another provider in my specialty: no      Medications Ordered in the ED  heparin  ADULT infusion 100 units/mL (25000 units/229mL) (1,350 Units/hr Intravenous Rate/Dose Change 11/15/24 0247)  sodium chloride  flush (NS) 0.9 % injection 3 mL (3 mLs Intravenous Given 11/15/24 0855)  morphine  (PF) 2 MG/ML injection 2 mg (2 mg Intravenous Given 11/15/24 0853)  thiamine  (VITAMIN B1) tablet 100 mg (100 mg Oral Given 11/15/24 0854)    Or  thiamine  (VITAMIN B1) injection 100 mg ( Intravenous See Alternative 11/15/24 0854)  folic acid  (FOLVITE ) tablet 1 mg (1 mg Oral Given 11/15/24 0854)  multivitamin with minerals tablet 1 tablet (1 tablet Oral Given 11/15/24 0854)  aspirin  chewable tablet 81 mg (81 mg Oral Given 11/15/24 0854)  atorvastatin  (LIPITOR ) tablet 80 mg (80 mg Oral Given 11/15/24 0853)  carvedilol  (COREG ) tablet 3.125 mg (3.125 mg Oral Given 11/15/24 0853)  amiodarone  (NEXTERONE  PREMIX) 360-4.14 MG/200ML-% (1.8 mg/mL) IV infusion (has no administration in time range)  amiodarone  (NEXTERONE  PREMIX) 360-4.14 MG/200ML-% (1.8 mg/mL) IV infusion (has no administration in time range)  potassium chloride  SA (KLOR-CON  M) CR tablet 40 mEq (40 mEq Oral Given 11/15/24 0536)  magnesium  sulfate IVPB 2 g 50 mL (has no administration in time range)  morphine  (PF) 4 MG/ML injection 4  mg (4 mg Intravenous Given 11/14/24 1633)  aspirin  chewable tablet 324 mg (324 mg Oral Given 11/14/24 1632)  heparin  bolus via infusion 4,000 Units (4,000 Units Intravenous Bolus from Bag 11/14/24 1812)  HYDROmorphone  (DILAUDID ) injection 1 mg (1 mg Intravenous Given 11/14/24 1810)  amiodarone  (NEXTERONE )  1.5 mg/mL IV bolus only 150 mg (0 mg Intravenous Stopped 11/14/24 1913)    Clinical Course as of 11/15/24 0902  Thu Nov 14, 2024  1754 Patient's second troponin rising significantly.  Have ordered heparin  and placed a cardiology consult. [MB]  1807 Patient had an episode of rapid heart rate but has improved.  Ongoing chest pain. [MB]  1815 Discussed with Dr. Sheena cardiology.  She said they will see in consult but does recommend admission to family practice. [MB]  1835 Discussed with family practice team.  They will evaluate for admission.  Patient had another episode of wide-complex tachycardia.  Cardiology fellow at bedside.  Recommending starting him on an amio drip. [MB]    Clinical Course User Index [MB] Towana Ozell BROCKS, MD                                   Medical Decision Making Amount and/or Complexity of Data Reviewed Labs: ordered. Radiology: ordered.  Risk OTC drugs. Prescription drug management. Decision regarding hospitalization.   This patient complains of pain in his chest diaphoresis palpitations; this involves an extensive number of treatment Options and is a complaint that carries with it a high risk of complications and morbidity. The differential includes ACS, arrhythmia, intoxication, withdrawal, metabolic derangement  I ordered, reviewed and interpreted labs, which included CBC normal, chemistries with low potassium elevated creatinine, troponins mildly elevated but rising I ordered medication IV pain medication, heparin , amiodarone  and reviewed PMP when indicated. I ordered imaging studies which included chest x-ray and I independently    visualized and  interpreted imaging which showed no acute findings Previous records obtained and reviewed in epic including recent discharge summary I consulted cardiology Dr. Sheena and family practice teaching service and discussed lab and imaging findings and discussed disposition.  Cardiac monitoring reviewed, sinus rhythm with episodes of wide-complex tachycardia Social determinants considered, tobacco use, food and utility insecurity Critical Interventions: Initiation of heparin  and amiodarone  for patient's ACS  After the interventions stated above, I reevaluated the patient and found patient still to be in discomfort although vitals normalizing Admission and further testing considered, he would benefit from mission to hospital for further evaluation and management.  Patient in agreement with plan for admission.      Final diagnoses:  NSTEMI (non-ST elevated myocardial infarction) [facility])  Wide-complex tachycardia    ED Discharge Orders     None          Towana Ozell BROCKS, MD 11/15/24 [phone]

## 2024-11-14 NOTE — TOC CM/SW Note (Signed)
 TOC consult received for substance abuse. SW to f/u with patient as appropriate.   Merilee Batty, MSN, RN Case Management [phone]

## 2024-11-14 NOTE — Plan of Care (Signed)
 FMTS Interim Progress Note  Presented with chest pain on the left side, SOB, diaphoretic, and hand tingling. Known history of substance abuse with last cocaine use 4 days ago. Initial EKG was improved in the ED from prior admission.   Later had more chest pain and repeat EKG showed T-wave inversions and repeat troponin up-trending. On tele showed run of V-fib with self resolution. ED discussed case with Dr. Sheena, cardiologist, who will consult this admission and recommended heparin  gtt.   O: BP 113/75   Pulse 72   Temp 98.2 F (36.8 C)   Resp 12   Ht 6' 1 (1.854 m)   Wt 98.4 kg   SpO2 97%   BMI 28.63 kg/m   General: Uncomfortable-appearing Cardio: tachycardic, no murmur on exam Pulm: clear, No increased work of breathing Abdomen: Soft, bowel sounds present, nontender Extremity: No peripheral edema   Will place orders for cardiac telemetry floor.  Remainder of plan per night team and H&P  Cleotilde Perkins, DO 11/14/2024, 6:28 PM PGY-3, [facility] Family Medicine Service pager [phone]

## 2024-11-15 DIAGNOSIS — I428 Other cardiomyopathies: Secondary | ICD-10-CM | POA: Diagnosis not present

## 2024-11-15 DIAGNOSIS — F1110 Opioid abuse, uncomplicated: Secondary | ICD-10-CM | POA: Diagnosis not present

## 2024-11-15 DIAGNOSIS — I472 Ventricular tachycardia, unspecified: Secondary | ICD-10-CM | POA: Diagnosis not present

## 2024-11-15 DIAGNOSIS — E785 Hyperlipidemia, unspecified: Secondary | ICD-10-CM | POA: Diagnosis not present

## 2024-11-15 DIAGNOSIS — Z7902 Long term (current) use of antithrombotics/antiplatelets: Secondary | ICD-10-CM | POA: Diagnosis not present

## 2024-11-15 DIAGNOSIS — I222 Subsequent non-ST elevation (NSTEMI) myocardial infarction: Secondary | ICD-10-CM | POA: Diagnosis not present

## 2024-11-15 DIAGNOSIS — I447 Left bundle-branch block, unspecified: Secondary | ICD-10-CM | POA: Diagnosis not present

## 2024-11-15 DIAGNOSIS — I4719 Other supraventricular tachycardia: Secondary | ICD-10-CM | POA: Diagnosis not present

## 2024-11-15 DIAGNOSIS — F1410 Cocaine abuse, uncomplicated: Secondary | ICD-10-CM | POA: Diagnosis not present

## 2024-11-15 DIAGNOSIS — I5023 Acute on chronic systolic (congestive) heart failure: Secondary | ICD-10-CM | POA: Diagnosis not present

## 2024-11-15 DIAGNOSIS — I110 Hypertensive heart disease with heart failure: Secondary | ICD-10-CM | POA: Diagnosis not present

## 2024-11-15 LAB — HEPARIN LEVEL (UNFRACTIONATED)
Heparin Unfractionated: 0.25 [IU]/mL — ABNORMAL LOW (ref 0.30–0.70)
Heparin Unfractionated: 0.28 [IU]/mL — ABNORMAL LOW (ref 0.30–0.70)
Heparin Unfractionated: 0.38 [IU]/mL (ref 0.30–0.70)

## 2024-11-15 LAB — RAPID URINE DRUG SCREEN, HOSP PERFORMED
Amphetamines: POSITIVE — AB
Barbiturates: NOT DETECTED
Benzodiazepines: NOT DETECTED
Cocaine: POSITIVE — AB
Opiates: POSITIVE — AB
Tetrahydrocannabinol: POSITIVE — AB

## 2024-11-15 LAB — COMPREHENSIVE METABOLIC PANEL WITH GFR
ALT: 20 U/L (ref 0–44)
AST: 31 U/L (ref 15–41)
Albumin: 3 g/dL — ABNORMAL LOW (ref 3.5–5.0)
Alkaline Phosphatase: 56 U/L (ref 38–126)
Anion gap: 11 (ref 5–15)
BUN: 19 mg/dL (ref 6–20)
CO2: 25 mmol/L (ref 22–32)
Calcium: 8 mg/dL — ABNORMAL LOW (ref 8.9–10.3)
Chloride: 99 mmol/L (ref 98–111)
Creatinine, Ser: 1.25 mg/dL — ABNORMAL HIGH (ref 0.61–1.24)
GFR, Estimated: >60 mL/min (ref >60)
Glucose, Bld: 152 mg/dL — ABNORMAL HIGH (ref 70–99)
Potassium: 3.1 mmol/L — ABNORMAL LOW (ref 3.5–5.1)
Sodium: 135 mmol/L (ref 135–145)
Total Bilirubin: 0.6 mg/dL (ref 0.0–1.2)
Total Protein: 5.5 g/dL — ABNORMAL LOW (ref 6.5–8.1)

## 2024-11-15 LAB — CBC
HCT: 46.6 % (ref 39.0–52.0)
Hemoglobin: 16.5 g/dL (ref 13.0–17.0)
MCH: 34.1 pg — ABNORMAL HIGH (ref 26.0–34.0)
MCHC: 35.4 g/dL (ref 30.0–36.0)
MCV: 96.3 fL (ref 80.0–100.0)
Platelets: 167 K/uL (ref 150–400)
RBC: 4.84 MIL/uL (ref 4.22–5.81)
RDW: 11.9 % (ref 11.5–15.5)
WBC: 4.8 K/uL (ref 4.0–10.5)
nRBC: 0 % (ref 0.0–0.2)

## 2024-11-15 LAB — TROPONIN I (HIGH SENSITIVITY)
Troponin I (High Sensitivity): 4056 ng/L (ref <18)
Troponin I (High Sensitivity): 4046 ng/L (ref <18)
Troponin I (High Sensitivity): 3107 ng/L (ref <18)

## 2024-11-15 LAB — MRSA NEXT GEN BY PCR, NASAL: MRSA by PCR Next Gen: NOT DETECTED

## 2024-11-15 LAB — MAGNESIUM: Magnesium: 1.7 mg/dL (ref 1.7–2.4)

## 2024-11-15 MED ORDER — OXYCODONE HCL 5 MG PO TABS
5.0000 mg | ORAL_TABLET | Freq: Four times a day (QID) | ORAL | Status: DC | PRN
  Administered 2024-11-15 – 2024-11-17 (×6): 5 mg via ORAL
  Filled 2024-11-15 (×6): qty 1

## 2024-11-15 MED ORDER — MAGNESIUM SULFATE 2 GM/50ML IV SOLN
2.0000 g | Freq: Once | INTRAVENOUS | Status: AC
  Administered 2024-11-15: 2 g via INTRAVENOUS
  Filled 2024-11-15: qty 50

## 2024-11-15 MED ORDER — AMIODARONE HCL IN DEXTROSE 360-4.14 MG/200ML-% IV SOLN
60.0000 mg/h | INTRAVENOUS | Status: AC
  Administered 2024-11-15 (×2): 60 mg/h via INTRAVENOUS
  Filled 2024-11-15: qty 200

## 2024-11-15 MED ORDER — POLYETHYLENE GLYCOL 3350 17 G PO PACK
17.0000 g | PACK | Freq: Every day | ORAL | Status: DC
  Administered 2024-11-15: 17 g via ORAL
  Filled 2024-11-15 (×3): qty 1

## 2024-11-15 MED ORDER — POTASSIUM CHLORIDE 10 MEQ/100ML IV SOLN: 10.0000 meq | INTRAVENOUS | Status: DC

## 2024-11-15 MED ORDER — POTASSIUM CHLORIDE CRYS ER 20 MEQ PO TBCR
40.0000 meq | EXTENDED_RELEASE_TABLET | Freq: Two times a day (BID) | ORAL | Status: AC
  Administered 2024-11-15 (×2): 40 meq via ORAL
  Filled 2024-11-15 (×2): qty 2

## 2024-11-15 MED ORDER — SENNOSIDES-DOCUSATE SODIUM 8.6-50 MG PO TABS
2.0000 | ORAL_TABLET | Freq: Two times a day (BID) | ORAL | Status: DC
  Administered 2024-11-15 – 2024-11-16 (×3): 2 via ORAL
  Filled 2024-11-15 (×5): qty 2

## 2024-11-15 MED ORDER — AMIODARONE HCL IN DEXTROSE 360-4.14 MG/200ML-% IV SOLN
30.0000 mg/h | INTRAVENOUS | Status: DC
  Administered 2024-11-16: 30 mg/h via INTRAVENOUS
  Filled 2024-11-15 (×2): qty 200

## 2024-11-15 NOTE — Progress Notes (Signed)
 PHARMACY - ANTICOAGULATION CONSULT NOTE  Pharmacy Consult for heparin  gtt Indication: chest pain/ACS  No Known Allergies  Patient Measurements: Height: 6' 1 (185.4 cm) Weight: 90.1 kg (198 lb 10.2 oz) IBW/kg (Calculated) : 79.9 HEPARIN  DW (KG): 90.1  Vital Signs: Temp: 98.1 F (36.7 C) (11/28 1603) Temp Source: Oral (11/28 1603) BP: 122/88 (11/28 1728) Pulse Rate: 70 (11/28 1728)  Labs: Recent Labs    11/14/24 1416 11/14/24 1649 11/14/24 2320 11/15/24 0108 11/15/24 0344 11/15/24 1105 11/15/24 1819  HGB 17.5*  --   --  16.5  --   --   --   HCT 50.7  --   --  46.6  --   --   --   PLT 154  --   --  167  --   --   --   HEPARINUNFRC  --   --   --  0.25*  --  0.28* 0.38  CREATININE 1.38*  --   --  1.25*  --   --   --   TROPONINIHS 83*   < > 4,056* 4,046* 3,107*  --   --    < > = values in this interval not displayed.    Estimated Creatinine Clearance: 72.8 mL/min (A) (by C-G formula based on SCr of 1.25 mg/dL (H)).   Assessment: 58 yo M with chest pain. Pt also history of afib s/p ablation. No anticoagulation PTA.  Pharmacy consulted for heparn gtt in setting of ACS event.  Trop 83 > 4056 > 3107  HL is sub-therapeutic at 0.28. CBC stable No issues with infusion or bleeding reported, per RN.  11/28 PM update - heparin  level 0.38 therapeutic after rate increase to 1500 units/hr.  Troponins pk 4056 overnight, downtrending to 3107 in the setting of SVT (cTn elevations similar to 11/2 NSTEMI presentation).  No mention of chest pain today.  Goal of Therapy:  Heparin  level 0.3-0.7 units/ml Monitor platelets by anticoagulation protocol: Yes   Plan:  Continue heparin  infusion 1500 units/hr Daily heparin  level, CBC Continue to monitor H&H and platelets F/u cards recs, plans for relook vs med management   Thank you for allowing pharmacy to be a part of this patient's care.   Maurilio Fila, PharmD Clinical Pharmacist 11/15/2024  7:03 PM

## 2024-11-15 NOTE — Assessment & Plan Note (Signed)
 Hx of alcohol use;1 pint every 2 days. Last drink 11/25. Required Librium  taper during on last admission.  - Continue CIWA w/o Ativan  for now - Continue Multivitamin, folic acid , thiamine  - TOC consult for substance use

## 2024-11-15 NOTE — Assessment & Plan Note (Signed)
 Cr of 1.38 on admission; baseline around 1.2 (based on reading from 03/2024). - AM BMP, Mg - Continue to monitor

## 2024-11-15 NOTE — TOC Initial Note (Signed)
 Transition of Care [facility]) - Initial/Assessment Note    Patient Details  Name: Bobby Horn MRN: [id number] Date of Birth: 1966/01/12  Transition of Care [facility]) CM/SW Contact:    Andrez JULIANNA George, RN Phone Number: 11/15/2024, 12:54 PM  Clinical Narrative:                 Bobby Horn is a 58 y.o. 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  Pt is from home alone. He states his baby mommas will check on him and assist as needed after d/c.  No DME. Pt manages his own medications.  Pt drives self but has assistance with transportation if needed.  Pt is active with [facility] for PCP.   IP Care management following.  Expected Discharge Plan: Home/Self Care Barriers to Discharge: Continued Medical Work up   Patient Goals and CMS Choice            Expected Discharge Plan and Services       Living arrangements for the past 2 months: Single Family Home                                      Prior Living Arrangements/Services Living arrangements for the past 2 months: Single Family Home   Patient language and need for interpreter reviewed:: Yes Do you feel safe going back to the place where you live?: Yes            Criminal Activity/Legal Involvement Pertinent to Current Situation/Hospitalization: No - Comment as needed  Activities of Daily Living   ADL Screening (condition at time of admission) Independently performs ADLs?: Yes (appropriate for developmental age) Is the patient deaf or have difficulty hearing?: No Does the patient have difficulty seeing, even when wearing glasses/contacts?: No Does the patient have difficulty concentrating, remembering, or making decisions?: No  Permission Sought/Granted                  Emotional Assessment Appearance:: Appears stated age Attitude/Demeanor/Rapport: Engaged Affect (typically observed):  Accepting Orientation: : Oriented to Self, Oriented to Place, Oriented to  Time, Oriented to Situation   Psych Involvement: No (comment)  Admission diagnosis:  NSTEMI (non-ST elevated myocardial infarction) [facility]) [I21.4] Patient Active Problem List   Diagnosis Date Noted   Alcohol use 11/14/2024   Cocaine use 11/14/2024   Elevated Serum creatinine 11/14/2024   V tach (HCC) 11/14/2024   Acute systolic heart failure (HCC) 10/21/2024   Chronic health problem 10/20/2024   Polycythemia 10/20/2024   S/P lumbar fusion 06/10/2015   Fracture of thumb 06/04/2015   Lumbar vertebral fracture (HCC) 06/03/2015   Fall 06/03/2015   Chest pain, musculoskeletal 11/06/2011   NSTEMI (non-ST elevated myocardial infarction) (HCC) 11/04/2011   Hypertension 11/04/2011   Chronic pain syndrome    Supraventricular tachycardia    Alcohol abuse    Tobacco abuse    Gout    Nephrolithiasis 04/19/2011   Alcohol abuse 06/17/2009   PCP:  Pcp, No Pharmacy:   [id number] [place], [place] - [address] AT [facility] OF [address] [place]  [address] Phone: [phone] Fax: [phone]  [facility] [address] [place] [address] Phone: [phone] Fax: [phone]  Social Drivers of Health (SDOH) Social History: SDOH Screenings   Food Insecurity: Food Insecurity Present (11/14/2024)  Housing: Low Risk  (11/14/2024)  Recent Concern: Housing - High Risk (10/20/2024)  Transportation Needs: No Transportation Needs (11/14/2024)  Recent Concern: Transportation Needs - Unmet Transportation Needs (10/20/2024)  Utilities: At Risk (11/14/2024)  Alcohol Screen: Low Risk  (10/21/2024)  Financial Resource Strain: Low Risk  (10/21/2024)  Tobacco Use: High Risk (11/14/2024)   SDOH Interventions:     Readmission Risk Interventions     No data to display

## 2024-11-15 NOTE — Assessment & Plan Note (Signed)
 Reports last use ~11/24. - TOC consult for substance use

## 2024-11-15 NOTE — Progress Notes (Signed)
 PHARMACY - ANTICOAGULATION CONSULT NOTE  Pharmacy Consult for heparin  gtt Indication: chest pain/ACS  No Known Allergies  Patient Measurements: Height: 6' 1 (185.4 cm) Weight: 90.1 kg (198 lb 10.2 oz) IBW/kg (Calculated) : 79.9 HEPARIN  DW (KG): 90.1  Vital Signs: Temp: 97.6 F (36.4 C) (11/28 0743) Temp Source: Oral (11/28 0743) BP: 119/84 (11/28 0853) Pulse Rate: 74 (11/28 0853)  Labs: Recent Labs    11/14/24 1416 11/14/24 1649 11/14/24 2320 11/15/24 0108 11/15/24 0344 11/15/24 1105  HGB 17.5*  --   --  16.5  --   --   HCT 50.7  --   --  46.6  --   --   PLT 154  --   --  167  --   --   HEPARINUNFRC  --   --   --  0.25*  --  0.28*  CREATININE 1.38*  --   --  1.25*  --   --   TROPONINIHS 83*   < > 4,056* 4,046* 3,107*  --    < > = values in this interval not displayed.    Estimated Creatinine Clearance: 72.8 mL/min (A) (by C-G formula based on SCr of 1.25 mg/dL (H)).   Assessment: 58 yo M with chest pain. Pt also history of afib s/p ablation. No anticoagulation PTA.  Pharmacy consulted for heparn gtt in setting of ACS event.  Trop 83 > 4056 > 3107  HL is sub-therapeutic at 0.28. CBC stable No issues with infusion or bleeding reported, per RN.  Goal of Therapy:  Heparin  level 0.3-0.7 units/ml Monitor platelets by anticoagulation protocol: Yes   Plan:  Increase heparin  infusion to 1500 units/hr Check anti-Xa level in 6 hours and daily while on heparin  Continue to monitor H&H and platelets F/u long term AC plan   Thank you for allowing pharmacy to be a part of this patient's care.   Bascom JAYSON Louder, PharmD 11/15/2024 11:40 AM  **Pharmacist phone directory can be found on [email or web address] listed under [facility]**

## 2024-11-15 NOTE — Assessment & Plan Note (Signed)
 EF of <30% via echo done 11/2. - Cards consulted, pending recommendations - GDMT: Continue home coreg  3.125 mg BID                Hold home Lasix  20 mg daily and losartan  12.5 mg daily

## 2024-11-15 NOTE — Assessment & Plan Note (Addendum)
 Received ASA 324 mg w/ Amiodarone  IV Bolus once since admission - Vital signs per floor - Cardiology consulted  - Suspect recurrence of his left posterolateral pathway    - Continue IV amiodarone  for an additional 24 hours. Can likely transition to oral tomorrow.    - If recurrent despite treatment with amiodarone  favor EP study and ablation.  - Continue Heparin  gtt and Amiodarone  gtt  - Continue home ASA 81 mg daily, Plavix  75 mg daily - Pain: IV morphine  2mg  PRN - Labs: AM CMP, CBC - trend trop - UDS pending - PT/OT consult  - Please notify primary team if patient becomes hemodynamically unstable or change in EKG for possible cath

## 2024-11-15 NOTE — TOC CAGE-AID Note (Signed)
 Transition of Care [facility]) - CAGE-AID Screening   Patient Details  Name: Bobby Horn MRN: [id number] Date of Birth: 1966-07-18  Transition of Care [facility]) CM/SW Contact:    Andrez JULIANNA George, RN Phone Number: 11/15/2024, 12:59 PM   Clinical Narrative: Pt denied the need for inpatient/ outpatient alcohol and drug counseling resources.    CAGE-AID Screening:    Have You Ever Felt You Ought to Cut Down on Your Drinking or Drug Use?: No Have People Annoyed You By Critizing Your Drinking Or Drug Use?: No Have You Felt Bad Or Guilty About Your Drinking Or Drug Use?: No Have You Ever Had a Drink or Used Drugs First Thing In The Morning to Steady Your Nerves or to Get Rid of a Hangover?: No CAGE-AID Score: 0  Substance Abuse Education Offered: Yes (pt refused)

## 2024-11-15 NOTE — Progress Notes (Signed)
  Patient Name: Bobby Horn Date of Encounter: 11/15/2024  Primary Cardiologist: None Electrophysiologist: None  Interval Summary   NAEO  Vital Signs    Vitals:   11/14/24 2104 11/14/24 2301 11/15/24 0444 11/15/24 0743  BP: (!) 117/91 112/83 119/79 119/84  Pulse: 74 89 82 72  Resp: 20 19 18 17   Temp: 98 F (36.7 C) 97.9 F (36.6 C) 97.9 F (36.6 C) 97.6 F (36.4 C)  TempSrc: Oral Oral Oral Oral  SpO2: 99% 95% 96% 96%  Weight: 90.1 kg     Height: 6' 1 (1.854 m)       Intake/Output Summary (Last 24 hours) at 11/15/2024 0807 Last data filed at 11/15/2024 0446 Gross per 24 hour  Intake 207.33 ml  Output 225 ml  Net -17.67 ml   Filed Weights   11/14/24 1413 11/14/24 2104  Weight: 98.4 kg 90.1 kg    Physical Exam    GEN- NAD, Alert and oriented  Lungs- Clear to ausculation bilaterally, normal work of breathing Cardiac- Regular rate and rhythm, no murmurs, rubs or gallops GI- soft, NT, ND, + BS Extremities- no clubbing or cyanosis. No edema  Telemetry    NSR 70-80s, no recurrent arrhythmia since ~ midnight (personally reviewed)  Hospital Course    Bobby Horn is a 58 y.o. male admitted for [facility]. History includes CAD, SVT, Alcohol abuse, substance abuse, and NSTEMI s/p cath 10/20/2024  Assessment & Plan    Wide-complex tachycardia Suspect AVRT but cannot rule out other mechanisms of SVT.  Continue IV amiodarone  today.  If remains stable could discharge for outpatient follow up with EP to discuss ablation.  If has recurrent arrhythmia over weekend, could potentially plan EPS +/- ablation next week.    Chest pain NSTEMI Continue heparin  for 24 to 48 hours HS trop 1427 -> 3991 -> 4056 (peak) Cath 10/20/2024 with 2nd Sept lesion is 99% stenosed. Prox Cx to Mid Cx lesion is 10% stenosed. Gen cards to see for recommendations Continue aspirin    Alcohol abuse history Management per primary team  Dr. Cindie has seen.   For questions or updates, please  contact [facility] Please consult [email or web address] for contact info under     Signed, Ozell Prentice Passey, PA-C  11/15/2024, 8:07 AM

## 2024-11-15 NOTE — Progress Notes (Addendum)
 Daily Progress Note Intern Pager: [phone]  Patient name: Bobby Horn Medical record number: [id number] Date of birth: 03-Oct-1966 Age: 58 y.o. Gender: male  Primary Care Provider: Pcp, No Consultants: Cardiology  Code Status: Full Code  Pt Overview and Major Events to Date:  11/14/24 : Admitted for Chest pain   Bobby Horn is a 58 y.o. male with PMH of A fib s/p ablation, NSTEMI (10/2024), CHF, SVT, alcohol use, tobacco use, OUD on suboxone , HTN presenting with chest pain, elevated troponin, w/ change in EKG likely STEMI Assessment & Plan NSTEMI (non-ST elevated myocardial infarction) (HCC) V tach (HCC) Received ASA 324 mg w/ Amiodarone  IV Bolus once since admission - Vital signs per floor - Cardiology consulted  - Suspect recurrence of his left posterolateral pathway    - Continue IV amiodarone  for an additional 24 hours. Can likely transition to oral tomorrow.    - If recurrent despite treatment with amiodarone  favor EP study and ablation.  - Continue Heparin  gtt and Amiodarone  gtt  - Continue home ASA 81 mg daily, Plavix  75 mg daily - Pain: IV morphine  2mg  PRN - Labs: AM CMP, CBC - trend trop - UDS pending - PT/OT consult  - Please notify primary team if patient becomes hemodynamically unstable or change in EKG for possible cath Acute systolic heart failure (HCC) EF of <30% via echo done 11/2. - Cards consulted, pending recommendations - GDMT: Continue home coreg  3.125 mg BID                Hold home Lasix  20 mg daily and losartan  12.5 mg daily Elevated Serum creatinine Cr of 1.38 on admission; baseline around 1.2 (based on reading from 03/2024). - AM BMP, Mg - Continue to monitor Alcohol use Hx of alcohol use;1 pint every 2 days. Last drink 11/25. Required Librium  taper during on last admission.  - Continue CIWA w/o Ativan  for now - Continue Multivitamin, folic acid , thiamine  - TOC consult for substance use Cocaine use Reports last use ~11/24. - TOC consult for  substance use Chronic health problem HTN: Continue home coreg  3.125 mg BID, Hold Losartan  12.5 mg daily, lasix  20 mg daily HLD: Holding home Lipitor  80 mg daily Chronic pain: Pain medication as above Opioid Use Disorder: Holding home suboxone  TID while NPO  -- pt reports he has not taken this in several months Sleep: Holding home Ambien  10 mg at bedtime   FEN/GI: Heart Healthy  PPx: Heparin  gtt Dispo:Pending Clinical improvement    Subjective:  C/o 10/10 chest pain. Otherwise hemodynamically stable. SVT w/ some PVC since admission. No other complaint beside pain  Objective: Temp:  [95.5 F (35.3 C)-98.2 F (36.8 C)] 97.6 F (36.4 C) (11/28 0743) Pulse Rate:  [62-91] 72 (11/28 0743) Resp:  [12-32] 17 (11/28 0743) BP: (112-137)/(75-102) 119/84 (11/28 0743) SpO2:  [93 %-100 %] 96 % (11/28 0743) Weight:  [90.1 kg-98.4 kg] 90.1 kg (11/27 2104) Physical Exam: General: Non Toxic. In pain  Cardiovascular: RRR, S1S2  Respiratory: No distress on RA Abdomen: Soft, Non tender Last BM 11/14/24 Extremities: 2+ Bilateral Upper and Lower extremities strength   Laboratory: Most recent CBC Lab Results  Component Value Date   WBC 4.8 11/15/2024   HGB 16.5 11/15/2024   HCT 46.6 11/15/2024   MCV 96.3 11/15/2024   PLT 167 11/15/2024   Most recent BMP    Latest Ref Rng & Units 11/15/2024    1:08 AM  BMP  Glucose 70 - 99 mg/dL 847  BUN 6 - 20 mg/dL 19   Creatinine 9.38 - 1.24 mg/dL 8.74   Sodium 864 - 854 mmol/L 135   Potassium 3.5 - 5.1 mmol/L 3.1   Chloride 98 - 111 mmol/L 99   CO2 22 - 32 mmol/L 25   Calcium  8.9 - 10.3 mg/dL 8.0      Imaging/Diagnostic Tests: EXAM: CHEST - 2 VIEW  IMPRESSION: No active cardiopulmonary disease.  Suzen Houston NOVAK, DO 11/15/2024, 8:02 AM  PGY-1, [facility] FPTS Intern pager: [phone], text pages welcome Secure chat group [facility] Teaching Service

## 2024-11-15 NOTE — Progress Notes (Signed)
 PHARMACY - ANTICOAGULATION CONSULT NOTE  Pharmacy Consult for heparin  Indication: chest pain/ACS  No Known Allergies  Patient Measurements: Height: 6' 1 (185.4 cm) Weight: 90.1 kg (198 lb 10.2 oz) IBW/kg (Calculated) : 79.9 HEPARIN  DW (KG): 90.1  Vital Signs: Temp: 97.9 F (36.6 C) (11/27 2301) Temp Source: Oral (11/27 2301) BP: 112/83 (11/27 2301) Pulse Rate: 89 (11/27 2301)  Labs: Recent Labs    11/14/24 1416 11/14/24 1649 11/14/24 2153 11/14/24 2320 11/15/24 0108  HGB 17.5*  --   --   --  16.5  HCT 50.7  --   --   --  46.6  PLT 154  --   --   --  167  HEPARINUNFRC  --   --   --   --  0.25*  CREATININE 1.38*  --   --   --  1.25*  TROPONINIHS 83*   < > 3,991* 4,056* 4,046*   < > = values in this interval not displayed.    Estimated Creatinine Clearance: 72.8 mL/min (A) (by C-G formula based on SCr of 1.25 mg/dL (H)).   Assessment: 58 yo M with chest pain. Pt also history of afib s/p ablation. No anticoagulation PTA.  Pharmacy consulted for heparn gtt in setting of ACS event.   Hgb 17.5, Plt 154 Trop 83>1427  11/28 AM update:  Heparin  level sub-therapeutic   Goal of Therapy:  Heparin  level 0.3-0.7 units/ml Monitor platelets by anticoagulation protocol: Yes   Plan:  Inc heparin  to 1350 units/hr 8hr HL Daily HL, CBC F/u s/sx bleeding and LOT  Lynwood Mckusick, PharmD, BCPS Clinical Pharmacist Phone: [phone]

## 2024-11-15 NOTE — Assessment & Plan Note (Signed)
 HTN: Continue home coreg  3.125 mg BID, Hold Losartan  12.5 mg daily, lasix  20 mg daily HLD: Holding home Lipitor  80 mg daily Chronic pain: Pain medication as above Opioid Use Disorder: Holding home suboxone  TID while NPO  -- pt reports he has not taken this in several months Sleep: Holding home Ambien  10 mg at bedtime

## 2024-11-15 NOTE — Plan of Care (Addendum)
 FMTS Interim Progress Note  RN message that pt sustaining HR in 160s for a few mins  He came out of it before EKG was able to capture Reviewed tele, appears to be SVT narrow complex tachycardia rather than the wide complex he was having earlier Visited pt at bedside after episode, denies concerns  O: BP 112/83 (BP Location: Left Arm)   Pulse 89   Temp 97.9 F (36.6 C) (Oral)   Resp 19   Ht 6' 1 (1.854 m)   Wt 90.1 kg   SpO2 95%   BMI 26.21 kg/m    NAD, awake, alert RRR CTAB  A/P:  Tachycardia S/p amio bolus 150mg  IV in ED Discussed with cardiology fellow on-call to update given sustained arrhythmia Also discussed rising troponin, no acute intervention needed for this right now Ideally need to focus on rate/rhythm control  Plan: If patient sustains again >83min, plan to start amio gtt Discussed with RN Continue heparin  gtt  Romelle Booty, MD 11/15/2024, 12:21 AM PGY-3, [facility] Service pager [phone]

## 2024-11-15 NOTE — Plan of Care (Signed)
  Problem: Clinical Measurements: Goal: Will remain free from infection Outcome: Progressing   Problem: Activity: Goal: Risk for activity intolerance will decrease Outcome: Progressing   Problem: Nutrition: Goal: Adequate nutrition will be maintained Outcome: Progressing   Problem: Coping: Goal: Level of anxiety will decrease Outcome: Progressing   

## 2024-11-16 DIAGNOSIS — I4719 Other supraventricular tachycardia: Secondary | ICD-10-CM | POA: Diagnosis not present

## 2024-11-16 DIAGNOSIS — I5022 Chronic systolic (congestive) heart failure: Secondary | ICD-10-CM | POA: Diagnosis not present

## 2024-11-16 LAB — CBC WITH DIFFERENTIAL/PLATELET
Abs Immature Granulocytes: 0.01 K/uL (ref 0.00–0.07)
Basophils Absolute: 0 K/uL (ref 0.0–0.1)
Basophils Relative: 1 %
Eosinophils Absolute: 0.2 K/uL (ref 0.0–0.5)
Eosinophils Relative: 4 %
HCT: 44.9 % (ref 39.0–52.0)
Hemoglobin: 15.8 g/dL (ref 13.0–17.0)
Immature Granulocytes: 0 %
Lymphocytes Relative: 40 %
Lymphs Abs: 1.9 K/uL (ref 0.7–4.0)
MCH: 34.1 pg — ABNORMAL HIGH (ref 26.0–34.0)
MCHC: 35.2 g/dL (ref 30.0–36.0)
MCV: 97 fL (ref 80.0–100.0)
Monocytes Absolute: 0.3 K/uL (ref 0.1–1.0)
Monocytes Relative: 5 %
Neutro Abs: 2.4 K/uL (ref 1.7–7.7)
Neutrophils Relative %: 50 %
Platelets: 146 K/uL — ABNORMAL LOW (ref 150–400)
RBC: 4.63 MIL/uL (ref 4.22–5.81)
RDW: 11.9 % (ref 11.5–15.5)
WBC: 4.8 K/uL (ref 4.0–10.5)
nRBC: 0 % (ref 0.0–0.2)

## 2024-11-16 LAB — BASIC METABOLIC PANEL WITH GFR
Anion gap: 7 (ref 5–15)
BUN: 13 mg/dL (ref 6–20)
CO2: 23 mmol/L (ref 22–32)
Calcium: 8.1 mg/dL — ABNORMAL LOW (ref 8.9–10.3)
Chloride: 106 mmol/L (ref 98–111)
Creatinine, Ser: 1.25 mg/dL — ABNORMAL HIGH (ref 0.61–1.24)
GFR, Estimated: >60 mL/min (ref >60)
Glucose, Bld: 92 mg/dL (ref 70–99)
Potassium: 4 mmol/L (ref 3.5–5.1)
Sodium: 136 mmol/L (ref 135–145)

## 2024-11-16 LAB — MAGNESIUM: Magnesium: 1.8 mg/dL (ref 1.7–2.4)

## 2024-11-16 LAB — HEPARIN LEVEL (UNFRACTIONATED): Heparin Unfractionated: 0.63 [IU]/mL (ref 0.30–0.70)

## 2024-11-16 MED ORDER — HYDROXYZINE HCL 10 MG PO TABS
10.0000 mg | ORAL_TABLET | Freq: Once | ORAL | Status: AC
  Administered 2024-11-16: 10 mg via ORAL
  Filled 2024-11-16: qty 1

## 2024-11-16 MED ORDER — MAGNESIUM SULFATE 2 GM/50ML IV SOLN
2.0000 g | Freq: Once | INTRAVENOUS | Status: AC
  Administered 2024-11-16: 2 g via INTRAVENOUS
  Filled 2024-11-16: qty 50

## 2024-11-16 MED ORDER — LOSARTAN POTASSIUM 25 MG PO TABS
12.5000 mg | ORAL_TABLET | Freq: Every day | ORAL | Status: DC
  Administered 2024-11-17: 12.5 mg via ORAL
  Filled 2024-11-16: qty 1

## 2024-11-16 MED ORDER — ENOXAPARIN SODIUM 40 MG/0.4ML IJ SOSY
40.0000 mg | PREFILLED_SYRINGE | INTRAMUSCULAR | Status: DC
  Administered 2024-11-16: 40 mg via SUBCUTANEOUS
  Filled 2024-11-16: qty 0.4

## 2024-11-16 MED ORDER — FUROSEMIDE 20 MG PO TABS
20.0000 mg | ORAL_TABLET | Freq: Every day | ORAL | Status: DC
  Administered 2024-11-17: 20 mg via ORAL
  Filled 2024-11-16: qty 1

## 2024-11-16 MED ORDER — AMIODARONE HCL 200 MG PO TABS
400.0000 mg | ORAL_TABLET | Freq: Two times a day (BID) | ORAL | Status: DC
  Administered 2024-11-16 – 2024-11-17 (×3): 400 mg via ORAL
  Filled 2024-11-16 (×3): qty 2

## 2024-11-16 NOTE — Assessment & Plan Note (Signed)
 HTN: Continue home coreg  3.125 mg BID, Hold Losartan  12.5 mg daily, lasix  20 mg daily HLD: Holding home Lipitor  80 mg daily Chronic pain: Pain medication as above Opioid Use Disorder: Holding home suboxone  TID while NPO  -- pt reports he has not taken this in several months Sleep: Holding home Ambien  10 mg at bedtime

## 2024-11-16 NOTE — Assessment & Plan Note (Addendum)
 Suspect recurrence of his left posterolateral pathway. S//p 24 hours IV amio today, on oral amio. Watch for 24 houyrs,  if no recurrence anticipate discharge - Cardiology consulted appreciate recommendations - Amiodarone  400 mg BID, monitor for 24 hours on oral amiodarone  - Continue amiodarone  400 mg BID for 14 days then 200 mg every day thereafter. - Continue Heparin  gtt and Amiodarone  gtt  - Continue home ASA 81 mg daily, Plavix  75 mg daily - Pain: IV morphine  2mg  PRN - Labs: AM CMP, Mag - trend trop - UDS pending - PT/OT consult

## 2024-11-16 NOTE — Assessment & Plan Note (Signed)
 EF of <30% via echo done 11/2. - Cards consulted, pending recommendations - GDMT: Continue home coreg  3.125 mg BID                Hold home Lasix  20 mg daily and losartan  12.5 mg daily

## 2024-11-16 NOTE — Assessment & Plan Note (Signed)
 Reports last use ~11/24. - TOC consult for substance use

## 2024-11-16 NOTE — Plan of Care (Signed)

## 2024-11-16 NOTE — Assessment & Plan Note (Deleted)
 Cr of 1.38 on admission; baseline around 1.2 (based on reading from 03/2024). - AM BMP, Mg - Continue to monitor

## 2024-11-16 NOTE — Progress Notes (Signed)
 PHARMACY - ANTICOAGULATION CONSULT NOTE  Pharmacy Consult for heparin  gtt Indication: chest pain/ACS  No Known Allergies  Patient Measurements: Height: 6' 1 (185.4 cm) Weight: 90.1 kg (198 lb 10.2 oz) IBW/kg (Calculated) : 79.9 HEPARIN  DW (KG): 90.1  Vital Signs: Temp: 98.3 F (36.8 C) (11/29 1137) Temp Source: Oral (11/29 1137) BP: 125/92 (11/29 1137) Pulse Rate: 65 (11/29 1137)  Labs: Recent Labs    11/14/24 1416 11/14/24 1416 11/14/24 1649 11/14/24 2320 11/15/24 0108 11/15/24 0344 11/15/24 1105 11/15/24 1819 11/16/24 0318  HGB 17.5*  --   --   --  16.5  --   --   --  15.8  HCT 50.7  --   --   --  46.6  --   --   --  44.9  PLT 154  --   --   --  167  --   --   --  146*  HEPARINUNFRC  --    < >  --   --  0.25*  --  0.28* 0.38 0.63  CREATININE 1.38*  --   --   --  1.25*  --   --   --  1.25*  TROPONINIHS 83*  --    < > 4,056* 4,046* 3,107*  --   --   --    < > = values in this interval not displayed.    Estimated Creatinine Clearance: 72.8 mL/min (A) (by C-G formula based on SCr of 1.25 mg/dL (H)).   Assessment: 58 yo M with chest pain. Pt also history of afib s/p ablation. No anticoagulation PTA.  Pharmacy consulted for heparn gtt in setting of ACS event.  Trop 83 > 4056 > 3107  Heparin  level came back therapeutic at 0.63, on 1500 units/hr. Hgb 15.8, plt 146. No s/sx of bleeding or infusion issues.   Goal of Therapy:  Heparin  level 0.3-0.7 units/ml Monitor platelets by anticoagulation protocol: Yes   Plan:  Continue heparin  infusion 1500 units/hr Daily heparin  level, CBC Continue to monitor H&H and platelets Plan for 48 hours total of heparin  - ends today at ~1800  Thank you for allowing pharmacy to participate in this patient's care,  Suzen Sour, PharmD, BCCCP Clinical Pharmacist  Phone: [phone] 11/16/2024 12:42 PM  Please check AMION for all [facility] Pharm[facility] phone numbers After 10:00 PM, call Main Pharmacy [phone]

## 2024-11-16 NOTE — Assessment & Plan Note (Addendum)
 Hx of alcohol use;1 pint every 2 days. Last drink 11/25. Required Librium  taper during on last admission. Last CIWA 2 (anxiety and agitation, no tremor or AVH) - Continue CIWA w/o Ativan  for now - Continue Multivitamin, folic acid , thiamine  - TOC consult for substance use

## 2024-11-16 NOTE — Progress Notes (Signed)
 Progress Note  Patient Name: Bobby Horn Date of Encounter: 11/16/2024 [facility] Cardiologist: None   Interval Summary   No acute overnight events. Patient reports feeling relatively well. No new or acute complaints.   Vital Signs Vitals:   11/15/24 2337 11/16/24 0327 11/16/24 0606 11/16/24 0722  BP: (!) 131/99 (!) 130/102 (!) 125/96 124/85  Pulse: 67 73 75 65  Resp: 18 19  16   Temp: 99 F (37.2 C) 98.1 F (36.7 C)  98.1 F (36.7 C)  TempSrc: Oral Oral  Oral  SpO2: 95% 93%  97%  Weight:      Height:        Intake/Output Summary (Last 24 hours) at 11/16/2024 0927 Last data filed at 11/16/2024 0723 Gross per 24 hour  Intake 887.04 ml  Output 1725 ml  Net -837.96 ml      11/14/2024    9:04 PM 11/14/2024    2:13 PM 10/22/2024    5:47 AM  Last 3 Weights  Weight (lbs) 198 lb 10.2 oz 217 lb 210 lb 1.6 oz  Weight (kg) 90.1 kg 98.431 kg 95.3 kg      Telemetry/ECG  No further episodes of SVT or wide complex tach - Personally Reviewed  Physical Exam  General: Well developed, in no acute distress.  Neck: No JVD.  Cardiac: Normal rate, regular rhythm.  Resp: Normal work of breathing.  Ext: No edema.  Neuro: No gross focal deficits.  Psych: Normal affect.   Echo 10/20/24:  1. Left ventricular ejection fraction, by estimation, is 20 to 25%. Left  ventricular ejection fraction by 3D volume is 23 %. The left ventricle has  severely decreased function. The left ventricle has no regional wall  motion abnormalities. Indeterminate  diastolic filling due to E-A fusion. The average left ventricular global  longitudinal strain is -5.6 %. The global longitudinal strain is abnormal.   2. Right ventricular systolic function is moderately reduced. The right  ventricular size is normal. There is normal pulmonary artery systolic  pressure.   3. The mitral valve is normal in structure. Mild mitral valve  regurgitation. No evidence of mitral stenosis.   4. The aortic  valve has an indeterminant number of cusps. Aortic valve  regurgitation is not visualized. No aortic stenosis is present.   5. The inferior vena cava is normal in size with greater than 50%  respiratory variability, suggesting right atrial pressure of 3 mmHg.   LHC 10/20/24:   2nd Sept lesion is 99% stenosed.   Prox Cx to Mid Cx lesion is 10% stenosed.   1.  Mild nonobstructive disease of major epicardial arteries.  Of note there is a 99% lesion of the second small septal branch.  This is associated with TIMI-3 flow. 2.  Ventriculography demonstrating global hypokinesis with with an ejection fraction of approximately 20 to 25% with no evidence of Takotsubo cardiomyopathy.  The LVEDP was 17 mmHg.    Assessment & Plan  #Wide-complex tachycardia: Suspect recurrence of his left posterolateral pathway.   -No other AAD therapy options given his low LVEF.  If recurrent despite treatment with amiodarone  favor EP study and ablation.  This has been discussed with the patient who is in agreement.  Risks of Amio have also been discussed with the patient.  Not an ideal solution for him long-term but given his low LVEF and substance abuse history it is the most reasonable option at present. -Transition IV amiodarone  to amiodarone  400mg  twice daily. Continue twice daily  for 14 days then 200mg  once daily thereafter. Will monitor on oral amiodarone  for 24 hours and if no recurrence then anticipate discharge.   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 Management of SVT as above.   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 Probably substance abuse related. -Resume home carvedilol  3.125mg  BID, losartan  12.5mg  daily, lasix  20mg  daily.   #Cocaine, amphetamines, marijuana, and alcohol abuse history -Management per primary.  Signed, Fonda Kitty, MD

## 2024-11-16 NOTE — Progress Notes (Signed)
 Patient has also not been motivated to mobilize and has refused getting up today. Patient educated on importance of mobility.

## 2024-11-16 NOTE — Plan of Care (Signed)
  Problem: Education: Goal: Knowledge of General Education information will improve Description: Including pain rating scale, medication(s)/side effects and non-pharmacologic comfort measures Outcome: Progressing   Problem: Health Behavior/Discharge Planning: Goal: Ability to manage health-related needs will improve Outcome: Progressing   Problem: Pain Managment: Goal: General experience of comfort will improve and/or be controlled Outcome: Progressing   Problem: Health Behavior/Discharge Planning: Goal: Ability to safely manage health-related needs after discharge will improve Outcome: Progressing

## 2024-11-16 NOTE — Progress Notes (Signed)
 Daily Progress Note Intern Pager: [phone]  Patient name: Bobby Horn Medical record number: [id number] Date of birth: 11-11-1966 Age: 58 y.o. Gender: male  Primary Care Provider: Pcp, No Consultants: Cardiology Code Status: Full Code  Pt Overview and Major Events to Date:  11/27: Admitted for Chest Pain  Medical Decision Making: Mikale Silversmith third is a 58 year old male with a PMHx of A-fib s/p ablation, NSTEMI 10/2024, CHF, SVT, alcohol use, tobacco use, OUD on Suboxone , HTN presenting with chest pain and elevated troponins found to have NSTEMI in setting of SVT 2/2 severe stenosis of septal branch.   Completed 24 hours IV amio with no acute events, per cardio will transition to po amio and monitor an additional 24 hours. Possible discharge tomorrow Assessment & Plan NSTEMI (non-ST elevated myocardial infarction) (HCC) V tach (HCC) Suspect recurrence of his left posterolateral pathway. S//p 24 hours IV amio today, on oral amio. Watch for 24 houyrs,  if no recurrence anticipate discharge - Cardiology consulted appreciate recommendations - Amiodarone  400 mg BID, monitor for 24 hours on oral amiodarone  - Continue amiodarone  400 mg BID for 14 days then 200 mg every day thereafter. - Continue Heparin  gtt and Amiodarone  gtt  - Continue home ASA 81 mg daily, Plavix  75 mg daily - Pain: IV morphine  2mg  PRN - Labs: AM CMP, Mag - trend trop - UDS pending - PT/OT consult  Acute systolic heart failure (HCC) EF of <30% via echo done 11/2. - Cards consulted, pending recommendations - GDMT: Continue home coreg  3.125 mg BID - Hold home Lasix  20 mg daily and losartan  12.5 mg daily Alcohol use Hx of alcohol use;1 pint every 2 days. Last drink 11/25. Required Librium  taper during on last admission. Last CIWA 2 (anxiety and agitation, no tremor or AVH) - Continue CIWA w/o Ativan  for now - Continue Multivitamin, folic acid , thiamine  - TOC consult for substance use Cocaine use Reports last  use ~11/24. - TOC consult for substance use Chronic health problem HTN: Continue home coreg  3.125 mg BID, Hold Losartan  12.5 mg daily, lasix  20 mg daily HLD: Holding home Lipitor  80 mg daily Chronic pain: Pain medication as above Opioid Use Disorder: Holding home suboxone  TID while NPO  -- pt reports he has not taken this in several months Sleep: Holding home Ambien  10 mg at bedtime  FEN/GI: Heart  PPx: Heparin  gtt Dispo: Progressive  Subjective:  Found lying in bed with no complaints.  Asked about discharge, aware that cardiology will be seeing him today and making further recommendations.  Objective: Temp:  [97.6 F (36.4 C)-99.1 F (37.3 C)] 98.1 F (36.7 C) (11/29 0722) Pulse Rate:  [65-80] 65 (11/29 0722) Resp:  [16-19] 16 (11/29 0722) BP: (109-132)/(72-102) 124/85 (11/29 0722) SpO2:  [93 %-99 %] 97 % (11/29 0722)  Physical Exam: General: Well-appearing, no acute distress Cardio: Regular rate, regular rhythm, no murmurs on exam. Pulm: Clear, no wheezing, no crackles. No increased work of breathing Abdominal: Soft, non-tender, non-distended Extremities: no peripheral edema  Neuro: alert and oriented x3, speech normal in content Psych:  Alert, communicative  and cooperative with normal attention span and concentration. No apparent hallucinations  Laboratory: Most recent CBC Lab Results  Component Value Date   WBC 4.8 11/16/2024   HGB 15.8 11/16/2024   HCT 44.9 11/16/2024   MCV 97.0 11/16/2024   PLT 146 (L) 11/16/2024   Most recent BMP    Latest Ref Rng & Units 11/16/2024    3:18 AM  BMP  Glucose 70 - 99 mg/dL 92   BUN 6 - 20 mg/dL 13   Creatinine 9.38 - 1.24 mg/dL 8.74   Sodium 864 - 854 mmol/L 136   Potassium 3.5 - 5.1 mmol/L 4.0   Chloride 98 - 111 mmol/L 106   CO2 22 - 32 mmol/L 23   Calcium  8.9 - 10.3 mg/dL 8.1     Elodie Palma, MD 11/16/2024, 7:44 AM  PGY-1, [facility] FPTS Intern pager: [phone], text pages welcome Secure  chat group [facility] Teaching Service

## 2024-11-17 ENCOUNTER — Other Ambulatory Visit (HOSPITAL_COMMUNITY): Payer: Self-pay

## 2024-11-17 DIAGNOSIS — I5022 Chronic systolic (congestive) heart failure: Secondary | ICD-10-CM

## 2024-11-17 LAB — BASIC METABOLIC PANEL WITH GFR
Anion gap: 9 (ref 5–15)
BUN: 13 mg/dL (ref 6–20)
CO2: 23 mmol/L (ref 22–32)
Calcium: 8.2 mg/dL — ABNORMAL LOW (ref 8.9–10.3)
Chloride: 104 mmol/L (ref 98–111)
Creatinine, Ser: 1.29 mg/dL — ABNORMAL HIGH (ref 0.61–1.24)
GFR, Estimated: >60 mL/min (ref >60)
Glucose, Bld: 112 mg/dL — ABNORMAL HIGH (ref 70–99)
Potassium: 3.6 mmol/L (ref 3.5–5.1)
Sodium: 136 mmol/L (ref 135–145)

## 2024-11-17 LAB — MAGNESIUM: Magnesium: 1.8 mg/dL (ref 1.7–2.4)

## 2024-11-17 MED ORDER — IBUPROFEN 800 MG PO TABS: 800.0000 mg | ORAL_TABLET | Freq: Three times a day (TID) | ORAL | Status: AC | PRN

## 2024-11-17 MED ORDER — AMIODARONE HCL 200 MG PO TABS
ORAL_TABLET | ORAL | 0 refills | Status: AC
  Filled 2024-11-17: qty 82, 43d supply, fill #0

## 2024-11-17 MED ORDER — MAGNESIUM SULFATE IN D5W 1-5 GM/100ML-% IV SOLN
1.0000 g | Freq: Once | INTRAVENOUS | Status: AC
  Administered 2024-11-17: 1 g via INTRAVENOUS
  Filled 2024-11-17: qty 100

## 2024-11-17 MED ORDER — ADULT MULTIVITAMIN W/MINERALS CH: 1.0000 | ORAL_TABLET | Freq: Every day | ORAL | Status: AC

## 2024-11-17 NOTE — Progress Notes (Addendum)
  Progress Note  Patient Name: ALEKSANDER EDMISTON Date of Encounter: 11/17/2024 [facility] Cardiologist: None   Interval Summary   No acute overnight events. Patient reports feeling relatively well. No new or acute complaints.    Vital Signs Vitals:   11/16/24 2235 11/17/24 0344 11/17/24 0611 11/17/24 0815  BP: 114/61 123/80 123/80 116/79  Pulse: 61 69 64   Resp: 20 18  19   Temp: 98.3 F (36.8 C) 98.2 F (36.8 C)  98 F (36.7 C)  TempSrc: Oral Oral  Oral  SpO2: 100% 97%    Weight:      Height:        Intake/Output Summary (Last 24 hours) at 11/17/2024 0930 Last data filed at 11/17/2024 0818 Gross per 24 hour  Intake 484.36 ml  Output 1600 ml  Net -1115.64 ml      11/14/2024    9:04 PM 11/14/2024    2:13 PM 10/22/2024    5:47 AM  Last 3 Weights  Weight (lbs) 198 lb 10.2 oz 217 lb 210 lb 1.6 oz  Weight (kg) 90.1 kg 98.431 kg 95.3 kg      Telemetry/ECG  SR, no SVT or WCT - Personally Reviewed  Physical Exam  General: Well developed, in no acute distress.  Neck: No JVD.  Cardiac: Normal rate, regular rhythm.  Resp: Normal work of breathing.  Ext: No edema.  Neuro: No gross focal deficits.  Psych: Normal affect.   Assessment & Plan  #Wide-complex tachycardia: Suspect recurrence of his left posterolateral pathway.   -No other AAD therapy options given his low LVEF.  If recurrent despite treatment with amiodarone  favor EP study and ablation.  This has been discussed with the patient who is in agreement.  Risks of Amio have also been discussed with the patient.  Not an ideal solution for him long-term but given his low LVEF and substance abuse history it is the most reasonable option at present. -Transitioned to oral amiodarone  yesterday. No recurrence on telemetry. Continue amiodarone  400mg  twice daily for 14 total days then 200mg  once daily thereafter.    #Chest pain #NSTEMI:  -Likely type II in setting SVT and known severe stenosis of a septal branch  (large). Management of SVT as above. Completed 48 hours of heparin .    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 Probably substance abuse related. -Resume home carvedilol  3.125mg  BID, losartan  12.5mg  daily, lasix  20mg  daily.    #Cocaine, amphetamines, marijuana, and alcohol abuse history -Management per primary.  Okay to discharge from EP perspective.    Signed, Fonda Kitty, MD

## 2024-11-17 NOTE — Discharge Instructions (Signed)
 Dear Bobby Horn,  Thank you for letting us  participate in your care. You were hospitalized for chest pain and diagnosed with NSTEMI (non-ST elevated myocardial infarction) (HCC). You were treated with oral medications.   POST-HOSPITAL & CARE INSTRUCTIONS We have stared you on a new medication, please take as prescribed: AMIODARONE  400MG  TWICE A DAY FROM 11/30 - 12/13, THEN 200MG  DAILY FROM 12/14 AND ON. Please follow up with your electrophysiologist as scheduled to keep a close monitor while you are on this medication. Go to your follow up appointments (listed below):  DOCTOR'S APPOINTMENT   Future Appointments  Date Time Provider Department Center  12/18/2024 10:40 AM Lesia Ozell Barter, PA-C [facility]    Take care and be well!  Family Medicine Teaching Service Inpatient Team [place]  [facility]  [address] [place], [place] [address] [phone]

## 2024-11-17 NOTE — Progress Notes (Signed)
 RN gave discharge instructions to patient. Educated on the importance of following medication regimen and follow up appointments.  Patient has no further questions and is ready to get discharged.

## 2024-11-17 NOTE — Discharge Summary (Addendum)
 Family Medicine Teaching [facility] Discharge Summary  Patient name: Bobby Horn Medical record number: [id number] Date of birth: [birth year] Age: 58 y.o. Gender: male Date of Admission: 11/14/2024  Date of Discharge: 11/17/2024 Admitting Physician: Payton Coward, MD  Primary Care Provider: Pcp, No Consultants: Cardiology  Indication for Hospitalization: NSTEMI in the setting of SVT secondary to severe stenosis of septal branch.   Brief Hospital Course:  JERIMIE MANCUSO is a 58 y.o. year old with a history of A fib s/p ablation, SVT, NSTEMI (10/20/2024), CHF, SVT, alcohol use, tobacco use, OUD on suboxone , HTN, who presented with chest pain and was admitted to the [facility] Medicine Teaching Service for NSTEMI in the setting of arrhythmia. His hospital course is as below:  NSTEMI SVT Patient recently hospitalized 11/2-11/4 for NSTEMI. He underwent LHC and RHC without recommendation of PCI indicated at the time and treated with medical management. Patient presented to the [facility] ED on 11/27 with primary complaint of chest pain since ~9 AM, and worsening. In the ED, an EKG was performed which was indicative of new T wave inversions. Cardiology was consulted and heart catheterization was not recommended at that time. The NSTEMI was suspected to be related to his arrhythmia and longstanding history of SVT. Per cardiology, patient received aspirin , 24 hours of IV amiodarone , and completed 48 hours of heparin .  Cardiology transition patient to oral amiodarone  and monitored for 24 hours prior to discharge. Patient tolerated oral amiodarone  without recurrence of arrhythmia. Pain was controlled adequately throughout hospitalization with medication. Transitioned to home meds as able. Patient was discharged on a cardiac regimen of amiodarone  400mg  BID for total 14 days then 200mg  every day after, carvedilol  3.125mg  BID, losartan  12.5mg  daily, and furosemide  20mg  daily.  Systolic heart failure EF  of <69% via echo done 11/2. No sign of volume overload and BNP of 264 on admission, low concern for heart failure exacerbation. GDMT was restarted as able. Cardiology involvement and follow up as above.   Alcohol use Patient reported 1 pint every 2 days of alcohol with last drink 11/25. CIWAs were monitored during admission and patient did not exhibit signs or symptoms of alcohol withdrawal.  Other chronic conditions were medically managed with home medications and formulary alternatives as necessary (cocaine use, HTN, HLD, chronic pain, opioid use disorder).  PCP Follow-up Recommendations: Encourage abstinence from alcohol, cocaine Ensure follow-up with EP outpatient  Discharge Diagnoses/Problem List:  [facility]     * (Principal) NSTEMI (non-ST elevated myocardial infarction) (HCC)     Acute systolic heart failure (HCC)     Alcohol use     Cocaine use     Elevated Serum creatinine     V tach (HCC)   Disposition: Home  Discharge Condition: Stable  Discharge Exam:  Gen: well appearing, smiling, lying comfortably supine in hospital bed in no acute distress CV: RRR, no m/r/g Resp: CTAB, good respiratory effort, good air movement throughout all lung fields, normal WOB on RA Abd: soft, non-tender, non-distended, BS present Ext: no peripheral edema Neuro: alert and oriented x3, normal speech Psych: appropriate mood and affect  Significant Labs and Imaging:  Recent Labs  Lab 11/16/24 0318  WBC 4.8  HGB 15.8  HCT 44.9  PLT 146*   Recent Labs  Lab 11/16/24 0318 11/17/24 0315  NA 136 136  K 4.0 3.6  CL 106 104  CO2 23 23  GLUCOSE 92 112*  BUN 13 13  CREATININE 1.25* 1.29*  CALCIUM  8.1* 8.2*  MG 1.8 1.8   CXR 11/27: No active cardiopulmonary disease.  Discharge Medications:  Allergies as of 11/17/2024   No Known Allergies      Medication List     TAKE these medications    amiodarone  400 MG tablet Commonly known as: PACERONE  Take 1  tablet (400 mg total) by mouth 2 (two) times daily for 13 days, THEN 0.5 tablets (200 mg total) daily. Start taking on: November 17, 2024   aspirin  81 MG chewable tablet Chew 1 tablet (81 mg total) by mouth daily.   atorvastatin  80 MG tablet Commonly known as: LIPITOR  Take 1 tablet (80 mg total) by mouth daily.   carvedilol  3.125 MG tablet Commonly known as: COREG  Take 1 tablet (3.125 mg total) by mouth 2 (two) times daily with a meal.   clopidogrel  75 MG tablet Commonly known as: PLAVIX  Take 1 tablet (75 mg total) by mouth daily with breakfast.   furosemide  20 MG tablet Commonly known as: LASIX  Take 1 tablet (20 mg total) by mouth daily.   ibuprofen  800 MG tablet Commonly known as: ADVIL  Take 1 tablet (800 mg total) by mouth every 8 (eight) hours as needed for mild pain (pain score 1-3) or moderate pain (pain score 4-6). What changed: how much to take   losartan  25 MG tablet Commonly known as: COZAAR  Take 0.5 tablets (12.5 mg total) by mouth daily.   multivitamin with minerals Tabs tablet Take 1 tablet by mouth daily. Start taking on: November 18, 2024   Suboxone  8-2 MG Film Generic drug: Buprenorphine  HCl-Naloxone  HCl Take 1 Film by mouth in the morning, at noon, and at bedtime.   thiamine  100 MG tablet Commonly known as: Vitamin B-1 Take 1 tablet (100 mg total) by mouth daily.   zolpidem  10 MG tablet Commonly known as: AMBIEN  Take 10 mg by mouth at bedtime.       Discharge Instructions: Please refer to Patient Instructions section of EMR for full details.  Patient was counseled important signs and symptoms that should prompt return to medical care, changes in medications, dietary instructions, activity restrictions, and follow up appointments.   Follow-Up Appointments:  Future Appointments  Date Time Provider Department Center  12/18/2024 10:40 AM Lesia Ozell Barter, PA-C [facility]   Lupie Credit, DO 11/17/2024, 12:33 PM PGY-1, [facility]  I have verified that the service and findings are accurately documented in the resident's note above.  Damien Cassis, MD                  11/17/2024, 1:15 PM

## 2024-11-28 ENCOUNTER — Other Ambulatory Visit: Payer: Self-pay | Admitting: Family Medicine

## 2024-12-18 ENCOUNTER — Ambulatory Visit: Admitting: Student
